# Patient Record
Sex: Female | Born: 1935 | Race: White | Hispanic: No | Smoking: Former smoker
Health system: Southern US, Community
[De-identification: ages and names within clinical notes are randomized; demographics above are authoritative.]

## PROBLEM LIST (undated history)

## (undated) DIAGNOSIS — K219 Gastro-esophageal reflux disease without esophagitis: Secondary | ICD-10-CM

## (undated) DIAGNOSIS — N3281 Overactive bladder: Secondary | ICD-10-CM

## (undated) DIAGNOSIS — H6120 Impacted cerumen, unspecified ear: Secondary | ICD-10-CM

## (undated) DIAGNOSIS — G2581 Restless legs syndrome: Secondary | ICD-10-CM

## (undated) DIAGNOSIS — R059 Cough, unspecified: Secondary | ICD-10-CM

## (undated) DIAGNOSIS — K449 Diaphragmatic hernia without obstruction or gangrene: Secondary | ICD-10-CM

## (undated) DIAGNOSIS — E785 Hyperlipidemia, unspecified: Secondary | ICD-10-CM

## (undated) DIAGNOSIS — B029 Zoster without complications: Secondary | ICD-10-CM

## (undated) DIAGNOSIS — R634 Abnormal weight loss: Secondary | ICD-10-CM

## (undated) DIAGNOSIS — I509 Heart failure, unspecified: Secondary | ICD-10-CM

## (undated) DIAGNOSIS — R05 Cough: Secondary | ICD-10-CM

## (undated) DIAGNOSIS — M1990 Unspecified osteoarthritis, unspecified site: Secondary | ICD-10-CM

## (undated) DIAGNOSIS — C801 Malignant (primary) neoplasm, unspecified: Secondary | ICD-10-CM

## (undated) DIAGNOSIS — R197 Diarrhea, unspecified: Secondary | ICD-10-CM

## (undated) DIAGNOSIS — K8689 Other specified diseases of pancreas: Secondary | ICD-10-CM

## (undated) HISTORY — DX: Overactive bladder: N32.81

## (undated) HISTORY — PX: CATARACT EXTRACTION: SUR2

## (undated) HISTORY — DX: Zoster without complications: B02.9

## (undated) HISTORY — DX: Diaphragmatic hernia without obstruction or gangrene: K44.9

## (undated) HISTORY — DX: Unspecified osteoarthritis, unspecified site: M19.90

## (undated) HISTORY — DX: Cough: R05

## (undated) HISTORY — DX: Malignant (primary) neoplasm, unspecified: C80.1

## (undated) HISTORY — DX: Diarrhea, unspecified: R19.7

## (undated) HISTORY — DX: Heart failure, unspecified: I50.9

## (undated) HISTORY — DX: Cough, unspecified: R05.9

## (undated) HISTORY — DX: Gastro-esophageal reflux disease without esophagitis: K21.9

## (undated) HISTORY — DX: Hyperlipidemia, unspecified: E78.5

## (undated) HISTORY — DX: Restless legs syndrome: G25.81

## (undated) HISTORY — DX: Other specified diseases of pancreas: K86.89

## (undated) HISTORY — DX: Impacted cerumen, unspecified ear: H61.20

## (undated) HISTORY — DX: Abnormal weight loss: R63.4

---

## 1998-08-08 ENCOUNTER — Other Ambulatory Visit: Admission: RE | Admit: 1998-08-08 | Discharge: 1998-08-08 | Payer: Self-pay | Admitting: Internal Medicine

## 1999-08-21 ENCOUNTER — Other Ambulatory Visit: Admission: RE | Admit: 1999-08-21 | Discharge: 1999-08-21 | Payer: Self-pay | Admitting: Internal Medicine

## 2000-08-14 ENCOUNTER — Encounter: Payer: Self-pay | Admitting: Family Medicine

## 2000-08-14 ENCOUNTER — Encounter: Admission: RE | Admit: 2000-08-14 | Discharge: 2000-08-14 | Payer: Self-pay | Admitting: Family Medicine

## 2000-10-06 ENCOUNTER — Encounter: Admission: RE | Admit: 2000-10-06 | Discharge: 2000-10-06 | Payer: Self-pay | Admitting: Family Medicine

## 2000-10-06 ENCOUNTER — Encounter: Payer: Self-pay | Admitting: Family Medicine

## 2001-08-24 ENCOUNTER — Encounter: Payer: Self-pay | Admitting: Family Medicine

## 2001-08-24 ENCOUNTER — Encounter: Admission: RE | Admit: 2001-08-24 | Discharge: 2001-08-24 | Payer: Self-pay | Admitting: Family Medicine

## 2002-09-14 ENCOUNTER — Encounter: Payer: Self-pay | Admitting: Family Medicine

## 2002-09-14 ENCOUNTER — Encounter: Admission: RE | Admit: 2002-09-14 | Discharge: 2002-09-14 | Payer: Self-pay | Admitting: Family Medicine

## 2002-10-11 ENCOUNTER — Other Ambulatory Visit: Admission: RE | Admit: 2002-10-11 | Discharge: 2002-10-11 | Payer: Self-pay | Admitting: Family Medicine

## 2002-11-01 ENCOUNTER — Encounter: Payer: Self-pay | Admitting: Family Medicine

## 2002-11-01 ENCOUNTER — Encounter: Admission: RE | Admit: 2002-11-01 | Discharge: 2002-11-01 | Payer: Self-pay | Admitting: Family Medicine

## 2003-09-16 ENCOUNTER — Encounter: Payer: Self-pay | Admitting: Family Medicine

## 2003-09-16 ENCOUNTER — Encounter: Admission: RE | Admit: 2003-09-16 | Discharge: 2003-09-16 | Payer: Self-pay | Admitting: Family Medicine

## 2004-09-17 ENCOUNTER — Encounter: Admission: RE | Admit: 2004-09-17 | Discharge: 2004-09-17 | Payer: Self-pay | Admitting: Family Medicine

## 2004-10-16 ENCOUNTER — Ambulatory Visit: Payer: Self-pay | Admitting: Family Medicine

## 2004-11-09 ENCOUNTER — Ambulatory Visit: Payer: Self-pay | Admitting: Family Medicine

## 2004-11-09 ENCOUNTER — Encounter: Admission: RE | Admit: 2004-11-09 | Discharge: 2004-11-09 | Payer: Self-pay | Admitting: Family Medicine

## 2005-01-23 ENCOUNTER — Ambulatory Visit: Payer: Self-pay | Admitting: Family Medicine

## 2005-10-04 ENCOUNTER — Ambulatory Visit: Payer: Self-pay | Admitting: Internal Medicine

## 2005-10-09 ENCOUNTER — Encounter: Admission: RE | Admit: 2005-10-09 | Discharge: 2005-10-09 | Payer: Self-pay | Admitting: Family Medicine

## 2005-10-21 ENCOUNTER — Ambulatory Visit: Payer: Self-pay | Admitting: Family Medicine

## 2005-10-21 ENCOUNTER — Other Ambulatory Visit: Admission: RE | Admit: 2005-10-21 | Discharge: 2005-10-21 | Payer: Self-pay | Admitting: Family Medicine

## 2005-10-21 ENCOUNTER — Encounter: Payer: Self-pay | Admitting: Family Medicine

## 2005-10-21 LAB — CONVERTED CEMR LAB: Pap Smear: NORMAL

## 2005-11-19 ENCOUNTER — Ambulatory Visit: Payer: Self-pay | Admitting: Family Medicine

## 2006-09-17 ENCOUNTER — Ambulatory Visit: Payer: Self-pay | Admitting: Family Medicine

## 2006-10-22 ENCOUNTER — Ambulatory Visit: Payer: Self-pay | Admitting: Family Medicine

## 2006-10-27 ENCOUNTER — Encounter: Admission: RE | Admit: 2006-10-27 | Discharge: 2006-10-27 | Payer: Self-pay | Admitting: Family Medicine

## 2006-10-28 LAB — FECAL OCCULT BLOOD, GUAIAC: Fecal Occult Blood: NEGATIVE

## 2006-11-04 ENCOUNTER — Ambulatory Visit: Payer: Self-pay | Admitting: Family Medicine

## 2006-12-08 ENCOUNTER — Ambulatory Visit: Payer: Self-pay | Admitting: Family Medicine

## 2007-06-02 ENCOUNTER — Telehealth (INDEPENDENT_AMBULATORY_CARE_PROVIDER_SITE_OTHER): Payer: Self-pay | Admitting: *Deleted

## 2007-06-08 ENCOUNTER — Telehealth (INDEPENDENT_AMBULATORY_CARE_PROVIDER_SITE_OTHER): Payer: Self-pay | Admitting: *Deleted

## 2007-09-18 ENCOUNTER — Ambulatory Visit: Payer: Self-pay | Admitting: Family Medicine

## 2007-09-23 ENCOUNTER — Encounter: Payer: Self-pay | Admitting: Family Medicine

## 2007-09-23 DIAGNOSIS — M1990 Unspecified osteoarthritis, unspecified site: Secondary | ICD-10-CM | POA: Insufficient documentation

## 2007-09-23 DIAGNOSIS — E7800 Pure hypercholesterolemia, unspecified: Secondary | ICD-10-CM

## 2007-09-23 DIAGNOSIS — M810 Age-related osteoporosis without current pathological fracture: Secondary | ICD-10-CM

## 2007-10-10 HISTORY — PX: CATARACT EXTRACTION: SUR2

## 2007-10-26 ENCOUNTER — Ambulatory Visit: Payer: Self-pay | Admitting: Family Medicine

## 2007-10-26 DIAGNOSIS — G2581 Restless legs syndrome: Secondary | ICD-10-CM | POA: Insufficient documentation

## 2007-10-26 DIAGNOSIS — Z860100 Personal history of colon polyps, unspecified: Secondary | ICD-10-CM | POA: Insufficient documentation

## 2007-10-27 LAB — CONVERTED CEMR LAB
ALT: 17 units/L (ref 0–35)
AST: 25 units/L (ref 0–37)
Albumin: 4.2 g/dL (ref 3.5–5.2)
BUN: 17 mg/dL (ref 6–23)
Basophils Absolute: 0 10*3/uL (ref 0.0–0.1)
Basophils Relative: 0.2 % (ref 0.0–1.0)
CO2: 25 meq/L (ref 19–32)
Calcium: 9.7 mg/dL (ref 8.4–10.5)
Chloride: 108 meq/L (ref 96–112)
Cholesterol: 214 mg/dL (ref 0–200)
Creatinine, Ser: 0.7 mg/dL (ref 0.4–1.2)
Direct LDL: 123.9 mg/dL
Eosinophils Absolute: 0.1 10*3/uL (ref 0.0–0.6)
Eosinophils Relative: 1.3 % (ref 0.0–5.0)
GFR calc Af Amer: 104 mL/min
GFR calc non Af Amer: 86 mL/min
Glucose, Bld: 108 mg/dL — ABNORMAL HIGH (ref 70–99)
HCT: 36.4 % (ref 36.0–46.0)
HDL: 71.5 mg/dL (ref >39.0)
Hemoglobin: 12.6 g/dL (ref 12.0–15.0)
Lymphocytes Relative: 39.9 % (ref 12.0–46.0)
MCHC: 34.8 g/dL (ref 30.0–36.0)
MCV: 89.5 fL (ref 78.0–100.0)
Monocytes Absolute: 0.3 10*3/uL (ref 0.2–0.7)
Monocytes Relative: 7.1 % (ref 3.0–11.0)
Neutro Abs: 2.3 10*3/uL (ref 1.4–7.7)
Neutrophils Relative %: 51.5 % (ref 43.0–77.0)
Phosphorus: 3.4 mg/dL (ref 2.3–4.6)
Platelets: 202 10*3/uL (ref 150–400)
Potassium: 4.3 meq/L (ref 3.5–5.1)
RBC: 4.06 M/uL (ref 3.87–5.11)
RDW: 13.8 % (ref 11.5–14.6)
Sodium: 142 meq/L (ref 135–145)
TSH: 3.35 microintl units/mL (ref 0.35–5.50)
Total CHOL/HDL Ratio: 3
Triglycerides: 76 mg/dL (ref 0–149)
VLDL: 15 mg/dL (ref 0–40)
WBC: 4.5 10*3/uL (ref 4.5–10.5)

## 2007-10-28 ENCOUNTER — Encounter (INDEPENDENT_AMBULATORY_CARE_PROVIDER_SITE_OTHER): Payer: Self-pay | Admitting: *Deleted

## 2007-10-28 LAB — CONVERTED CEMR LAB: Vit D, 1,25-Dihydroxy: 27 — ABNORMAL LOW (ref 30–89)

## 2007-11-19 ENCOUNTER — Encounter: Admission: RE | Admit: 2007-11-19 | Discharge: 2007-11-19 | Payer: Self-pay | Admitting: Family Medicine

## 2007-11-24 ENCOUNTER — Encounter (INDEPENDENT_AMBULATORY_CARE_PROVIDER_SITE_OTHER): Payer: Self-pay | Admitting: *Deleted

## 2007-12-14 ENCOUNTER — Ambulatory Visit: Payer: Self-pay | Admitting: Family Medicine

## 2007-12-15 ENCOUNTER — Encounter: Payer: Self-pay | Admitting: Gastroenterology

## 2007-12-15 ENCOUNTER — Ambulatory Visit: Payer: Self-pay | Admitting: Gastroenterology

## 2007-12-18 LAB — CONVERTED CEMR LAB: Vit D, 1,25-Dihydroxy: 25 — ABNORMAL LOW (ref 30–89)

## 2007-12-25 ENCOUNTER — Encounter: Payer: Self-pay | Admitting: Family Medicine

## 2007-12-25 ENCOUNTER — Ambulatory Visit: Payer: Self-pay | Admitting: Gastroenterology

## 2007-12-25 DIAGNOSIS — K5730 Diverticulosis of large intestine without perforation or abscess without bleeding: Secondary | ICD-10-CM | POA: Insufficient documentation

## 2008-02-01 ENCOUNTER — Ambulatory Visit: Payer: Self-pay | Admitting: Family Medicine

## 2008-02-04 LAB — CONVERTED CEMR LAB: Vit D, 1,25-Dihydroxy: 45 (ref 30–89)

## 2008-04-25 ENCOUNTER — Ambulatory Visit: Payer: Self-pay | Admitting: Family Medicine

## 2008-04-27 LAB — CONVERTED CEMR LAB: Vit D, 1,25-Dihydroxy: 34 (ref 30–89)

## 2008-06-06 ENCOUNTER — Telehealth: Payer: Self-pay | Admitting: Family Medicine

## 2008-07-28 ENCOUNTER — Ambulatory Visit: Payer: Self-pay | Admitting: Family Medicine

## 2008-08-01 LAB — CONVERTED CEMR LAB: Vit D, 1,25-Dihydroxy: 27 — ABNORMAL LOW (ref 30–89)

## 2008-09-06 ENCOUNTER — Ambulatory Visit: Payer: Self-pay | Admitting: Family Medicine

## 2008-10-10 ENCOUNTER — Ambulatory Visit: Payer: Self-pay | Admitting: Family Medicine

## 2008-10-12 LAB — CONVERTED CEMR LAB: Vit D, 1,25-Dihydroxy: 55 (ref 30–89)

## 2008-10-31 ENCOUNTER — Ambulatory Visit: Payer: Self-pay | Admitting: Family Medicine

## 2008-10-31 DIAGNOSIS — E559 Vitamin D deficiency, unspecified: Secondary | ICD-10-CM

## 2008-11-02 LAB — CONVERTED CEMR LAB
ALT: 14 units/L (ref 0–35)
AST: 22 units/L (ref 0–37)
Albumin: 4.1 g/dL (ref 3.5–5.2)
Alkaline Phosphatase: 31 units/L — ABNORMAL LOW (ref 39–117)
BUN: 13 mg/dL (ref 6–23)
Basophils Absolute: 0 10*3/uL (ref 0.0–0.1)
Basophils Relative: 0.3 % (ref 0.0–3.0)
Bilirubin, Direct: 0.1 mg/dL (ref 0.0–0.3)
CO2: 30 meq/L (ref 19–32)
Calcium: 9.6 mg/dL (ref 8.4–10.5)
Chloride: 105 meq/L (ref 96–112)
Cholesterol: 205 mg/dL (ref 0–200)
Creatinine, Ser: 0.8 mg/dL (ref 0.4–1.2)
Direct LDL: 100.2 mg/dL
Eosinophils Absolute: 0.1 10*3/uL (ref 0.0–0.7)
Eosinophils Relative: 1.6 % (ref 0.0–5.0)
GFR calc Af Amer: 89 mL/min
GFR calc non Af Amer: 73 mL/min
Glucose, Bld: 100 mg/dL — ABNORMAL HIGH (ref 70–99)
HCT: 36.6 % (ref 36.0–46.0)
HDL: 83.9 mg/dL (ref >39.0)
Hemoglobin: 12.7 g/dL (ref 12.0–15.0)
Lymphocytes Relative: 44.1 % (ref 12.0–46.0)
MCHC: 34.6 g/dL (ref 30.0–36.0)
MCV: 90.7 fL (ref 78.0–100.0)
Monocytes Absolute: 0.4 10*3/uL (ref 0.1–1.0)
Monocytes Relative: 9 % (ref 3.0–12.0)
Neutro Abs: 1.8 10*3/uL (ref 1.4–7.7)
Neutrophils Relative %: 45 % (ref 43.0–77.0)
Phosphorus: 3.9 mg/dL (ref 2.3–4.6)
Platelets: 187 10*3/uL (ref 150–400)
Potassium: 4 meq/L (ref 3.5–5.1)
RBC: 4.03 M/uL (ref 3.87–5.11)
RDW: 13.1 % (ref 11.5–14.6)
Sodium: 141 meq/L (ref 135–145)
TSH: 4.6 microintl units/mL (ref 0.35–5.50)
Total Bilirubin: 0.4 mg/dL (ref 0.3–1.2)
Total CHOL/HDL Ratio: 2.4
Total Protein: 7.2 g/dL (ref 6.0–8.3)
Triglycerides: 75 mg/dL (ref 0–149)
VLDL: 15 mg/dL (ref 0–40)
WBC: 4 10*3/uL — ABNORMAL LOW (ref 4.5–10.5)

## 2008-11-21 ENCOUNTER — Encounter: Admission: RE | Admit: 2008-11-21 | Discharge: 2008-11-21 | Payer: Self-pay | Admitting: Family Medicine

## 2009-01-23 ENCOUNTER — Ambulatory Visit: Payer: Self-pay | Admitting: Family Medicine

## 2009-01-31 ENCOUNTER — Encounter (INDEPENDENT_AMBULATORY_CARE_PROVIDER_SITE_OTHER): Payer: Self-pay | Admitting: *Deleted

## 2009-01-31 LAB — CONVERTED CEMR LAB: Vit D, 25-Hydroxy: 36 ng/mL (ref 30–89)

## 2009-06-07 ENCOUNTER — Telehealth: Payer: Self-pay | Admitting: Family Medicine

## 2009-07-24 ENCOUNTER — Ambulatory Visit: Payer: Self-pay | Admitting: Family Medicine

## 2009-07-31 LAB — CONVERTED CEMR LAB: Vit D, 25-Hydroxy: 30 ng/mL (ref 30–89)

## 2009-08-30 ENCOUNTER — Ambulatory Visit: Payer: Self-pay | Admitting: Family Medicine

## 2009-09-05 ENCOUNTER — Ambulatory Visit: Payer: Self-pay | Admitting: Family Medicine

## 2009-09-12 ENCOUNTER — Telehealth: Payer: Self-pay | Admitting: Family Medicine

## 2009-10-30 ENCOUNTER — Ambulatory Visit: Payer: Self-pay | Admitting: Family Medicine

## 2009-10-31 ENCOUNTER — Encounter (INDEPENDENT_AMBULATORY_CARE_PROVIDER_SITE_OTHER): Payer: Self-pay | Admitting: *Deleted

## 2009-10-31 LAB — CONVERTED CEMR LAB: Vit D, 25-Hydroxy: 59 ng/mL (ref 30–89)

## 2009-11-06 ENCOUNTER — Ambulatory Visit: Payer: Self-pay | Admitting: Family Medicine

## 2009-11-06 DIAGNOSIS — K219 Gastro-esophageal reflux disease without esophagitis: Secondary | ICD-10-CM

## 2009-11-07 LAB — CONVERTED CEMR LAB
BUN: 14 mg/dL (ref 6–23)
Basophils Absolute: 0 10*3/uL (ref 0.0–0.1)
Basophils Relative: 0.4 % (ref 0.0–3.0)
CO2: 28 meq/L (ref 19–32)
Calcium: 9.3 mg/dL (ref 8.4–10.5)
Chloride: 105 meq/L (ref 96–112)
Creatinine, Ser: 0.7 mg/dL (ref 0.4–1.2)
Eosinophils Absolute: 0 10*3/uL (ref 0.0–0.7)
Eosinophils Relative: 1.1 % (ref 0.0–5.0)
GFR calc non Af Amer: 85.08 mL/min (ref >60)
Glucose, Bld: 104 mg/dL — ABNORMAL HIGH (ref 70–99)
HCT: 36.8 % (ref 36.0–46.0)
Hemoglobin: 12.4 g/dL (ref 12.0–15.0)
Lymphocytes Relative: 42.2 % (ref 12.0–46.0)
Lymphs Abs: 1.8 10*3/uL (ref 0.7–4.0)
MCHC: 33.8 g/dL (ref 30.0–36.0)
MCV: 92.2 fL (ref 78.0–100.0)
Monocytes Absolute: 0.4 10*3/uL (ref 0.1–1.0)
Monocytes Relative: 8.3 % (ref 3.0–12.0)
Neutro Abs: 2.1 10*3/uL (ref 1.4–7.7)
Neutrophils Relative %: 48 % (ref 43.0–77.0)
Platelets: 204 10*3/uL (ref 150.0–400.0)
Potassium: 3.9 meq/L (ref 3.5–5.1)
RBC: 3.99 M/uL (ref 3.87–5.11)
RDW: 13.8 % (ref 11.5–14.6)
Sodium: 141 meq/L (ref 135–145)
TSH: 3.84 microintl units/mL (ref 0.35–5.50)
WBC: 4.3 10*3/uL — ABNORMAL LOW (ref 4.5–10.5)

## 2009-12-06 ENCOUNTER — Encounter: Admission: RE | Admit: 2009-12-06 | Discharge: 2009-12-06 | Payer: Self-pay | Admitting: Family Medicine

## 2009-12-12 ENCOUNTER — Encounter (INDEPENDENT_AMBULATORY_CARE_PROVIDER_SITE_OTHER): Payer: Self-pay | Admitting: *Deleted

## 2010-03-14 ENCOUNTER — Ambulatory Visit: Payer: Self-pay | Admitting: Family Medicine

## 2010-03-14 DIAGNOSIS — N318 Other neuromuscular dysfunction of bladder: Secondary | ICD-10-CM | POA: Insufficient documentation

## 2010-03-14 LAB — CONVERTED CEMR LAB
Bacteria, UA: 0
Bilirubin Urine: NEGATIVE
Blood in Urine, dipstick: NEGATIVE
Casts: 0 /lpf
Glucose, Urine, Semiquant: NEGATIVE
Ketones, urine, test strip: NEGATIVE
Nitrite: NEGATIVE
Protein, U semiquant: NEGATIVE
RBC / HPF: 1
Specific Gravity, Urine: 1.015
Urine crystals, microscopic: 0 /hpf
Urobilinogen, UA: 0.2
WBC Urine, dipstick: NEGATIVE
WBC, UA: 0 cells/hpf
Yeast, UA: 0
pH: 5

## 2010-04-09 ENCOUNTER — Telehealth: Payer: Self-pay | Admitting: Family Medicine

## 2010-04-11 ENCOUNTER — Ambulatory Visit: Payer: Self-pay | Admitting: Family Medicine

## 2010-04-13 ENCOUNTER — Telehealth: Payer: Self-pay | Admitting: Family Medicine

## 2010-07-18 ENCOUNTER — Telehealth: Payer: Self-pay | Admitting: Family Medicine

## 2010-08-09 ENCOUNTER — Telehealth: Payer: Self-pay | Admitting: Family Medicine

## 2010-08-09 DIAGNOSIS — H9319 Tinnitus, unspecified ear: Secondary | ICD-10-CM | POA: Insufficient documentation

## 2010-09-04 ENCOUNTER — Encounter: Payer: Self-pay | Admitting: Family Medicine

## 2010-10-09 ENCOUNTER — Encounter: Payer: Self-pay | Admitting: Family Medicine

## 2010-11-06 ENCOUNTER — Telehealth (INDEPENDENT_AMBULATORY_CARE_PROVIDER_SITE_OTHER): Payer: Self-pay | Admitting: *Deleted

## 2010-11-07 ENCOUNTER — Ambulatory Visit: Payer: Self-pay | Admitting: Family Medicine

## 2010-11-07 LAB — CONVERTED CEMR LAB
ALT: 13 units/L (ref 0–35)
AST: 19 units/L (ref 0–37)
Albumin: 4.2 g/dL (ref 3.5–5.2)
Alkaline Phosphatase: 37 units/L — ABNORMAL LOW (ref 39–117)
BUN: 21 mg/dL (ref 6–23)
Basophils Absolute: 0 10*3/uL (ref 0.0–0.1)
Basophils Relative: 0.2 % (ref 0.0–3.0)
Bilirubin, Direct: 0.1 mg/dL (ref 0.0–0.3)
CO2: 29 meq/L (ref 19–32)
Calcium: 9.6 mg/dL (ref 8.4–10.5)
Chloride: 104 meq/L (ref 96–112)
Cholesterol: 236 mg/dL — ABNORMAL HIGH (ref 0–200)
Creatinine, Ser: 0.9 mg/dL (ref 0.4–1.2)
Direct LDL: 128 mg/dL
Eosinophils Absolute: 0.1 10*3/uL (ref 0.0–0.7)
Eosinophils Relative: 2 % (ref 0.0–5.0)
GFR calc non Af Amer: 66.04 mL/min (ref >60)
Glucose, Bld: 103 mg/dL — ABNORMAL HIGH (ref 70–99)
HCT: 37.1 % (ref 36.0–46.0)
HDL: 82.4 mg/dL (ref >39.00)
Hemoglobin: 12.7 g/dL (ref 12.0–15.0)
Lymphocytes Relative: 43.4 % (ref 12.0–46.0)
Lymphs Abs: 1.6 10*3/uL (ref 0.7–4.0)
MCHC: 34.2 g/dL (ref 30.0–36.0)
MCV: 90.8 fL (ref 78.0–100.0)
Monocytes Absolute: 0.3 10*3/uL (ref 0.1–1.0)
Monocytes Relative: 8.8 % (ref 3.0–12.0)
Neutro Abs: 1.7 10*3/uL (ref 1.4–7.7)
Neutrophils Relative %: 45.6 % (ref 43.0–77.0)
Phosphorus: 3.6 mg/dL (ref 2.3–4.6)
Platelets: 193 10*3/uL (ref 150.0–400.0)
Potassium: 4.3 meq/L (ref 3.5–5.1)
RBC: 4.09 M/uL (ref 3.87–5.11)
RDW: 14.7 % — ABNORMAL HIGH (ref 11.5–14.6)
Sodium: 142 meq/L (ref 135–145)
TSH: 6.7 microintl units/mL — ABNORMAL HIGH (ref 0.35–5.50)
Total Bilirubin: 0.6 mg/dL (ref 0.3–1.2)
Total CHOL/HDL Ratio: 3
Total Protein: 7 g/dL (ref 6.0–8.3)
Triglycerides: 69 mg/dL (ref 0.0–149.0)
VLDL: 13.8 mg/dL (ref 0.0–40.0)
WBC: 3.6 10*3/uL — ABNORMAL LOW (ref 4.5–10.5)

## 2010-11-11 LAB — CONVERTED CEMR LAB: Vit D, 25-Hydroxy: 46 ng/mL (ref 30–89)

## 2010-11-15 ENCOUNTER — Telehealth: Payer: Self-pay | Admitting: Family Medicine

## 2010-11-16 ENCOUNTER — Telehealth (INDEPENDENT_AMBULATORY_CARE_PROVIDER_SITE_OTHER): Payer: Self-pay

## 2010-11-19 ENCOUNTER — Telehealth: Payer: Self-pay | Admitting: Family Medicine

## 2010-11-23 ENCOUNTER — Ambulatory Visit: Payer: Self-pay | Admitting: Family Medicine

## 2010-12-24 ENCOUNTER — Encounter
Admission: RE | Admit: 2010-12-24 | Discharge: 2010-12-24 | Payer: Self-pay | Source: Home / Self Care | Attending: Family Medicine | Admitting: Family Medicine

## 2010-12-24 LAB — HM MAMMOGRAPHY: HM Mammogram: NORMAL

## 2010-12-26 ENCOUNTER — Encounter: Payer: Self-pay | Admitting: Family Medicine

## 2010-12-26 ENCOUNTER — Encounter (INDEPENDENT_AMBULATORY_CARE_PROVIDER_SITE_OTHER): Payer: Self-pay | Admitting: *Deleted

## 2010-12-30 ENCOUNTER — Encounter: Payer: Self-pay | Admitting: Family Medicine

## 2011-01-08 NOTE — Consult Note (Signed)
Summary: [facility]  [facility]   Imported By: Maryln Gottron 09/10/2010 15:07:11  _____________________________________________________________________  External Attachment:    Type:   Image     Comment:   External Document

## 2011-01-08 NOTE — Progress Notes (Signed)
Summary: Escript failed  ---- Converted from flag ---- ---- 11/15/2010 3:28 PM, Lewanda Rife LPN wrote: Failed eScript: Occupational hygienist;  Internal processing error has occurred. Message id - [id number]. Dr Name - Milinda Antis, Colon Flattery. Pharmacy Name - [facility]*;  Pharmacy: [facility]*;  Medication: MOBIC 7.5 MG  TABS;  Instructions: 1 by mouth once daily with food as needed;  Quantity: 30 Tablet;  Refills: 5;  Entry Date: 2010-11-15;  Transaction ID: [id number];   ------------------------------

## 2011-01-08 NOTE — Letter (Signed)
Summary: Personal Health History Form  Personal Health History Form   Imported By: Maryln Gottron 10/15/2010 10:49:30  _____________________________________________________________________  External Attachment:    Type:   Image     Comment:   External Document

## 2011-01-08 NOTE — Assessment & Plan Note (Signed)
Summary: RINGING IN EARS,BLADDER/CLE   Vital Signs:  Patient profile:   77 year old female Height:      60.5 inches Weight:      148.25 pounds BMI:     28.58 Temp:     98.3 degrees F oral Pulse rate:   72 / minute Pulse rhythm:   regular BP sitting:   136 / 78  (left arm) Cuff size:   regular  Vitals Entered By: Lewanda Rife LPN (March 14, 1911 2:12 PM) CC: ringing in ears and frequency of urine at night up 2 and 3 times during night to urinate.   History of Present Illness: her ear is ringing - and is using debrox at home for wax  none has come out -- at least not much L ear - not hurting - no pain at all  just high pitched constant sound -- occ it comes and goes   is urinating a lot at night lately 2-3 times per night  is a good with drinking water  no burning or pain or discomfort   no leaking at all - is greatful for this   otherwise feeling good has been out working in the yard   ua today is clear   Allergies: 1)  Penicillin 2)  Hydrocodone 3)  Nsaids  Past History:  Past Surgical History: Last updated: 12/26/2007 Dexa- osteoporosis (1997) Dexa- OP, some improvement (09/2000) Cataract extraction Colonoscopy- diverticulosis, polyps (12/2002) Dexa (10/2002) Dexa- OP, slight improvement (11/2004) 2nd cataract 11/08 1/09 colonoscopy - diverticulosis (re check 10 y)  Family History: Last updated: 10/31/2008 Siblings: 2 brothers with heart problems, one had CABG, one with pacemaker, one with DM.  sister with DM, HTN brother DM neice with breast ca daughter fibromyalgia  son ETOH   Social History: Last updated: 10/31/2008 Marital Status:widowed Children: 3 Occupation: retired exercises at [employer] Patient has never smoked.  Alcohol Use - no  Risk Factors: Smoking Status: never (12/15/2007)  Past Medical History: Osteoarthritis Osteoporosis hyperlipidemia RLS vitamin D deficiency GERD overactive bladder cerumen impaction recurrent   Review  of Systems General:  Denies chills, fatigue, fever, loss of appetite, and malaise. Eyes:  Denies blurring, discharge, and eye irritation. ENT:  Complains of decreased hearing, ear discharge, postnasal drainage, and ringing in ears; denies earache. CV:  Denies chest pain or discomfort and palpitations. Resp:  Denies cough and wheezing. GI:  Denies nausea and vomiting. GU:  Complains of nocturia and urinary frequency; denies dysuria, hematuria, and incontinence. Derm:  Denies itching, lesion(s), poor wound healing, and rash. Neuro:  Denies numbness, sensation of room spinning, and tingling. Psych:  Denies anxiety and depression. Endo:  Denies cold intolerance, excessive thirst, and heat intolerance. Heme:  Denies abnormal bruising and bleeding. Allergy:  Complains of seasonal allergies.  Physical Exam  General:  Well-developed,well-nourished,in no acute distress; alert,appropriate and cooperative throughout examination Head:  normocephalic, atraumatic, and no abnormalities observed.  no sinus or temporal tenderness  Eyes:  vision grossly intact, pupils equal, pupils round, and pupils reactive to light.  no conjunctival pallor, injection or icterus  no nystagmus  Ears:  cerumen imp L ear- totally cleared with irrigation canals and TMs clear bilat with imp hearing grossly Nose:  no nasal discharge.   Mouth:  pharynx pink and moist.   Neck:  supple with full rom and no masses or thyromegally, no JVD or carotid bruit  Chest Wall:  No deformities, masses, or tenderness noted. Lungs:  Normal respiratory effort, chest  expands symmetrically. Lungs are clear to auscultation, no crackles or wheezes. Heart:  Normal rate and regular rhythm. S1 and S2 normal without gallop, murmur, click, rub or other extra sounds. Abdomen:  no suprapubic tenderness or fullness felt  Msk:  no CVA tenderness  Extremities:  No clubbing, cyanosis, edema, or deformity noted with normal full range of motion of all joints.    Neurologic:  sensation intact to light touch, gait normal, and DTRs symmetrical and normal.   Skin:  Intact without suspicious lesions or rashes Cervical Nodes:  No lymphadenopathy noted Inguinal Nodes:  No significant adenopathy Psych:  normal affect, talkative and pleasant    Impression & Recommendations:  Problem # 1:  CERUMEN IMPACTION, LEFT (ICD-380.4) Assessment Unchanged resolved after use of debrox and then simple ear irrigation per pt ringing stopped and hearing imp immediately will update if no further imp disc tinnitus and may need ENT eval if symptoms return  Problem # 2:  OVERACTIVE BLADDER (ICD-596.51)  with pm symptoms mainly clear ua disc limiting fluids at night trial of vesicare 5 mg -- samples and px disc poss side eff indetail will update   Orders: UA Dipstick W/ Micro (manual) (19147)  Complete Medication List: 1)  Nexium 40 Mg Cpdr (Esomeprazole magnesium) .Marland Kitchen.. 1 by mouth once daily 2)  Mobic 7.5 Mg Tabs (Meloxicam) .... 2 by mouth once daily with food for 1 week then decrease to 1 daily 3)  Evista 60 Mg Tabs (Raloxifene hcl) .... One by mouth once daily 4)  Vitamin D 4000 Units  .... Take by mouth daily 5)  Calcium  6)  Multivitamins Tabs (Multiple vitamin) .... One daily 7)  Vesicare 5 Mg Tabs (Solifenacin succinate) .Marland Kitchen.. 1 by mouth once daily  Patient Instructions: 1)  take vesicare once daily for overactive bladder (whatever time is fine) 2)  side effects include dry mouth/ constipation  3)  see if it helps bladder  4)  here if px to fill if it works  5)  if ear symptoms do not improve in 1-2 weeks - call so I can refer you to ENT doctor for further evaluation  Prescriptions: VESICARE 5 MG TABS (SOLIFENACIN SUCCINATE) 1 by mouth once daily  #30 x 11    Entered and Authorized by:   Judith Part MD   Signed by:   Judith Part MD on 03/14/2010   Method used:   Print then Give to Patient   RxID:   [id number]   Current Allergies  (reviewed today): PENICILLIN HYDROCODONE NSAIDS  Laboratory Results   Urine Tests  Date/Time Received: March 14, 2010 2:13 PM,  Date/Time Reported: March 14, 2010 2:13 PM   Routine Urinalysis   Color: yellow Appearance: Clear Glucose: negative   (Normal Range: Negative) Bilirubin: negative   (Normal Range: Negative) Ketone: negative   (Normal Range: Negative) Spec. Gravity: 1.015   (Normal Range: 1.003-1.035) Blood: negative   (Normal Range: Negative) pH: 5.0   (Normal Range: 5.0-8.0) Protein: negative   (Normal Range: Negative) Urobilinogen: 0.2   (Normal Range: 0-1) Nitrite: negative   (Normal Range: Negative) Leukocyte Esterace: negative   (Normal Range: Negative)  Urine Microscopic WBC/HPF: 0 RBC/HPF: 1 Bacteria/HPF: 0 Mucous/HPF: few Epithelial/HPF: 1-3 Crystals/HPF: 0 Casts/LPF: 0 Yeast/HPF: 0 Other: 0

## 2011-01-08 NOTE — Progress Notes (Signed)
Summary: allergy sxs  Phone Note Call from Patient Call back at Home Phone [phone]   Caller: Patient Call For: Judith Part MD Summary of Call: Pt has clear nasal drainage.  She has tried claritin but that doesnt help.  She asks what allergy meds she can take.  I advised zyrtec or allegra.  She wanted me to  check with you. Initial call taken by: Lowella Petties CMA,  Apr 09, 2010 5:02 PM  Follow-up for Phone Call        either of these are ok to try  Follow-up by: Judith Part MD,  Apr 09, 2010 5:05 PM

## 2011-01-08 NOTE — Progress Notes (Signed)
Summary: mobic  Phone Note Refill Request Message from:  Fax from Pharmacy on November 15, 2010 10:33 AM  Refills Requested: Medication #1:  MOBIC 7.5 MG  TABS 1 by mouth once daily with food [phone].   Initial call taken by: Melody Comas,  November 15, 2010 11:02 AM  Follow-up for Phone Call        px written on EMR for call in  Follow-up by: Judith Part MD,  November 15, 2010 1:06 PM  Additional Follow-up for Phone Call Additional follow up Details #1::        [facility] instructed.Lewanda Rife LPN  November 15, 2010 3:27 PM     New/Updated Medications: MOBIC 7.5 MG  TABS (MELOXICAM) 1 by mouth once daily with food as needed Prescriptions: MOBIC 7.5 MG  TABS (MELOXICAM) 1 by mouth once daily with food as needed  #30 x 5   Entered by:   Lewanda Rife LPN   Authorized by:   Judith Part MD   Signed by:   Lewanda Rife LPN on 86/57/8469   Method used:   Electronically to        [facility]* (mail-order)       [address]       [place], [place]  [address]       Ph: [phone]       Fax: [phone]   RxID:   [id number]

## 2011-01-08 NOTE — Letter (Signed)
Summary: Results Follow up Letter  [place] [facility]  [address] [place], [place] [address]   Phone: [phone]  Fax: [phone]    12/12/2009 MRN: [id number]    Bridget Walker [address] [place], [place]  [address]    Dear [name],  The following are the results of your recent test(s):  Test         Result    Pap Smear:        Normal _____  Not Normal _____ Comments: ______________________________________________________ Cholesterol: LDL(Bad cholesterol):         Your goal is less than:         HDL (Good cholesterol):       Your goal is more than: Comments:  ______________________________________________________ Mammogram:        Normal __X___  Not Normal _____ Comments:  Yearly follow up is recommended.   ___________________________________________________________________ Hemoccult:        Normal _____  Not normal _______ Comments:    _____________________________________________________________________ Other Tests:    We routinely do not discuss normal results over the telephone.  If you desire a copy of the results, or you have any questions about this information we can discuss them at your next office visit.   Sincerely,    Marne A. Milinda Antis, M.D.  MAT:lsf

## 2011-01-08 NOTE — Progress Notes (Signed)
----   Converted from flag ---- ---- 11/06/2010 7:46 AM, Colon Flattery Tower MD wrote: please check vit D and lipid/ hepatic/ renal / cbc with diff / tsh for gerd, 733.0, vit d def and 272 thanks  ---- 10/30/2010 1:32 PM, Liane Comber CMA (AAMA) wrote: Lab orders please! Good Morning! This pt is scheduled for cpx labs Wed, which labs to draw and dx codes to use? Thanks Tasha ------------------------------

## 2011-01-08 NOTE — Progress Notes (Signed)
Summary: cough has worsened   Phone Note Call from Patient Call back at Home Phone [phone]   Caller: Patient Call For: Judith Part MD Summary of Call: Patient says that her cough has gotten worse and is especially bad while lying down. She is asking if she can have something called in to [facility].  Initial call taken by: Melody Comas,  Apr 13, 2010 4:29 PM  Follow-up for Phone Call        she is allergic to hydrocodone so hesitant to use narcotics will try tessalon- swallow whole and do not chew the pills px written on EMR for call in  Follow-up by: Judith Part MD,  Apr 13, 2010 5:17 PM  Additional Follow-up for Phone Call Additional follow up Details #1::        Patient notified as instructed by telephone. Pt said has had slight wheezing but she feels better. Pt to call next week to update how she is doing. Pt to go to [place] [facility] on Sat aftercalling for appt if needed. [facility] as instructed. Lewanda Rife LPN  Apr 14, 1307 5:26 PM     New/Updated Medications: TESSALON 200 MG CAPS (BENZONATATE) 1 by mouth up to three times a day as needed cough Prescriptions: TESSALON 200 MG CAPS (BENZONATATE) 1 by mouth up to three times a day as needed cough  #30 x 0   Entered and Authorized by:   Judith Part MD   Signed by:   Lewanda Rife LPN on 65/78/4696   Method used:   [facility]phoned to ...       [facility] [facility]retail)       [address]       [place], [place]  [address]       Ph: [phone] or [phone]       Fax: [phone]   RxID:   [id number]

## 2011-01-08 NOTE — Progress Notes (Signed)
Summary: needs written scripts for evista, nexium  Phone Note Refill Request Call back at Home Phone [phone] Message from:  Patient  Refills Requested: Medication #1:  EVISTA 60 MG  TABS one by mouth once daily  Medication #2:  NEXIUM 40 MG  CPDR 1 by mouth once daily Pt needs 90 day written scripts for mail order.  Please call when ready, she wants to pick these up tomorrow morning.  Initial call taken by: Lowella Petties CMA,  July 18, 2010 2:48 PM  Follow-up for Phone Call        printed in put in nurse in box for pickup  Follow-up by: Judith Part MD,  July 19, 2010 12:42 PM  Additional Follow-up for Phone Call Additional follow up Details #1::        Left message for patient to call back. Prescription left at front desk. Lewanda Rife LPN  July 19, 2010 12:52 PM   Patient notified.  Additional Follow-up by: Melody Comas,  July 19, 2010 1:39 PM    New/Updated Medications: NEXIUM 40 MG  CPDR (ESOMEPRAZOLE MAGNESIUM) 1 by mouth once daily Prescriptions: NEXIUM 40 MG  CPDR (ESOMEPRAZOLE MAGNESIUM) 1 by mouth once daily  #90 x 3   Entered and Authorized by:   Judith Part MD   Signed by:   Judith Part MD on 07/19/2010   Method used:   Print then Give to Patient   RxID:   [id number] EVISTA 60 MG  TABS (RALOXIFENE HCL) one by mouth once daily  #90 x 3   Entered and Authorized by:   Judith Part MD   Signed by:   Judith Part MD on 07/19/2010   Method used:   Print then Give to Patient   RxID:   [id number] EVISTA 60 MG  TABS (RALOXIFENE HCL) one by mouth once daily  #90 x 3   Entered and Authorized by:   Judith Part MD   Signed by:   Judith Part MD on 07/19/2010   Method used:   Print then Give to Patient   RxID:   [id number]

## 2011-01-08 NOTE — Assessment & Plan Note (Signed)
Summary: SINUS SXS, FEVER   Vital Signs:  Patient profile:   77 year old female Height:      60.5 inches Weight:      146 pounds BMI:     28.15 Temp:     98.1 degrees F oral Pulse rate:   80 / minute Pulse rhythm:   regular BP sitting:   116 / 70  (left arm) Cuff size:   regular  Vitals Entered By: Lewanda Rife LPN (Apr 11, 1609 12:36 PM) CC: sinus dripping, fever , dry cough, head congested earlier, not today.   History of Present Illness: thinks she has a bad cold  runny / stuffy nose and sinuses dripping  L nostril started last thurs followed by other one  today in shower nose opened up some  now is left with a cough- very little productio- ? color  no fever  took some claritin and that did not help  took tylenol no cough med   Allergies: 1)  Penicillin 2)  Hydrocodone 3)  Nsaids  Past History:  Past Medical History: Last updated: 03/14/2010 Osteoarthritis Osteoporosis hyperlipidemia RLS vitamin D deficiency GERD overactive bladder cerumen impaction recurrent   Past Surgical History: Last updated: 12/26/2007 Dexa- osteoporosis (1997) Dexa- OP, some improvement (09/2000) Cataract extraction Colonoscopy- diverticulosis, polyps (12/2002) Dexa (10/2002) Dexa- OP, slight improvement (11/2004) 2nd cataract 11/08 1/09 colonoscopy - diverticulosis (re check 10 y)  Family History: Last updated: 10/31/2008 Siblings: 2 brothers with heart problems, one had CABG, one with pacemaker, one with DM.  sister with DM, HTN brother DM neice with breast ca daughter fibromyalgia  son ETOH   Social History: Last updated: 10/31/2008 Marital Status:widowed Children: 3 Occupation: retired exercises at [employer] Patient has never smoked.  Alcohol Use - no  Risk Factors: Smoking Status: never (12/15/2007)  Review of Systems General:  Complains of fatigue; denies chills, fever, and malaise. Eyes:  Denies blurring, discharge, and eye irritation. ENT:  Complains of  hoarseness, nasal congestion, postnasal drainage, and sore throat; denies earache and sinus pressure. CV:  Denies chest pain or discomfort and palpitations. Resp:  Complains of cough and sputum productive; denies shortness of breath and wheezing. GI:  Denies diarrhea, nausea, and vomiting. Derm:  Denies lesion(s), poor wound healing, and rash.  Physical Exam  General:  Well-developed,well-nourished,in no acute distress; alert,appropriate and cooperative throughout examination Head:  normocephalic, atraumatic, and no abnormalities observed.  no sinus tenderness  Eyes:  vision grossly intact, pupils equal, pupils round, pupils reactive to light, and no injection.   Ears:  R ear normal and L ear normal.   Nose:  nares are injected and congested bilaterally  Mouth:  pharynx pink and moist.   some clear post nasal drip  Neck:  supple with full rom and no masses or thyromegally, no JVD or carotid bruit  Lungs:  Normal respiratory effort, chest expands symmetrically. Lungs are clear to auscultation, no crackles or wheezes. Heart:  Normal rate and regular rhythm. S1 and S2 normal without gallop, murmur, click, rub or other extra sounds. Skin:  Intact without suspicious lesions or rashes Cervical Nodes:  No lymphadenopathy noted Psych:  normal affect, talkative and pleasant    Impression & Recommendations:  Problem # 1:  URI (ICD-465.9) Assessment New with mild cough and b9 exam  recommend sympt care- see pt instructions   pt advised to update me if symptoms worsen or do not improve - esp cough or sinus pain  will try some mucinex  Her updated medication list for this problem includes:    Mobic 7.5 Mg Tabs (Meloxicam) .Marland Kitchen... 1 by mouth once daily with food    Tessalon 200 Mg Caps (Benzonatate) .Marland Kitchen... 1 by mouth up to three times a day as needed cough  Complete Medication List: 1)  Nexium 40 Mg Cpdr (Esomeprazole magnesium) .Marland Kitchen.. 1 by mouth once daily 2)  Mobic 7.5 Mg Tabs (Meloxicam) .Marland Kitchen..  1 by mouth once daily with food 3)  Evista 60 Mg Tabs (Raloxifene hcl) .... One by mouth once daily 4)  Vitamin D 4000 Units  .... Take by mouth daily 5)  Calcium  6)  Multivitamins Tabs (Multiple vitamin) .... One daily 7)  Vesicare 5 Mg Tabs (Solifenacin succinate) .Marland Kitchen.. 1 by mouth once daily 8)  Tessalon 200 Mg Caps (Benzonatate) .Marland Kitchen.. 1 by mouth up to three times a day as needed cough  Patient Instructions: 1)  drink lots of fluids 2)  get some mucinex DM over the counter and take as directed as needed for cough  3)  use nasal saline for nasal congestion as needed 4)  if you get worse cough or sinus pain worsens- call and let me know  5)  if you are not starting to improve in 1 week- let me know   Current Allergies (reviewed today): PENICILLIN HYDROCODONE NSAIDS

## 2011-01-08 NOTE — Progress Notes (Signed)
Summary: Ringing in ears  Phone Note Call from Patient Call back at Home Phone [phone]   Caller: Patient Call For: Judith Part MD Summary of Call: Patient has tried everything that you recommended for the ringing in her ears. Patient is still having the ringing in the ears even after using the Debrox kit, no pain.  Patient wants to know what else she should do?  You can leave a message on her answering machine if she is not there. [facility] Initial call taken by: Sydell Axon LPN,  August 09, 2010 10:23 AM  Follow-up for Phone Call        I want to go ahead and ref her to ENT will do ref for [facility] Follow-up by: Judith Part MD,  August 09, 2010 12:33 PM  Additional Follow-up for Phone Call Additional follow up Details #1::        Patient notified as instructed by telephone. Pt will wait to hear from [name] about referral appt.Lewanda Rife LPN  August 09, 2010 2:38 PM   New Problems: TINNITUS (ICD-388.30)   New Problems: TINNITUS (ICD-388.30)

## 2011-01-10 NOTE — Miscellaneous (Signed)
Summary: Mammogram entry   Clinical Lists Changes  Observations: Added new observation of MAMMO DUE: 01/2012 (12/26/2010 8:16) Added new observation of MAMMOGRAM: normal (12/24/2010 8:16)      Preventive Care Screening  Mammogram:    Date:  12/24/2010    Next Due:  01/2012    Results:  normal

## 2011-01-10 NOTE — Progress Notes (Signed)
Summary: Rx Meloxicam  Phone Note From Pharmacy Call back at [phone]   Caller: [facility] Call For: Dr. Milinda Antis  Summary of Call: Calling to get a new script for Meloxicam 7.5 mg. They need a new script for this along with the directions. Patient has never gotten this from them before.  Fax # [phone] Initial call taken by: Sydell Axon LPN,  November 19, 2010 3:25 PM  Follow-up for Phone Call        printed in put in nurse in box for pickup  Follow-up by: Judith Part MD,  November 19, 2010 3:53 PM  Additional Follow-up for Phone Call Additional follow up Details #1::        [facility] [phone] as instructed.Lewanda Rife LPN  November 19, 2010 4:29 PM     Prescriptions: MOBIC 7.5 MG  TABS (MELOXICAM) 1 by mouth once daily with food as needed  #90 x 3   Entered and Authorized by:   Judith Part MD   Signed by:   Judith Part MD on 11/19/2010   Method used:   Print then Give to Patient   RxID:   [id number]

## 2011-01-10 NOTE — Assessment & Plan Note (Signed)
Summary: CPX AND FLU SHOT/DLO  R/S FROM 12/5   Vital Signs:  Patient profile:   77 year old female Height:      60.5 inches Weight:      142.75 pounds BMI:     27.52 Temp:     98.3 degrees F oral Pulse rate:   76 / minute Pulse rhythm:   regular BP sitting:   122 / 68  (left arm) Cuff size:   regular  Vitals Entered By: Lewanda Rife LPN (November 23, 2010 11:34 AM) CC: CPX LMP [id number]   History of Present Illness: here for check up of chronic medical problems   she is doing well overall  will be moving to [facility] this spring - into independent living  is very active and wants to be proactive about it   wt is down 4 lb with bmi of 27  bp well controllled at 122/60  OP-- dexa was 12/10 on evista-- has been on it over 5 years -- is time to stop it  vit D level is 6 on 4000 international units daily is still doing water exercise at least 2 times per week   colonocs utd untl 2019  (per pt GI said she did not need any more)    lipids  Last Lipid ProfileCholesterol: 236 (11/07/2010 8:57:17 AM)HDL:  82.40 (11/07/2010 8:57:17 AM)LDL:  DEL (10/31/2008 11:08:00 AM)Triglycerides:  Last Liver profileSGOT:  19 (11/07/2010 8:57:17 AM)SPGT:  13 (11/07/2010 8:57:17 AM)T. Bili:  0.6 (11/07/2010 8:57:17 AM)Alk Phos:  37 (11/07/2010 8:57:17 AM)  LDL 128- up a bit    tsh is high at 6.7  poss hypothyroid pt feels fine so may be subclinical - may watch  pap 06 no symptoms or problems  vesicare is working well for nocturia   mam 12/10-- may have already scheduled  self exam -no lumps or problems - not good at checking   flu shot today ptx up to date  /td06 shingles shot 07     Allergies: 1)  Penicillin 2)  Hydrocodone 3)  Nsaids  Past History:  Past Medical History: Last updated: 03/14/2010 Osteoarthritis Osteoporosis hyperlipidemia RLS vitamin D deficiency GERD overactive bladder cerumen impaction recurrent   Past Surgical History: Last updated:  12/26/2007 Dexa- osteoporosis (1997) Dexa- OP, some improvement (09/2000) Cataract extraction Colonoscopy- diverticulosis, polyps (12/2002) Dexa (10/2002) Dexa- OP, slight improvement (11/2004) 2nd cataract 11/08 1/09 colonoscopy - diverticulosis (re check 10 y)  Family History: Last updated: 10/31/2008 Siblings: 2 brothers with heart problems, one had CABG, one with pacemaker, one with DM.  sister with DM, HTN brother DM neice with breast ca daughter fibromyalgia  son ETOH   Social History: Last updated: 10/31/2008 Marital Status:widowed Children: 3 Occupation: retired exercises at [employer] Patient has never smoked.  Alcohol Use - no  Risk Factors: Smoking Status: never (12/15/2007)  Review of Systems General:  Denies fatigue, loss of appetite, and malaise. Eyes:  Denies blurring and eye irritation. CV:  Denies chest pain or discomfort, palpitations, and shortness of breath with exertion. Resp:  Denies cough, shortness of breath, and wheezing. GI:  Denies abdominal pain, change in bowel habits, and indigestion. GU:  Denies abnormal vaginal bleeding, dysuria, and urinary frequency. MS:  Denies joint pain, joint redness, joint swelling, and muscle aches. Derm:  Denies dryness, itching, lesion(s), poor wound healing, and rash. Neuro:  Denies headaches, numbness, and tingling. Psych:  mood is good . Endo:  Denies cold intolerance, excessive thirst, excessive urination, heat intolerance, and  weight change.  Physical Exam  General:  Well-developed,well-nourished,in no acute distress; alert,appropriate and cooperative throughout examination Head:  normocephalic, atraumatic, and no abnormalities observed.   Eyes:  vision grossly intact, pupils equal, pupils round, and pupils reactive to light.  no conjunctival pallor, injection or icterus  Ears:  R ear normal and no external deformities.   Nose:  no nasal discharge.   Mouth:  pharynx pink and moist.   Neck:  supple with full  rom and no masses or thyromegally, no JVD or carotid bruit  Chest Wall:  No deformities, masses, or tenderness noted. Breasts:  No mass, nodules, thickening, tenderness, bulging, retraction, inflamation, nipple discharge or skin changes noted.   Lungs:  Normal respiratory effort, chest expands symmetrically. Lungs are clear to auscultation, no crackles or wheezes. Heart:  Normal rate and regular rhythm. S1 and S2 normal without gallop, murmur, click, rub or other extra sounds. Abdomen:  Bowel sounds positive,abdomen soft and non-tender without masses, organomegaly or hernias noted.  no renal bruits  Msk:  mild kyphosis  no acute joint changes  Pulses:  R and L carotid,radial,femoral,dorsalis pedis and posterior tibial pulses are full and equal bilaterally Extremities:  No clubbing, cyanosis, edema, or deformity noted with normal full range of motion of all joints.   Neurologic:  sensation intact to light touch, gait normal, and DTRs symmetrical and normal.   Skin:  Intact without suspicious lesions or rashes Cervical Nodes:  No lymphadenopathy noted Axillary Nodes:  No palpable lymphadenopathy Inguinal Nodes:  No significant adenopathy Psych:  mentally sharp   Impression & Recommendations:  Problem # 1:  TINNITUS (ICD-388.30) Assessment Unchanged this is stable - pt not in need of hearing aids yet   Problem # 2:  OVERACTIVE BLADDER (ICD-596.51) Assessment: Improved doing very well on vesicare-- px was written for mail order when needed   Problem # 3:  UNSPECIFIED VITAMIN D DEFICIENCY (ICD-268.9) Assessment: Improved this is in good control with 4000 international units daily- pt is compliant with it  no problems disc imp to bone and overall health  Problem # 4:  HYPERCHOLESTEROLEMIA (ICD-272.0) Assessment: Deteriorated  this is up just a bit -- LDL still under 130 did review low sat fat diet and what to avoid  rev lab with pt  Labs Reviewed: SGOT: 19 (11/07/2010)   SGPT: 13  (11/07/2010)   HDL:82.40 (11/07/2010), 83.9 (10/31/2008)  LDL:DEL (10/31/2008), DEL (10/26/2007)  Chol:236 (11/07/2010), 205 (10/31/2008)  Trig:69.0 (11/07/2010), 75 (10/31/2008)  Problem # 5:  OSTEOPOROSIS (ICD-733.00) Assessment: Unchanged utd dexa off evista-- has been on for 5 years- disc data enc exercise/ ca and vit d plan dexa in 1year disc safety The following medications were removed from the medication list:    Evista 60 Mg Tabs (Raloxifene hcl) ..... One by mouth once daily  Complete Medication List: 1)  Nexium 40 Mg Cpdr (Esomeprazole magnesium) .Marland Kitchen.. 1 by mouth once daily 2)  Mobic 7.5 Mg Tabs (Meloxicam) .Marland Kitchen.. 1 by mouth once daily with food as needed 3)  Vitamin D 4000 Units  .... Take by mouth daily 4)  Calcium  5)  Multivitamins Tabs (Multiple vitamin) .... One daily 6)  Vesicare 5 Mg Tabs (Solifenacin succinate) .Marland Kitchen.. 1 by mouth once daily  Other Orders: Flu Vaccine 82yrs + MEDICARE PATIENTS (Z6109) Administration Flu vaccine - MCR (U0454)  Patient Instructions: 1)  stop your evista after you finish this px  2)  I will take it off your medicine list  3)  don't  forget to make sure your yearly mammogram is scheduled 4)  keep up the good exercise and try to stick to a healthy diet  5)  had  Prescriptions: VESICARE 5 MG TABS (SOLIFENACIN SUCCINATE) 1 by mouth once daily  #90 x 3   Entered and Authorized by:   Judith Part MD   Signed by:   Judith Part MD on 11/23/2010   Method used:   Print then Give to Patient   RxID:   [id number] NEXIUM 40 MG  CPDR (ESOMEPRAZOLE MAGNESIUM) 1 by mouth once daily  #90 x 3   Entered and Authorized by:   Judith Part MD   Signed by:   Judith Part MD on 11/23/2010   Method used:   Print then Give to Patient   RxID:   [id number]    Orders Added: 1)  Flu Vaccine 87yrs + MEDICARE PATIENTS [Q2039] 2)  Administration Flu vaccine - MCR [G0008] 3)  Est. Patient Level IV [23557]    Current Allergies  (reviewed today): PENICILLIN HYDROCODONE NSAIDS     Flu Vaccine Consent Questions     Do you have a history of severe allergic reactions to this vaccine? no    Any prior history of allergic reactions to egg and/or gelatin? no    Do you have a sensitivity to the preservative Thimersol? no    Do you have a past history of Guillan-Barre Syndrome? no    Do you currently have an acute febrile illness? no    Have you ever had a severe reaction to latex? no    Vaccine information given and explained to patient? yes    Are you currently pregnant? no    Lot Number:[id number]   Exp Date:06/08/2011   Site Given  Left Deltoid [id number] Lewanda Rife LPN  November 23, 2010 2:46 PM

## 2011-01-10 NOTE — Letter (Signed)
Summary: Results Follow up Letter  [place] [facility]  [address] [place], [place] [address]   Phone: [phone]  Fax: [phone]    12/26/2010 MRN: [id number]  TUANA HOHEISEL [address] [place], [place]  [address]  Dear Ms. Stgermain,  The following are the results of your recent test(s):  Test         Result    Pap Smear:        Normal _____  Not Normal _____ Comments: ______________________________________________________ Cholesterol: LDL(Bad cholesterol):         Your goal is less than:         HDL (Good cholesterol):       Your goal is more than: Comments:  ______________________________________________________ Mammogram:        Normal __X___  Not Normal _____ Comments: Repeat in 1 year  ___________________________________________________________________ Hemoccult:        Normal _____  Not normal _______ Comments:    _____________________________________________________________________ Other Tests:    We routinely do not discuss normal results over the telephone.  If you desire a copy of the results, or you have any questions about this information we can discuss them at your next office visit.   Sincerely,       Dr.Marne [facility]

## 2011-04-08 ENCOUNTER — Other Ambulatory Visit: Payer: Self-pay | Admitting: *Deleted

## 2011-04-08 MED ORDER — MELOXICAM 7.5 MG PO TABS: ORAL_TABLET | ORAL | Status: DC

## 2011-04-08 NOTE — Telephone Encounter (Signed)
Px written for call in   

## 2011-04-09 NOTE — Telephone Encounter (Signed)
Pt needs written rx please.

## 2011-04-10 MED ORDER — MELOXICAM 7.5 MG PO TABS: ORAL_TABLET | ORAL | Status: DC

## 2011-04-10 NOTE — Telephone Encounter (Signed)
Px printed for pick up in IN box  

## 2011-04-10 NOTE — Telephone Encounter (Signed)
Patient notified as instructed by telephone. Prescription left at front desk.  

## 2011-07-01 ENCOUNTER — Telehealth: Payer: Self-pay | Admitting: *Deleted

## 2011-07-01 NOTE — Telephone Encounter (Signed)
Done - in IN box in envelope thanks

## 2011-07-01 NOTE — Telephone Encounter (Signed)
Patient dropped off DNR to be filled out. DNR is on your shelf.

## 2011-07-02 NOTE — Telephone Encounter (Signed)
Left vm for pt to callback 

## 2011-07-03 NOTE — Telephone Encounter (Signed)
Patient notified as instructed by telephone. formsleft at front desk.

## 2011-09-11 ENCOUNTER — Ambulatory Visit (INDEPENDENT_AMBULATORY_CARE_PROVIDER_SITE_OTHER): Payer: Medicare Other

## 2011-09-11 DIAGNOSIS — Z23 Encounter for immunization: Secondary | ICD-10-CM

## 2011-11-19 ENCOUNTER — Telehealth: Payer: Self-pay | Admitting: Family Medicine

## 2011-11-19 DIAGNOSIS — M810 Age-related osteoporosis without current pathological fracture: Secondary | ICD-10-CM

## 2011-11-19 DIAGNOSIS — K219 Gastro-esophageal reflux disease without esophagitis: Secondary | ICD-10-CM

## 2011-11-19 DIAGNOSIS — E7800 Pure hypercholesterolemia, unspecified: Secondary | ICD-10-CM

## 2011-11-19 DIAGNOSIS — E559 Vitamin D deficiency, unspecified: Secondary | ICD-10-CM

## 2011-11-19 NOTE — Telephone Encounter (Signed)
Message copied by Judy Pimple on Tue Nov 19, 2011  2:04 PM ------      Message from: Alvina Chou      Created: Tue Nov 12, 2011  4:07 PM      Regarding: orders for 11-20-11       Patient is scheduled for CPX labs, please order future labs, Thanks , Camelia Eng

## 2011-11-20 ENCOUNTER — Other Ambulatory Visit (INDEPENDENT_AMBULATORY_CARE_PROVIDER_SITE_OTHER): Payer: Medicare Other

## 2011-11-20 DIAGNOSIS — K219 Gastro-esophageal reflux disease without esophagitis: Secondary | ICD-10-CM

## 2011-11-20 DIAGNOSIS — E559 Vitamin D deficiency, unspecified: Secondary | ICD-10-CM

## 2011-11-20 DIAGNOSIS — E7800 Pure hypercholesterolemia, unspecified: Secondary | ICD-10-CM

## 2011-11-20 DIAGNOSIS — M810 Age-related osteoporosis without current pathological fracture: Secondary | ICD-10-CM

## 2011-11-20 LAB — CBC WITH DIFFERENTIAL/PLATELET
Basophils Absolute: 0 10*3/uL (ref 0.0–0.1)
Basophils Relative: 0.2 % (ref 0.0–3.0)
Eosinophils Absolute: 0.1 10*3/uL (ref 0.0–0.7)
Eosinophils Relative: 2.3 % (ref 0.0–5.0)
HCT: 35.7 % — ABNORMAL LOW (ref 36.0–46.0)
Hemoglobin: 11.6 g/dL — ABNORMAL LOW (ref 12.0–15.0)
Lymphs Abs: 1.6 10*3/uL (ref 0.7–4.0)
MCHC: 32.6 g/dL (ref 30.0–36.0)
Monocytes Absolute: 0.5 10*3/uL (ref 0.1–1.0)
Monocytes Relative: 7.8 % (ref 3.0–12.0)
Neutro Abs: 3.9 10*3/uL (ref 1.4–7.7)
Neutrophils Relative %: 63.3 % (ref 43.0–77.0)
RBC: 4.1 Mil/uL (ref 3.87–5.11)
RDW: 15.1 % — ABNORMAL HIGH (ref 11.5–14.6)
WBC: 6.2 10*3/uL (ref 4.5–10.5)

## 2011-11-20 LAB — LIPID PANEL
Cholesterol: 231 mg/dL — ABNORMAL HIGH (ref 0–200)
HDL: 77.1 mg/dL (ref >39.00)
Total CHOL/HDL Ratio: 3
Triglycerides: 78 mg/dL (ref 0.0–149.0)
VLDL: 15.6 mg/dL (ref 0.0–40.0)

## 2011-11-20 LAB — COMPREHENSIVE METABOLIC PANEL
Alkaline Phosphatase: 47 U/L (ref 39–117)
BUN: 20 mg/dL (ref 6–23)
CO2: 28 mEq/L (ref 19–32)
Creatinine, Ser: 0.8 mg/dL (ref 0.4–1.2)
GFR: 74.72 mL/min (ref >60.00)
Glucose, Bld: 106 mg/dL — ABNORMAL HIGH (ref 70–99)
Total Bilirubin: 0.4 mg/dL (ref 0.3–1.2)
Total Protein: 7.2 g/dL (ref 6.0–8.3)

## 2011-11-27 ENCOUNTER — Other Ambulatory Visit: Payer: Self-pay | Admitting: Family Medicine

## 2011-11-27 DIAGNOSIS — Z1231 Encounter for screening mammogram for malignant neoplasm of breast: Secondary | ICD-10-CM

## 2011-11-29 ENCOUNTER — Ambulatory Visit (INDEPENDENT_AMBULATORY_CARE_PROVIDER_SITE_OTHER): Payer: Medicare Other | Admitting: Family Medicine

## 2011-11-29 ENCOUNTER — Encounter: Payer: Self-pay | Admitting: Family Medicine

## 2011-11-29 VITALS — BP 124/72 | HR 80 | Temp 98.1°F | Ht 60.25 in | Wt 132.5 lb

## 2011-11-29 DIAGNOSIS — E559 Vitamin D deficiency, unspecified: Secondary | ICD-10-CM

## 2011-11-29 DIAGNOSIS — K219 Gastro-esophageal reflux disease without esophagitis: Secondary | ICD-10-CM

## 2011-11-29 DIAGNOSIS — D649 Anemia, unspecified: Secondary | ICD-10-CM

## 2011-11-29 DIAGNOSIS — E7800 Pure hypercholesterolemia, unspecified: Secondary | ICD-10-CM

## 2011-11-29 DIAGNOSIS — M810 Age-related osteoporosis without current pathological fracture: Secondary | ICD-10-CM

## 2011-11-29 NOTE — Patient Instructions (Signed)
We will schedule dexa at check out (bone density)- continue your calcium and vitamin d Avoid red meat/ fried foods/ egg yolks/ fatty breakfast meats/ butter, cheese and high fat dairy/ and shellfish   You are slightly anemic -so please eat some more iron rich foods like deep leafy greens Schedule non fasting lab for 3 months for anemia -to check iron level  Keep up the exercise

## 2011-11-29 NOTE — Assessment & Plan Note (Signed)
Due for dexa Would consider medication if bmd goes down Thankfully no fractures Is good with ca and D and safety

## 2011-11-29 NOTE — Assessment & Plan Note (Signed)
New and very mild No bowel or GI symptoms Pt confesses not eating as much / less iron Will re check this in 3 mo Pt will inc iron in diet  If worse or wt loss we will eval further

## 2011-11-29 NOTE — Assessment & Plan Note (Signed)
Doing fine with nexium- will continue this and good diet

## 2011-11-29 NOTE — Progress Notes (Signed)
Subjective:    Patient ID: Bridget Walker, female    DOB: 1925/04/03, 77 y.o.   MRN: [id number]  HPI Here for check up of chronic medical conditions and to review health mt list   Is now living at [facility] at an apt  She really likes it  Can use the pool and exercise rooms without going outside in the bad weather  Making lots of friends and a lot of social activities  Likes to get out and still drives  It was a very dramatic change  No new medical problems and not on a lot of medicine    bp is 124/72- very good   Wt is down 10 lb with bmi of 25- she is sure that was from the stress of moving Also does lots and lots of exercise and walks on the trails  Was intentionally loosing weight Still does her own cooking - eating the same  Also mildly anemic with hb of 11.6-- she thinks it is from giving up red meat and does not eat high iron foods at all  Lots of fruits and cheerios in am  Does eat leaf lettuce  Last colonoscopy 1/09 showed diverticulosis   Has not had any gyn symptoms  No symptoms or hx of abn paps   Has had cataract surgery   Mam 1/12- already made appt in jan  Self exam -no lumps or changes (not good about checking that)  Is up to date on imms  GERd is well controlled with nexium No stool changes   Lipids are ok  Lab Results  Component Value Date   CHOL 231* 11/20/2011   CHOL 236* 11/07/2010   CHOL 205* 10/31/2008   Lab Results  Component Value Date   HDL 77.10 11/20/2011   HDL 82.40 11/07/2010   HDL 83.9 10/31/2008   No results found for this basename: [facility]   Lab Results  Component Value Date   TRIG 78.0 11/20/2011   TRIG 69.0 11/07/2010   TRIG 75 10/31/2008   Lab Results  Component Value Date   CHOLHDL 3 11/20/2011   CHOLHDL 3 11/07/2010   CHOLHDL 2.4 CALC 10/31/2008   Lab Results  Component Value Date   LDLDIRECT 137.0 11/20/2011   LDLDIRECT 128.0 11/07/2010   LDLDIRECT 100.2 10/31/2008   fair - diet is fair - she loves ice cream  unfortunately  HDL -- is very high   OP Takes 2000 iu vit D twice daily  Also ca and mvi  Last dexa 12/10 Is interested in bone density   Patient Active Problem List  Diagnoses  . UNSPECIFIED VITAMIN D DEFICIENCY  . HYPERCHOLESTEROLEMIA  . RESTLESS LEG SYNDROME  . TINNITUS  . GERD  . DIVERTICULOSIS OF COLON  . OVERACTIVE BLADDER  . OSTEOARTHRITIS  . OSTEOPOROSIS  . COLONIC POLYPS, HX OF  . Anemia   Past Medical History  Diagnosis Date  . Arthritis     OA  . Osteoporosis   . Hyperlipidemia   . RLS (restless legs syndrome)   . Vitamin D deficiency   . GERD (gastroesophageal reflux disease)   . Overactive bladder   . Cerumen impaction     recurrent   Past Surgical History  Procedure Date  . Cataract extraction   . Cataract extraction 11/08    2nd cataract   History  Substance Use Topics  . Smoking status: Former Games developer  . Smokeless tobacco: Not on file  . Alcohol Use: No   Family  History  Problem Relation Age of Onset  . Diabetes Sister   . Hypertension Sister   . Heart disease Brother   . Fibromyalgia Daughter   . Alcohol abuse Son   . Heart disease Brother   . Diabetes Brother    Allergies  Allergen Reactions  . Hydrocodone     REACTION: diarrhea  . Nsaids     REACTION: GI upset  . Penicillins     REACTION: rash   Current Outpatient Prescriptions on File Prior to Visit  Medication Sig Dispense Refill  . meloxicam (MOBIC) 7.5 MG tablet Take one by mouth once daily with food if needed.  90 tablet  3           Review of Systems Review of Systems  Constitutional: Negative for fever, appetite change, fatigue and unexpected weight change.  Eyes: Negative for pain and visual disturbance.  Respiratory: Negative for cough and shortness of breath.   Cardiovascular: Negative for cp or palpitations    Gastrointestinal: Negative for nausea, diarrhea and constipation.  Genitourinary: Negative for urgency and frequency.  Skin: Negative for pallor  or rash  pos for dry skin in the winter  Neurological: Negative for weakness, light-headedness, numbness and headaches.  Hematological: Negative for adenopathy. Does not bruise/bleed easily.  Psychiatric/Behavioral: Negative for dysphoric mood. The patient is not nervous/anxious.          Objective:   Physical Exam  Constitutional: She appears well-developed and well-nourished. No distress.  HENT:  Head: Normocephalic and atraumatic.  Right Ear: External ear normal.  Left Ear: External ear normal.  Nose: Nose normal.  Mouth/Throat: Oropharynx is clear and moist.  Eyes: Conjunctivae and EOM are normal. Pupils are equal, round, and reactive to light. No scleral icterus.  Neck: Normal range of motion. Neck supple. No JVD present. Carotid bruit is not present. No thyromegaly present.  Cardiovascular: Normal rate, regular rhythm, normal heart sounds and intact distal pulses.  Exam reveals no gallop.   Pulmonary/Chest: Effort normal and breath sounds normal. No respiratory distress. She has no wheezes.  Abdominal: Soft. Bowel sounds are normal. She exhibits no distension, no abdominal bruit and no mass. There is no tenderness.  Genitourinary: No breast swelling, tenderness, discharge or bleeding.       Breast exam: No mass, nodules, thickening, tenderness, bulging, retraction, inflamation, nipple discharge or skin changes noted.  No axillary or clavicular LA.  Chaperoned exam.    Musculoskeletal: Normal range of motion. She exhibits no edema and no tenderness.  Lymphadenopathy:    She has no cervical adenopathy.  Neurological: She is alert. She has normal reflexes. She displays no atrophy and no tremor. No cranial nerve deficit. She exhibits normal muscle tone. Coordination normal.  Skin: Skin is warm and dry. No rash noted. No erythema. No pallor.  Psychiatric: She has a normal mood and affect.       Very mentally sharp and talkative           Assessment & Plan:

## 2011-11-29 NOTE — Assessment & Plan Note (Signed)
Better after inc otc intake -- takes 4000-6000 iu daily

## 2011-11-29 NOTE — Assessment & Plan Note (Signed)
Cholesterol is fair - up a bit Great HDL due to the exercise Disc goals for lipids and reasons to control them Rev labs with pt Rev low sat fat diet in detail

## 2011-12-12 DIAGNOSIS — H2640 Unspecified secondary cataract: Secondary | ICD-10-CM | POA: Diagnosis not present

## 2011-12-12 DIAGNOSIS — H26499 Other secondary cataract, unspecified eye: Secondary | ICD-10-CM | POA: Diagnosis not present

## 2011-12-18 ENCOUNTER — Other Ambulatory Visit: Payer: Self-pay | Admitting: Internal Medicine

## 2011-12-18 MED ORDER — MELOXICAM 7.5 MG PO TABS: ORAL_TABLET | ORAL | Status: DC

## 2011-12-18 MED ORDER — ESOMEPRAZOLE MAGNESIUM 40 MG PO CPDR: 40.0000 mg | DELAYED_RELEASE_CAPSULE | Freq: Every day | ORAL | Status: DC

## 2011-12-18 NOTE — Telephone Encounter (Signed)
Patient is coming by tomorrow for her Rx's to be faxed to her pharmacy wasn't sure which mail order pharmacy it was.  She needs a refill on Nexium and her Meloxicam is this Ok to refill.  I informed her I would help her out so she could get her medication faster I would fax them in for her and put her pharmacy in her chart.  Please advise.  She did say she forgot to mention it to you when she was here for her appointment in December.

## 2011-12-18 NOTE — Telephone Encounter (Signed)
Px printed for pick up in IN box  

## 2011-12-26 ENCOUNTER — Ambulatory Visit (INDEPENDENT_AMBULATORY_CARE_PROVIDER_SITE_OTHER): Payer: Medicare Other | Admitting: Family Medicine

## 2011-12-26 ENCOUNTER — Encounter: Payer: Self-pay | Admitting: Family Medicine

## 2011-12-26 DIAGNOSIS — B029 Zoster without complications: Secondary | ICD-10-CM

## 2011-12-26 DIAGNOSIS — B0239 Other herpes zoster eye disease: Secondary | ICD-10-CM

## 2011-12-26 DIAGNOSIS — B0230 Zoster ocular disease, unspecified: Secondary | ICD-10-CM

## 2011-12-26 MED ORDER — VALACYCLOVIR HCL 1 G PO TABS: 1000.0000 mg | ORAL_TABLET | Freq: Three times a day (TID) | ORAL | Status: DC

## 2011-12-26 NOTE — Progress Notes (Signed)
  Patient Name: Bridget Walker Date of Birth: July 20, 1925 Age: 78 y.o. Medical Record Number: [id number] Gender: female Date of Encounter: 12/26/2011  History of Present Illness:  Bridget Walker is a 78 y.o. very pleasant female patient who presents with the following:  Pleasant lady who 1-2 days ago, had outbreak on L upper face, around eye and in lower L scalp. Some mild burning and pain with few vesicles. Now eye is burning and bothering her.   No other new derm exposures. Not itchy. Felt a little achy diffusely starting last fri.  Past Medical History, Surgical History, Social History, Family History, Problem List, Medications, and Allergies have been reviewed and updated if relevant.  Review of Systems: ROS: GEN: Acute illness details above GI: Tolerating PO intake GU: maintaining adequate hydration and urination Pulm: No SOB Interactive and getting along well at home.  Otherwise, ROS is as per the HPI.   Physical Examination: Filed Vitals:   12/26/11 1126  BP: 130/72  Pulse: 88  Temp: 98 F (36.7 C)  TempSrc: Oral  Height: 5' 0.25" (1.53 m)  Weight: 129 lb (58.514 kg)  SpO2: 98%    Body mass index is 24.98 kg/(m^2).   GEN: WDWN, NAD, Non-toxic, Alert & Oriented x 3 HEENT: Atraumatic, Normocephalic.  Ears and Nose: No external deformity. L eye, conj more injected. PERRLA. EOMI EXTR: No clubbing/cyanosis/edema NEURO: Normal gait.  PSYCH: Normally interactive. Conversant. Not depressed or anxious appearing.  Calm demeanor.  Skin, reddish appearance to L upper face, approaching eye with few vesicles   Assessment and Plan: 1. Shingles    2. Zoster ophthalmicus  valACYclovir (VALTREX) 1000 MG tablet    Concern for early shingles - cannot exclude eye involvement. Called for opth eval today - appreciate assistance.

## 2011-12-26 NOTE — Patient Instructions (Signed)
REFERRAL: GO THE THE FRONT ROOM AT THE ENTRANCE OF OUR CLINIC, NEAR CHECK IN. ASK FOR Bridget Walker. SHE WILL HELP YOU SET UP YOUR REFERRAL. DATE: TIME:  

## 2011-12-30 DIAGNOSIS — B029 Zoster without complications: Secondary | ICD-10-CM

## 2012-01-01 ENCOUNTER — Ambulatory Visit: Payer: Medicare Other

## 2012-01-01 ENCOUNTER — Other Ambulatory Visit: Payer: Medicare Other

## 2012-01-01 DIAGNOSIS — B0233 Zoster keratitis: Secondary | ICD-10-CM | POA: Diagnosis not present

## 2012-01-01 DIAGNOSIS — B0232 Zoster iridocyclitis: Secondary | ICD-10-CM | POA: Diagnosis not present

## 2012-01-15 DIAGNOSIS — B0232 Zoster iridocyclitis: Secondary | ICD-10-CM | POA: Diagnosis not present

## 2012-02-05 DIAGNOSIS — H409 Unspecified glaucoma: Secondary | ICD-10-CM | POA: Diagnosis not present

## 2012-02-05 DIAGNOSIS — B0232 Zoster iridocyclitis: Secondary | ICD-10-CM | POA: Diagnosis not present

## 2012-02-05 DIAGNOSIS — B0239 Other herpes zoster eye disease: Secondary | ICD-10-CM | POA: Diagnosis not present

## 2012-02-05 DIAGNOSIS — H4040X0 Glaucoma secondary to eye inflammation, unspecified eye, stage unspecified: Secondary | ICD-10-CM | POA: Diagnosis not present

## 2012-02-10 ENCOUNTER — Telehealth: Payer: Self-pay | Admitting: Family Medicine

## 2012-02-10 NOTE — Telephone Encounter (Signed)
[name],nurse @ [name],saw patient today.  Patient has been having off and on cough sometimes productive w/ dark yellow sputum.  Patient has drainage and mild nasal congestion.  Patient isn't running a fever.  Patient's lungs are clear.  Patient would like to take Claritin D over the counter or if you would recommend any other treatment at this point.

## 2012-02-10 NOTE — Telephone Encounter (Signed)
[name] notified as instructed by telephone.

## 2012-02-10 NOTE — Telephone Encounter (Signed)
claritin ok - not D (this can cause nervousness/ trouble sleeping/ inc bp)  Would f/u this week for eval if not improving or if fever or worse Plain mucinex would be ok too to loosen phlegm

## 2012-02-12 ENCOUNTER — Other Ambulatory Visit: Payer: Medicare Other

## 2012-02-12 ENCOUNTER — Ambulatory Visit: Payer: Medicare Other

## 2012-02-26 ENCOUNTER — Other Ambulatory Visit: Payer: Medicare Other

## 2012-02-26 ENCOUNTER — Ambulatory Visit
Admission: RE | Admit: 2012-02-26 | Discharge: 2012-02-26 | Disposition: A | Discharge status: Home / Self Care | Payer: Medicare Other | Source: Ambulatory Visit | Attending: Family Medicine | Admitting: Family Medicine

## 2012-02-26 ENCOUNTER — Other Ambulatory Visit: Payer: Medicare Other | Admitting: Family Medicine

## 2012-02-26 DIAGNOSIS — M810 Age-related osteoporosis without current pathological fracture: Secondary | ICD-10-CM | POA: Diagnosis not present

## 2012-02-26 DIAGNOSIS — Z1231 Encounter for screening mammogram for malignant neoplasm of breast: Secondary | ICD-10-CM

## 2012-03-02 ENCOUNTER — Encounter: Payer: Self-pay | Admitting: *Deleted

## 2012-03-02 ENCOUNTER — Encounter: Payer: Self-pay | Admitting: Family Medicine

## 2012-03-04 DIAGNOSIS — H409 Unspecified glaucoma: Secondary | ICD-10-CM | POA: Diagnosis not present

## 2012-03-04 DIAGNOSIS — H4040X0 Glaucoma secondary to eye inflammation, unspecified eye, stage unspecified: Secondary | ICD-10-CM | POA: Diagnosis not present

## 2012-03-04 DIAGNOSIS — B0239 Other herpes zoster eye disease: Secondary | ICD-10-CM | POA: Diagnosis not present

## 2012-03-04 DIAGNOSIS — B0232 Zoster iridocyclitis: Secondary | ICD-10-CM | POA: Diagnosis not present

## 2012-03-16 ENCOUNTER — Telehealth: Payer: Self-pay | Admitting: Family Medicine

## 2012-03-16 NOTE — Telephone Encounter (Signed)
Caller: Carlin/Patient; PCP: [name] A.; CB#: [phone]. Caller reports she had outbreak of Shingles in Feb and March. Cold symptoms that are now resolving. No appetite, still losing weight, current weight fully dressed/with shoes is 120.2. Caller reports she drinks a lot of juice. Caller given info re juices suppressing appetite. Urine is bright yellow and dark looking. Caller has no sxs of illness and is doing well otherwise. Caller given info re increasing fluids/water. She is agreeable. Caller asking if she needs to be seen in the office. Caller given info and advised it does not sound like she needs to be seen. Advised a note will be sent for PCP and IF she has further advice, she will get a callback. Please leave message if she does not answer phone.   PLS CALL MRS Liskey if MD HAS ADDITIONAL INFO/WOULD LIKE TO SEE HER.

## 2012-03-17 ENCOUNTER — Telehealth: Payer: Self-pay | Admitting: Family Medicine

## 2012-03-17 NOTE — Telephone Encounter (Signed)
Since she is not getting appetite back and loosing weight - I would like her to follow up some time this week just to check her out  Thanks

## 2012-03-17 NOTE — Telephone Encounter (Signed)
Patient advised as instructed via telephone, scheduled f/u appt for tomorrow.

## 2012-03-18 ENCOUNTER — Ambulatory Visit (INDEPENDENT_AMBULATORY_CARE_PROVIDER_SITE_OTHER): Payer: Medicare Other | Admitting: Family Medicine

## 2012-03-18 ENCOUNTER — Encounter: Payer: Self-pay | Admitting: Family Medicine

## 2012-03-18 VITALS — BP 130/60 | HR 66 | Temp 98.0°F | Ht 60.25 in | Wt 117.8 lb

## 2012-03-18 DIAGNOSIS — R634 Abnormal weight loss: Secondary | ICD-10-CM | POA: Diagnosis not present

## 2012-03-18 DIAGNOSIS — R5381 Other malaise: Secondary | ICD-10-CM | POA: Diagnosis not present

## 2012-03-18 DIAGNOSIS — R5383 Other fatigue: Secondary | ICD-10-CM

## 2012-03-18 DIAGNOSIS — R630 Anorexia: Secondary | ICD-10-CM | POA: Diagnosis not present

## 2012-03-18 DIAGNOSIS — R17 Unspecified jaundice: Secondary | ICD-10-CM | POA: Diagnosis not present

## 2012-03-18 LAB — HEPATIC FUNCTION PANEL
ALT: 242 U/L — ABNORMAL HIGH (ref 0–35)
AST: 271 U/L — ABNORMAL HIGH (ref 0–37)
Alkaline Phosphatase: 458 U/L — ABNORMAL HIGH (ref 39–117)
Bilirubin, Direct: 4.8 mg/dL — ABNORMAL HIGH (ref 0.0–0.3)
Total Bilirubin: 7.2 mg/dL — ABNORMAL HIGH (ref 0.3–1.2)
Total Protein: 7.2 g/dL (ref 6.0–8.3)

## 2012-03-18 LAB — LIPASE: Lipase: 12 U/L (ref 11.0–59.0)

## 2012-03-18 LAB — BASIC METABOLIC PANEL
BUN: 12 mg/dL (ref 6–23)
Creatinine, Ser: 0.5 mg/dL (ref 0.4–1.2)
GFR: 130.75 mL/min (ref >60.00)
Glucose, Bld: 102 mg/dL — ABNORMAL HIGH (ref 70–99)
Potassium: 3.5 mEq/L (ref 3.5–5.1)

## 2012-03-18 LAB — CBC WITH DIFFERENTIAL/PLATELET
Eosinophils Relative: 4 % (ref 0.0–5.0)
HCT: 33.9 % — ABNORMAL LOW (ref 36.0–46.0)
Hemoglobin: 11.1 g/dL — ABNORMAL LOW (ref 12.0–15.0)
MCV: 89.5 fl (ref 78.0–100.0)
Monocytes Relative: 5 % (ref 3.0–12.0)
RDW: 18.2 % — ABNORMAL HIGH (ref 11.5–14.6)
WBC: 4.5 10*3/uL (ref 4.5–10.5)

## 2012-03-18 LAB — AMYLASE: Amylase: 26 U/L — ABNORMAL LOW (ref 27–131)

## 2012-03-18 NOTE — Progress Notes (Signed)
Subjective:    Patient ID: Bridget Walker, female    DOB: 05/16/1925, 78 y.o.   MRN: [id number]  HPI  Here for wt loss  In past she blamed it on not eating as much  Now is getting concerned   Is never never hungry - does not know why  Is eating less and less  Does not care whether she eats or not  When she does eat -makes healthy choices  Is at [name] now - was very stressful to move and give up her home   She thinks the main thing reducing her appetite - is stress/ preoccupation / and lack of prepping food for someone else    Is not unhappy- rather neutral for the most part   Did not like the people in the beginning - but now making some really good friends  Was doing water exercise until shingles - now is finally getting her strength back    Had shingles and then a bad cold That really drained her  Now is getting outdoors - sits in sun and also goes for a walk  Tries to get a bit of vitamin D  Takes her supplements Overall feels ok - but had a very stressful winter     Pt has slowly lost 38 lb since 209 wsa 142 in 12/11 , now 117  ( more of the loss has been since the move)  colonosc was 09 - normal   Has mld anemia- she did not want to work that up in the past due to age Lab Results  Component Value Date   WBC 4.5 03/18/2012   HGB 11.1* 03/18/2012   HCT 33.9* 03/18/2012   MCV 89.5 03/18/2012   PLT 298.0 03/18/2012     Physical symptoms -- tired/ weak (getting better), brief diarrhea - better now  No abdominal pain Shingles rash and pain are better Cold symptoms are better No cough/ fever or night sweats No n/v - just lack of appetite  Patient Active Problem List  Diagnoses  . UNSPECIFIED VITAMIN D DEFICIENCY  . HYPERCHOLESTEROLEMIA  . RESTLESS LEG SYNDROME  . TINNITUS  . GERD  . DIVERTICULOSIS OF COLON  . OVERACTIVE BLADDER  . OSTEOARTHRITIS  . OSTEOPOROSIS  . COLONIC POLYPS, HX OF  . Anemia  . Weight loss  . Fatigue  . Jaundice  . Loss of  appetite   Past Medical History  Diagnosis Date  . Arthritis     OA  . Osteoporosis   . Hyperlipidemia   . RLS (restless legs syndrome)   . Vitamin d deficiency   . GERD (gastroesophageal reflux disease)   . Overactive bladder   . Cerumen impaction     recurrent   Past Surgical History  Procedure Date  . Cataract extraction   . Cataract extraction 11/08    2nd cataract   History  Substance Use Topics  . Smoking status: Former Smoker  . Smokeless tobacco: Not on file  . Alcohol Use: No   Family History  Problem Relation Age of Onset  . Diabetes Sister   . Hypertension Sister   . Heart disease Brother   . Fibromyalgia Daughter   . Alcohol abuse Son   . Heart disease Brother   . Diabetes Brother    Allergies  Allergen Reactions  . Hydrocodone     REACTION: diarrhea  . Nsaids     REACTION: GI upset  . Penicillins     REACTION: rash     Current Outpatient Prescriptions on File Prior to Visit  Medication Sig Dispense Refill  . Calcium Carbonate-Vit D-Min 600-400 MG-UNIT TABS Take 1 tablet by mouth daily.        . Cholecalciferol 4000 UNITS CAPS Take 1 capsule by mouth daily.        . esomeprazole (NEXIUM) 40 MG capsule Take 1 capsule (40 mg total) by mouth daily before breakfast.  90 capsule  3  . meloxicam (MOBIC) 7.5 MG tablet Take one by mouth once daily with food if needed.  90 tablet  3  . Multiple Vitamin (MULTIVITAMIN) tablet Take 1 tablet by mouth daily.           Review of Systems Review of Systems  Constitutional: Negative for fever and malaise   Eyes: Negative for pain and visual disturbance.  ENT neg for st or congestion  Respiratory: Negative for cough and shortness of breath.   Cardiovascular: Negative for cp or palpitations    Gastrointestinal: Negative for nausea, diarrhea and constipation.neg for blood in stool , abd pain   Genitourinary: Negative for urgency and frequency. pos for very yellow urine (no odor)  Skin: Negative for pallor or rash   skin does appear yellow to her today Neurological: Negative for weakness, light-headedness, numbness and headaches.  Hematological: Negative for adenopathy. Does not bruise/bleed easily.  Psychiatric/Behavioral: Negative for dysphoric mood. The patient is not nervous/anxious.          Objective:   Physical Exam  Constitutional: She appears well-developed and well-nourished. No distress.  HENT:  Head: Normocephalic and atraumatic.  Mouth/Throat: Oropharynx is clear and moist.  Eyes: Conjunctivae and EOM are normal. Pupils are equal, round, and reactive to light. Right eye exhibits no discharge. Left eye exhibits no discharge. Scleral icterus is present.       Very mild icterus  Neck: Normal range of motion. Neck supple. No JVD present. Carotid bruit is not present. No thyromegaly present.  Cardiovascular: Normal rate, regular rhythm and normal heart sounds.   Pulmonary/Chest: Effort normal and breath sounds normal. No respiratory distress. She has no wheezes. She exhibits no tenderness.       No crackles or rales  Abdominal: Soft. Normal appearance, normal aorta and bowel sounds are normal. She exhibits mass. She exhibits no shifting dullness, no distension, no pulsatile liver, no fluid wave, no abdominal bruit and no ascites. There is no splenomegaly. There is tenderness in the right upper quadrant. There is no rigidity, no rebound, no guarding, no CVA tenderness, no tenderness at McBurney's point and negative Murphy's sign.       Tender in RUQ very mild  Mass vs enl liver felt in R abdomen   Musculoskeletal: Normal range of motion. She exhibits no edema.  Lymphadenopathy:    She has no cervical adenopathy.  Neurological: She is alert. She has normal reflexes. No cranial nerve deficit. She exhibits normal muscle tone. Coordination normal.  Skin: Skin is warm and dry. No rash noted.       Mild jaundice noted   Psychiatric: She has a normal mood and affect.       Pleasant and talkative             Assessment & Plan:   

## 2012-03-18 NOTE — Patient Instructions (Addendum)
We will schedule an abdominal ultrasound at check out  Also labs  Your skin looks a little yellow today - and I want to do some liver tests  Try to eat regular meals- even when you are not hungry  Follow up with me about 1 week after your ultrasound  Let me know if you get any new symptoms

## 2012-03-19 LAB — HEPATITIS PANEL, ACUTE
HCV Ab: NEGATIVE
Hepatitis B Surface Ag: NEGATIVE

## 2012-03-19 NOTE — Assessment & Plan Note (Signed)
With much decreased po intake due to dec appetite / recent illness and now abd mass/ jaundice - susp for malignancy Labs and abd [place] planned

## 2012-03-19 NOTE — Assessment & Plan Note (Signed)
New today- also pt had not noticed until today  Also abd mass and wt loss worriesome for malignancy abd [place] ordered - also labs and update

## 2012-03-19 NOTE — Assessment & Plan Note (Signed)
With poor appetite and recent stressors, having lost wt and new finding of jaundice and abd mass today Working this up - labs and abd [place] Worrisome for possible malignancy

## 2012-03-19 NOTE — Assessment & Plan Note (Signed)
Suspect multifactorial with recent illness and change in living situation/ now jaundice and abd mass and wt loss Work up - lab and [place] to begin

## 2012-03-20 ENCOUNTER — Ambulatory Visit
Admission: RE | Admit: 2012-03-20 | Discharge: 2012-03-20 | Disposition: A | Discharge status: Home / Self Care | Payer: Medicare Other | Source: Ambulatory Visit | Attending: Family Medicine | Admitting: Family Medicine

## 2012-03-20 ENCOUNTER — Other Ambulatory Visit: Payer: Self-pay | Admitting: Family Medicine

## 2012-03-20 ENCOUNTER — Telehealth: Payer: Self-pay | Admitting: Family Medicine

## 2012-03-20 DIAGNOSIS — R1900 Intra-abdominal and pelvic swelling, mass and lump, unspecified site: Secondary | ICD-10-CM

## 2012-03-20 DIAGNOSIS — R630 Anorexia: Secondary | ICD-10-CM

## 2012-03-20 DIAGNOSIS — R109 Unspecified abdominal pain: Secondary | ICD-10-CM | POA: Diagnosis not present

## 2012-03-20 DIAGNOSIS — R634 Abnormal weight loss: Secondary | ICD-10-CM | POA: Diagnosis not present

## 2012-03-20 DIAGNOSIS — R17 Unspecified jaundice: Secondary | ICD-10-CM

## 2012-03-20 DIAGNOSIS — C259 Malignant neoplasm of pancreas, unspecified: Secondary | ICD-10-CM

## 2012-03-20 DIAGNOSIS — K831 Obstruction of bile duct: Secondary | ICD-10-CM | POA: Diagnosis not present

## 2012-03-20 DIAGNOSIS — K828 Other specified diseases of gallbladder: Secondary | ICD-10-CM | POA: Diagnosis not present

## 2012-03-20 MED ORDER — IOHEXOL 300 MG/ML  SOLN
100.0000 mL | Freq: Once | INTRAMUSCULAR | Status: AC | PRN
  Administered 2012-03-20: 100 mL via INTRAVENOUS

## 2012-03-20 NOTE — Telephone Encounter (Signed)
Ref to GI for eval/ ERCP

## 2012-03-20 NOTE — Telephone Encounter (Signed)
Here for new dx  Will ref to onc and surg for panc cancer with jaundice

## 2012-03-23 ENCOUNTER — Other Ambulatory Visit: Payer: Medicare Other

## 2012-03-23 ENCOUNTER — Ambulatory Visit (INDEPENDENT_AMBULATORY_CARE_PROVIDER_SITE_OTHER): Payer: Medicare Other | Admitting: General Surgery

## 2012-03-23 ENCOUNTER — Other Ambulatory Visit: Payer: Self-pay

## 2012-03-23 ENCOUNTER — Telehealth: Payer: Self-pay | Admitting: Gastroenterology

## 2012-03-23 ENCOUNTER — Encounter (INDEPENDENT_AMBULATORY_CARE_PROVIDER_SITE_OTHER): Payer: Self-pay | Admitting: General Surgery

## 2012-03-23 VITALS — BP 122/70 | HR 78 | Temp 97.1°F | Resp 18 | Ht 61.0 in | Wt 118.0 lb

## 2012-03-23 DIAGNOSIS — C259 Malignant neoplasm of pancreas, unspecified: Secondary | ICD-10-CM | POA: Diagnosis not present

## 2012-03-23 DIAGNOSIS — R634 Abnormal weight loss: Secondary | ICD-10-CM

## 2012-03-23 DIAGNOSIS — K8689 Other specified diseases of pancreas: Secondary | ICD-10-CM

## 2012-03-23 DIAGNOSIS — R17 Unspecified jaundice: Secondary | ICD-10-CM

## 2012-03-23 NOTE — Telephone Encounter (Signed)
Pt daughter was given the instructions for the pt's procedure.  She verbalized understanding and will call with any further problems

## 2012-03-23 NOTE — Telephone Encounter (Signed)
Pt was instructed again and all questions answered

## 2012-03-23 NOTE — Telephone Encounter (Addendum)
Ercp ONLY WITHOUT MAC scheduled for 03/24/12 3 pm arrive at 2 pm [facility] outpatient registration.  Nothing to eat or drink after 11 am clear liquids after midnight until 11 am

## 2012-03-23 NOTE — Telephone Encounter (Signed)
Left message on machine to call back  

## 2012-03-23 NOTE — Telephone Encounter (Signed)
Records on Dr Christella Hartigan desk for review

## 2012-03-23 NOTE — Telephone Encounter (Signed)
Patty, Can we schedule her for EUS+ and ERCP tomorrow with propofol at [facility].  Noon a bit later, can move my 2pm endo/colon at [facility] ahead or back if needed.  ddx jaundice, panc mass.

## 2012-03-23 NOTE — Patient Instructions (Signed)
Get CA 19-9 and EUS  Think about surgery and risks.    Will see you back to discuss if you want to undergo surgery.

## 2012-03-24 ENCOUNTER — Telehealth: Payer: Self-pay | Admitting: Oncology

## 2012-03-24 ENCOUNTER — Encounter (HOSPITAL_COMMUNITY): Payer: Self-pay | Admitting: Gastroenterology

## 2012-03-24 ENCOUNTER — Encounter (HOSPITAL_COMMUNITY)
Admission: RE | Disposition: A | Discharge status: Home / Self Care | Payer: Self-pay | Source: Ambulatory Visit | Attending: Gastroenterology

## 2012-03-24 ENCOUNTER — Ambulatory Visit (HOSPITAL_COMMUNITY): Payer: Medicare Other

## 2012-03-24 ENCOUNTER — Observation Stay (HOSPITAL_COMMUNITY)
Admission: RE | Admit: 2012-03-24 | Discharge: 2012-03-25 | Disposition: A | Discharge status: Home / Self Care | Payer: Medicare Other | Source: Ambulatory Visit | Attending: Gastroenterology | Admitting: Gastroenterology

## 2012-03-24 DIAGNOSIS — K869 Disease of pancreas, unspecified: Secondary | ICD-10-CM | POA: Diagnosis not present

## 2012-03-24 DIAGNOSIS — K831 Obstruction of bile duct: Principal | ICD-10-CM | POA: Insufficient documentation

## 2012-03-24 DIAGNOSIS — R933 Abnormal findings on diagnostic imaging of other parts of digestive tract: Secondary | ICD-10-CM

## 2012-03-24 DIAGNOSIS — R17 Unspecified jaundice: Secondary | ICD-10-CM

## 2012-03-24 DIAGNOSIS — K838 Other specified diseases of biliary tract: Secondary | ICD-10-CM | POA: Diagnosis not present

## 2012-03-24 DIAGNOSIS — E785 Hyperlipidemia, unspecified: Secondary | ICD-10-CM | POA: Diagnosis not present

## 2012-03-24 DIAGNOSIS — R634 Abnormal weight loss: Secondary | ICD-10-CM | POA: Diagnosis not present

## 2012-03-24 DIAGNOSIS — Z79899 Other long term (current) drug therapy: Secondary | ICD-10-CM | POA: Insufficient documentation

## 2012-03-24 DIAGNOSIS — M810 Age-related osteoporosis without current pathological fracture: Secondary | ICD-10-CM | POA: Diagnosis not present

## 2012-03-24 DIAGNOSIS — R932 Abnormal findings on diagnostic imaging of liver and biliary tract: Secondary | ICD-10-CM | POA: Diagnosis not present

## 2012-03-24 DIAGNOSIS — K219 Gastro-esophageal reflux disease without esophagitis: Secondary | ICD-10-CM | POA: Insufficient documentation

## 2012-03-24 HISTORY — PX: ERCP: SHX5425

## 2012-03-24 LAB — COMPREHENSIVE METABOLIC PANEL
ALT: 194 U/L — ABNORMAL HIGH (ref 0–35)
AST: 254 U/L — ABNORMAL HIGH (ref 0–37)
Albumin: 3.2 g/dL — ABNORMAL LOW (ref 3.5–5.2)
Calcium: 9.2 mg/dL (ref 8.4–10.5)
Creatinine, Ser: 0.47 mg/dL — ABNORMAL LOW (ref 0.50–1.10)
Sodium: 139 mEq/L (ref 135–145)

## 2012-03-24 SURGERY — ERCP, WITH INTERVENTION IF INDICATED
Anesthesia: Moderate Sedation

## 2012-03-24 MED ORDER — FENTANYL CITRATE 0.05 MG/ML IJ SOLN
INTRAMUSCULAR | Status: DC | PRN
  Administered 2012-03-24 (×4): 25 ug via INTRAVENOUS

## 2012-03-24 MED ORDER — KCL IN DEXTROSE-NACL 20-5-0.45 MEQ/L-%-% IV SOLN
INTRAVENOUS | Status: DC
  Administered 2012-03-24: 19:00:00 via INTRAVENOUS
  Filled 2012-03-24 (×3): qty 1000

## 2012-03-24 MED ORDER — CIPROFLOXACIN IN D5W 400 MG/200ML IV SOLN
400.0000 mg | Freq: Once | INTRAVENOUS | Status: AC
  Administered 2012-03-24: 400 mg via INTRAVENOUS

## 2012-03-24 MED ORDER — FENTANYL CITRATE 0.05 MG/ML IJ SOLN
INTRAMUSCULAR | Status: AC
  Filled 2012-03-24: qty 4

## 2012-03-24 MED ORDER — MIDAZOLAM HCL 10 MG/2ML IJ SOLN
INTRAMUSCULAR | Status: AC
  Filled 2012-03-24: qty 4

## 2012-03-24 MED ORDER — ACETAMINOPHEN 650 MG RE SUPP: 650.0000 mg | Freq: Four times a day (QID) | RECTAL | Status: DC | PRN

## 2012-03-24 MED ORDER — CIPROFLOXACIN IN D5W 400 MG/200ML IV SOLN
INTRAVENOUS | Status: AC
  Filled 2012-03-24: qty 200

## 2012-03-24 MED ORDER — ONDANSETRON HCL 4 MG/2ML IJ SOLN: 4.0000 mg | Freq: Four times a day (QID) | INTRAMUSCULAR | Status: DC | PRN

## 2012-03-24 MED ORDER — ACETAMINOPHEN 325 MG PO TABS
650.0000 mg | ORAL_TABLET | Freq: Four times a day (QID) | ORAL | Status: DC | PRN
  Administered 2012-03-24 – 2012-03-25 (×2): 650 mg via ORAL
  Filled 2012-03-24 (×2): qty 2

## 2012-03-24 MED ORDER — ONDANSETRON HCL 4 MG PO TABS: 4.0000 mg | ORAL_TABLET | Freq: Four times a day (QID) | ORAL | Status: DC | PRN

## 2012-03-24 MED ORDER — SODIUM CHLORIDE 0.9 % IV SOLN
INTRAVENOUS | Status: DC
  Administered 2012-03-24: 15:00:00 via INTRAVENOUS

## 2012-03-24 MED ORDER — MIDAZOLAM HCL 10 MG/2ML IJ SOLN
INTRAMUSCULAR | Status: DC | PRN
  Administered 2012-03-24 (×4): 2.5 mg via INTRAVENOUS

## 2012-03-24 MED ORDER — DIPHENHYDRAMINE HCL 50 MG/ML IJ SOLN
INTRAMUSCULAR | Status: AC
  Filled 2012-03-24: qty 1

## 2012-03-24 MED ORDER — GLUCAGON HCL (RDNA) 1 MG IJ SOLR
INTRAMUSCULAR | Status: DC | PRN
  Administered 2012-03-24: .5 mg via INTRAVENOUS

## 2012-03-24 MED ORDER — BUTAMBEN-TETRACAINE-BENZOCAINE 2-2-14 % EX AERO
INHALATION_SPRAY | CUTANEOUS | Status: DC | PRN
  Administered 2012-03-24: 2 via TOPICAL

## 2012-03-24 MED ORDER — GLUCAGON HCL (RDNA) 1 MG IJ SOLR
INTRAMUSCULAR | Status: AC
  Filled 2012-03-24: qty 2

## 2012-03-24 NOTE — Telephone Encounter (Signed)
called pts home and daughter joyce lmovm for appt on 04/25 and asked that she rtn call to confirm

## 2012-03-24 NOTE — H&P (View-Only) (Signed)
Subjective:    Patient ID: Bridget Walker, female    DOB: [birth year], 78 y.o.   MRN: [id number]  HPI  Here for wt loss  In past she blamed it on not eating as much  Now is getting concerned   Is never never hungry - does not know why  Is eating less and less  Does not care whether she eats or not  When she does eat -makes healthy choices  Is at [facility] now - was very stressful to move and give up her home   She thinks the main thing reducing her appetite - is stress/ preoccupation / and lack of prepping food for someone else    Is not unhappy- rather neutral for the most part   Did not like the people in the beginning - but now making some really good friends  Was doing water exercise until shingles - now is finally getting her strength back    Had shingles and then a bad cold That really drained her  Now is getting outdoors - sits in sun and also goes for a walk  Tries to get a bit of vitamin D  Takes her supplements Overall feels ok - but had a very stressful winter     Pt has slowly lost 38 lb since 209 wsa 142 in 12/11 , now 117  ( more of the loss has been since the move)  colonosc was 09 - normal   Has mld anemia- she did not want to work that up in the past due to age Lab Results  Component Value Date   WBC 4.5 03/18/2012   HGB 11.1* 03/18/2012   HCT 33.9* 03/18/2012   MCV 89.5 03/18/2012   PLT 298.0 03/18/2012     Physical symptoms -- tired/ weak (getting better), brief diarrhea - better now  No abdominal pain Shingles rash and pain are better Cold symptoms are better No cough/ fever or night sweats No n/v - just lack of appetite  Patient Active Problem List  Diagnoses  . UNSPECIFIED VITAMIN D DEFICIENCY  . HYPERCHOLESTEROLEMIA  . RESTLESS LEG SYNDROME  . TINNITUS  . GERD  . DIVERTICULOSIS OF COLON  . OVERACTIVE BLADDER  . OSTEOARTHRITIS  . OSTEOPOROSIS  . COLONIC POLYPS, HX OF  . Anemia  . Weight loss  . Fatigue  . Jaundice  . Loss of  appetite   Past Medical History  Diagnosis Date  . Arthritis     OA  . Osteoporosis   . Hyperlipidemia   . RLS (restless legs syndrome)   . Vitamin d deficiency   . GERD (gastroesophageal reflux disease)   . Overactive bladder   . Cerumen impaction     recurrent   Past Surgical History  Procedure Date  . Cataract extraction   . Cataract extraction 11/08    2nd cataract   History  Substance Use Topics  . Smoking status: Former Games developer  . Smokeless tobacco: Not on file  . Alcohol Use: No   Family History  Problem Relation Age of Onset  . Diabetes Sister   . Hypertension Sister   . Heart disease Brother   . Fibromyalgia Daughter   . Alcohol abuse Son   . Heart disease Brother   . Diabetes Brother    Allergies  Allergen Reactions  . Hydrocodone     REACTION: diarrhea  . Nsaids     REACTION: GI upset  . Penicillins     REACTION: rash  Current Outpatient Prescriptions on File Prior to Visit  Medication Sig Dispense Refill  . Calcium Carbonate-Vit D-Min 600-400 MG-UNIT TABS Take 1 tablet by mouth daily.        . Cholecalciferol 4000 UNITS CAPS Take 1 capsule by mouth daily.        Marland Kitchen esomeprazole (NEXIUM) 40 MG capsule Take 1 capsule (40 mg total) by mouth daily before breakfast.  90 capsule  3  . meloxicam (MOBIC) 7.5 MG tablet Take one by mouth once daily with food if needed.  90 tablet  3  . Multiple Vitamin (MULTIVITAMIN) tablet Take 1 tablet by mouth daily.           Review of Systems Review of Systems  Constitutional: Negative for fever and malaise   Eyes: Negative for pain and visual disturbance.  ENT neg for st or congestion  Respiratory: Negative for cough and shortness of breath.   Cardiovascular: Negative for cp or palpitations    Gastrointestinal: Negative for nausea, diarrhea and constipation.neg for blood in stool , abd pain   Genitourinary: Negative for urgency and frequency. pos for very yellow urine (no odor)  Skin: Negative for pallor or rash   skin does appear yellow to her today Neurological: Negative for weakness, light-headedness, numbness and headaches.  Hematological: Negative for adenopathy. Does not bruise/bleed easily.  Psychiatric/Behavioral: Negative for dysphoric mood. The patient is not nervous/anxious.          Objective:   Physical Exam  Constitutional: She appears well-developed and well-nourished. No distress.  HENT:  Head: Normocephalic and atraumatic.  Mouth/Throat: Oropharynx is clear and moist.  Eyes: Conjunctivae and EOM are normal. Pupils are equal, round, and reactive to light. Right eye exhibits no discharge. Left eye exhibits no discharge. Scleral icterus is present.       Very mild icterus  Neck: Normal range of motion. Neck supple. No JVD present. Carotid bruit is not present. No thyromegaly present.  Cardiovascular: Normal rate, regular rhythm and normal heart sounds.   Pulmonary/Chest: Effort normal and breath sounds normal. No respiratory distress. She has no wheezes. She exhibits no tenderness.       No crackles or rales  Abdominal: Soft. Normal appearance, normal aorta and bowel sounds are normal. She exhibits mass. She exhibits no shifting dullness, no distension, no pulsatile liver, no fluid wave, no abdominal bruit and no ascites. There is no splenomegaly. There is tenderness in the right upper quadrant. There is no rigidity, no rebound, no guarding, no CVA tenderness, no tenderness at McBurney's point and negative Murphy's sign.       Tender in RUQ very mild  Mass vs enl liver felt in R abdomen   Musculoskeletal: Normal range of motion. She exhibits no edema.  Lymphadenopathy:    She has no cervical adenopathy.  Neurological: She is alert. She has normal reflexes. No cranial nerve deficit. She exhibits normal muscle tone. Coordination normal.  Skin: Skin is warm and dry. No rash noted.       Mild jaundice noted   Psychiatric: She has a normal mood and affect.       Pleasant and talkative             Assessment & Plan:

## 2012-03-24 NOTE — Progress Notes (Signed)
Chief Complaint  Patient presents with  . Pancreatic Cancer    HISTORY: Pt is 78 year old female who presents with jaundice, weight loss, and abdominal pain.  She had shingles and a cold in dec/january, and she never quite felt like she recovered.  She used to be able to walk a mile or two without stopping, but lately, has been weak and out of breath. She developed jaundice a few weeks ago, and underwent ultrasound followed by CT.  She was found to have an obstructing mass in the pancreas.  She has decreased appetite.  She has had a few episodes of nausea, but feels that most of her weight loss is just because of the appetite.  Her shingles has resolved.  She just sold her house last fall and moved into a retirement community.  She is living independently there.  She has some lower abdominal pain and a "knot" in her groin.  This gets smaller when she lies down.  She denies significant back pain.    Past Medical History  Diagnosis Date  . Osteoporosis   . Hyperlipidemia   . RLS (restless legs syndrome)   . Vitamin d deficiency   . GERD (gastroesophageal reflux disease)   . Overactive bladder   . Cerumen impaction     recurrent  . Cancer     pancreatic  . Unexplained weight loss   . Cough   . Constipation   . Diarrhea   . Nausea   . Weakness   . Shingles   . Arthritis     OA, in hands    Past Surgical History  Procedure Date  . Cataract extraction     patient unsure of date procedure was done.  . Cataract extraction 11/08    2nd cataract    No current facility-administered medications for this visit.   No current outpatient prescriptions on file.   Facility-Administered Medications Ordered in Other Visits  Medication Dose Route Frequency Provider Last Rate Last Dose  . acetaminophen (TYLENOL) tablet 650 mg  650 mg Oral Q6H PRN Daniel P Jacobs, MD   650 mg at 03/24/12 2051   Or  . acetaminophen (TYLENOL) suppository 650 mg  650 mg Rectal Q6H PRN Daniel P Jacobs, MD        . ciprofloxacin (CIPRO) IVPB 400 mg  400 mg Intravenous Once Daniel P Jacobs, MD   400 mg at 03/24/12 1508  . dextrose 5 % and 0.45 % NaCl with KCl 20 mEq/L infusion   Intravenous Continuous Daniel P Jacobs, MD 50 mL/hr at 03/24/12 1902    . ondansetron (ZOFRAN) tablet 4 mg  4 mg Oral Q6H PRN Daniel P Jacobs, MD       Or  . ondansetron (ZOFRAN) injection 4 mg  4 mg Intravenous Q6H PRN Daniel P Jacobs, MD      . DISCONTD: 0.9 %  sodium chloride infusion   Intravenous Continuous Daniel P Jacobs, MD 20 mL/hr at 03/24/12 1508    . DISCONTD: butamben-tetracaine-benzocaine (CETACAINE) spray    PRN Daniel P Jacobs, MD   2 spray at 03/24/12 1546  . DISCONTD: fentaNYL (SUBLIMAZE) injection    PRN Daniel P Jacobs, MD   25 mcg at 03/24/12 1618  . DISCONTD: glucagon (GLUCAGEN) injection    PRN Daniel P Jacobs, MD   0.5 mg at 03/24/12 1618  . DISCONTD: midazolam (VERSED) injection    PRN Daniel P Jacobs, MD   2.5 mg at 03/24/12 1618       Allergies  Allergen Reactions  . Hydrocodone Other (See Comments)    Patient unsure of reaction, but it may have been diarrhea.  . Nsaids Other (See Comments)    GI upset  . Penicillins Rash    On scalp     Family History  Problem Relation Age of Onset  . Diabetes Sister   . Hypertension Sister   . Heart disease Brother   . Fibromyalgia Daughter   . Alcohol abuse Son   . Heart disease Brother   . Diabetes Brother   . Heart disease Mother   . Pneumonia Father   . Cancer Other     niece had breast and bone cancer     History   Social History  . Marital Status: Widowed    Spouse Name: N/A    Number of Children: N/A  . Years of Education: N/A   Social History Main Topics  . Smoking status: Former Smoker    Quit date: 03/23/1992  . Smokeless tobacco: Never Used  . Alcohol Use: No  . Drug Use: No  . Sexually Active: No     not applicable   Other Topics Concern  . None   Social History Narrative  . None     REVIEW OF SYSTEMS - PERTINENT  POSITIVES ONLY: 12 point review of systems negative other than HPI and PMH except for cough, constipation, joint pain, weakness, shingles.    EXAM: Filed Vitals:   03/23/12 1336  BP: 122/70  Pulse: 78  Temp: 97.1 F (36.2 C)  Resp: 18    Gen:  No acute distress.   Well groomed. Cachectic Neurological: Alert and oriented to person, place, and time. Coordination normal.  Head: Normocephalic and atraumatic.  Eyes: Conjunctivae are normal. Pupils are equal, round, and reactive to light. No scleral icterus.  Neck: Normal range of motion. Neck supple. No tracheal deviation or thyromegaly present.  Cardiovascular: Normal rate, regular rhythm, normal heart sounds and intact distal pulses.  Exam reveals no gallop and no friction rub.  No murmur heard. Respiratory: Effort normal.  No respiratory distress. No chest wall tenderness. Breath sounds normal.  No wheezes, rales or rhonchi.  GI: Soft. Bowel sounds are normal. The abdomen is soft and nontender.  There is no rebound and no guarding. Right inguinal hernia, reducible.   Musculoskeletal: Normal range of motion. Extremities are nontender.  Lymphadenopathy: No cervical, preauricular, postauricular or axillary adenopathy is present Skin: Skin is jaundiced.  Skin is warm and dry. No rash noted. No diaphoresis. No erythema. No pallor. No clubbing, cyanosis, or edema.   Psychiatric: Normal mood and affect. Behavior is normal. Judgment and thought content normal.    LABORATORY RESULTS: Available labs are reviewed  Tbili 6.1.    RADIOLOGY RESULTS: CT abd/pelvis IMPRESSION:  1. Indistinct somewhat isodense prominence of the uncinate process  of the pancreas and to a lesser degree the head of the pancreas  worrisome for pancreatic carcinoma.  2. Significant dilatation of the common bile duct and intrahepatic  ducts indicating obstruction distally.  3. Recommend ERCP and/or endoscopic ultrasound to assess further  and to exclude a possible  ampullary lesion.  4. Moderately large hiatal hernia.  5. Slight soft tissue prominence within the peritoneal cavity.  Cannot exclude early peritoneal spread of carcinoma.    ASSESSMENT AND PLAN: Weight loss Will need to increase protein intake and will likely need nutrition consult.  Pancreatic cancer Pt with presumed pancreatic cancer based on presentation and age.    Getting ERCP for relief of jaundice and brushings.  If negative, will get EUS for biopsy.  She also will need a CA 19-9  I had a long discussion (30min) with her and her children regarding surgery.  I think she is overall too frail to undergo a whipple at this time.  She became seriously debilitated from getting Shingles and a upper respiratory infection to the point that she was unable to resume her prior activities.  She is currently living independently in a retirement community, and would likely need rehab or skilled nursing facility post op.  She and her family are going to think on it.    She will need referral to oncology and radiation.  If she would like to consider whipple in the future, she could undergo a "neoadjuvant" approach with the understanding that if her health does not improve with stenting and relief of jaundice, then she would transition to palliative chemoradiation.   We discussed the magnitude of the operation and the recovery.  We also discussed possible complications.  She does need a tissue diagnosis in order to proceed with therapy.      She is also at high risk to have carcinomatosis based on CT scan.    Jamyrah Saur L Breya Cass MD Surgical Oncology, General and Endocrine Surgery [facility]      Visit Diagnoses: 1. Weight loss   2. Pancreatic cancer     Primary Care Physician: Marne Tower, MD, MD  Referring: Daniel Jacobs, MD  

## 2012-03-24 NOTE — Assessment & Plan Note (Signed)
Will need to increase protein intake and will likely need nutrition consult.

## 2012-03-24 NOTE — H&P (Signed)
Gastroenterology Primary Care Physician:  [name], MD, MD Primary Gastroenterologist:  Dr. Christella Hartigan  CC: Jaundice   HPI:  Bridget Walker is a 77 y.o. female s/p ERCP for painless jaundice, weight loss, mass in pancreas. The procedure went smoothly (strictrue brushed and stented).  She is being admitted for overnight observation   Past Medical History  Diagnosis Date  . Osteoporosis   . Hyperlipidemia   . RLS (restless legs syndrome)   . Vitamin d deficiency   . GERD (gastroesophageal reflux disease)   . Overactive bladder   . Cerumen impaction     recurrent  . Cancer     pancreatic  . Unexplained weight loss   . Cough   . Constipation   . Diarrhea   . Nausea   . Weakness   . Shingles   . Arthritis     OA, in hands    Past Surgical History  Procedure Date  . Cataract extraction     patient unsure of date procedure was done.  . Cataract extraction 11/08    2nd cataract    Prior to Admission medications   Medication Sig Start Date End Date Taking? Authorizing Provider  Calcium Carbonate-Vit D-Min 600-400 MG-UNIT TABS Take 1 tablet by mouth daily.     Yes Historical Provider, MD  Cholecalciferol 4000 UNITS CAPS Take 1 capsule by mouth daily.     Yes Historical Provider, MD  esomeprazole (NEXIUM) 40 MG capsule Take 1 capsule (40 mg total) by mouth daily before breakfast. 12/18/11  Yes Judy Pimple, MD  meloxicam (MOBIC) 7.5 MG tablet Take one by mouth once daily with food if needed. 12/18/11  Yes Judy Pimple, MD  Multiple Vitamin (MULTIVITAMIN) tablet Take 1 tablet by mouth daily.     Yes Historical Provider, MD    Current Facility-Administered Medications  Medication Dose Route Frequency Provider Last Rate Last Dose  . 0.9 %  sodium chloride infusion   Intravenous Continuous Rachael Fee, MD 20 mL/hr at 03/24/12 1508    . ciprofloxacin (CIPRO) IVPB 400 mg  400 mg Intravenous Once Rachael Fee, MD   400 mg at 03/24/12 1508  . DISCONTD:  butamben-tetracaine-benzocaine (CETACAINE) spray    PRN Rachael Fee, MD   2 spray at 03/24/12 1546  . DISCONTD: fentaNYL (SUBLIMAZE) injection    PRN Rachael Fee, MD   25 mcg at 03/24/12 1618  . DISCONTD: glucagon (GLUCAGEN) injection    PRN Rachael Fee, MD   0.5 mg at 03/24/12 1618  . DISCONTD: midazolam (VERSED) injection    PRN Rachael Fee, MD   2.5 mg at 03/24/12 1618    Allergies as of 03/23/2012 - Review Complete 03/23/2012  Allergen Reaction Noted  . Hydrocodone Other (See Comments) 09/23/2007  . Nsaids Other (See Comments) 09/23/2007  . Penicillins Rash 09/23/2007    Family History  Problem Relation Age of Onset  . Diabetes Sister   . Hypertension Sister   . Heart disease Brother   . Fibromyalgia Daughter   . Alcohol abuse Son   . Heart disease Brother   . Diabetes Brother   . Heart disease Mother   . Pneumonia Father   . Cancer Other     niece had breast and bone cancer    History   Social History  . Marital Status: Widowed    Spouse Name: N/A    Number of Children: N/A  . Years of Education: N/A  Occupational History  . Not on file.   Social History Main Topics  . Smoking status: Former Smoker    Quit date: 03/23/1992  . Smokeless tobacco: Never Used  . Alcohol Use: No  . Drug Use: No  . Sexually Active: Not on file   Other Topics Concern  . Not on file   Social History Narrative  . No narrative on file     Review of Systems: Pertinent positive and negative review of systems were noted in the above HPI section. Complete review of systems was performed and was otherwise normal.   Physical Exam: Vital signs in last 24 hours: Temp:  [98.1 F (36.7 C)] 98.1 F (36.7 C) (04/16 1451) Pulse Rate:  [67] 67  (04/16 1451) Resp:  [19-27] 20  (04/16 1605) BP: (127-151)/(68-89) 148/78 mmHg (04/16 1605) SpO2:  [96 %-99 %] 98 % (04/16 1605)   Constitutional: generally well-appearing except she is pretty frail, elderly Psychiatric:  alert and oriented x3 Eyes: extraocular movements intact, icterus+ Mouth: oral pharynx moist, no lesions Neck: supple no lymphadenopathy Cardiovascular: heart regular rate and rhythm Lungs: clear to auscultation bilaterally Abdomen: soft, nontender, nondistended, no obvious ascites, no peritoneal signs, normal bowel sounds Extremities: no lower extremity edema bilaterally Skin: no lesions on visible extremities  BMET  Basename 03/24/12 1435  NA 139  K 2.9*  CL 101  CO2 25  GLUCOSE 101*  BUN 11  CREATININE 0.47*  CALCIUM 9.2   LFT  Basename 03/24/12 1435  PROT 6.6  ALBUMIN 3.2*  AST 254*  ALT 194*  ALKPHOS 542*  BILITOT 6.1*  BILIDIR --  IBILI --      Impression/Plan: 78 y.o. female  S/p ERCP for likely pancreatic cancer  Admit for overnight observation. LFTs in AM.  Hopefully home tomorrow.    Rob Bunting  03/24/2012, 4:44 PM [facility] Pager [phone]

## 2012-03-24 NOTE — Op Note (Signed)
[facility] [address] [place], [place]  [address]  ERCP PROCEDURE REPORT  PATIENT:  Bridget, Walker  MR#:  [id number] BIRTHDATE:  Aug 24, 1925  GENDER:  female ENDOSCOPIST:  Rachael Fee, MD ASSISTANT:  Beryle Beams, Technician, Dwain Sarna, RN CGRN, Debi Claudine Mouton, RN, CGRN, Angelique Blonder PROCEDURE DATE:  03/24/2012 PROCEDURE:  ERCP with stent placement ASA CLASS:  Class II INDICATIONS:  painless jaundice, dilated biliary tree on CT with suggestion of pancreatic mass, weight loss MEDICATIONS:  Fentanyl 100 mcg IV, Versed 10 mg IV, cipro 400 mg IV TOPICAL ANESTHETIC:  Cetacaine Spray  DESCRIPTION OF PROCEDURE:   After the risks benefits and alternatives of the procedure were thoroughly explained, informed consent was obtained.  The Pentax ERCP AO-[id number] endoscope was introduced through the mouth and advanced to the second portion of the duodenum without detailed examination of the UGI tract. The major papilla was normal.  A .035 hydrawire in a 44 Autotome was used to cannulate the biliary tree. An identical wire was temporarily placed in the main pancreatic duct to facilitate biliary cannulation.  Dye was injected, cholangiogram revealed a tight distal CBD stricture (2cm long), proximal to this the CBD was dilaed to 1.2cm.  Intrahepatic ducts were partially filled and were also dilated.  There were no filling defects. The distal CBD stricture was brushed for cytology and then a 10mm diameter 6cm long, uncovered SEMS was placed in good position bridging the stricture. The distal 1cm of the stent extended into the duodenum across the major papilla. There was immediate flow of old, dark bile through the stent after its placement. The main pancreatic duct was never injected with dye.  <<PROCEDUREIMAGES>>  Impression: Distal CBD stricture, 2cm long. This was brushed for cytology and then and 10mm diameter, 6cm long, uncovered metal biliary stent was placed  across the stricture.  She will be admitted for overnight observation and then hopefully home tomorrow.  If brushings are NON diagnostic, then will likely proceed with EUS, FNA next week.  ______________________________ Rachael Fee, MD  cc: [name], MD; [name], MD; [name], MD  n. eSIGNED:   Rachael Fee at 03/24/2012 04:37 PM  [name], [phone]

## 2012-03-24 NOTE — Assessment & Plan Note (Signed)
Pt with presumed pancreatic cancer based on presentation and age.  Getting ERCP for relief of jaundice and brushings.  If negative, will get EUS for biopsy.  She also will need a CA 19-9  I had a long discussion ( ) with her and her children regarding surgery.  I think she is overall too frail to undergo a whipple at this time.  She became seriously debilitated from getting Shingles and a upper respiratory infection to the point that she was unable to resume her prior activities.  She is currently living independently in a retirement community, and would likely need rehab or skilled nursing facility post op.  She and her family are going to think on it.    She will need referral to oncology and radiation.  If she would like to consider whipple in the future, she could undergo a "neoadjuvant" approach with the understanding that if her health does not improve with stenting and relief of jaundice, then she would transition to palliative chemoradiation.   We discussed the magnitude of the operation and the recovery.  We also discussed possible complications.  She does need a tissue diagnosis in order to proceed with therapy.

## 2012-03-24 NOTE — Interval H&P Note (Signed)
History and Physical Interval Note:  03/24/2012 2:28 PM  Bridget Walker  has presented today for surgery, with the diagnosis of Jaundice [782.4] Pancreatic mass [577.9]  The various methods of treatment have been discussed with the patient and family. After consideration of risks, benefits and other options for treatment, the patient has consented to  Procedure(s) (LRB): ENDOSCOPIC RETROGRADE CHOLANGIOPANCREATOGRAPHY (ERCP) (N/A) as a surgical intervention .  The patients' history has been reviewed, patient examined, no change in status, stable for surgery.  I have reviewed the patients' chart and labs.  Questions were answered to the patient's satisfaction.     Rob Bunting

## 2012-03-25 ENCOUNTER — Encounter (HOSPITAL_COMMUNITY): Payer: Self-pay | Admitting: Gastroenterology

## 2012-03-25 ENCOUNTER — Encounter (HOSPITAL_COMMUNITY): Payer: Self-pay

## 2012-03-25 ENCOUNTER — Ambulatory Visit: Payer: Medicare Other | Admitting: Family Medicine

## 2012-03-25 LAB — HEPATIC FUNCTION PANEL
Albumin: 2.6 g/dL — ABNORMAL LOW (ref 3.5–5.2)
Indirect Bilirubin: 1.6 mg/dL — ABNORMAL HIGH (ref 0.3–0.9)
Total Bilirubin: 4 mg/dL — ABNORMAL HIGH (ref 0.3–1.2)
Total Protein: 5.6 g/dL — ABNORMAL LOW (ref 6.0–8.3)

## 2012-03-25 NOTE — Plan of Care (Signed)
Problem: Discharge Progression Outcomes Goal: Discharge plan in place and appropriate Outcome: Completed/Met Date Met:  03/25/12 Pt dc'd to daughter.

## 2012-03-25 NOTE — Progress Notes (Signed)
   CARE MANAGEMENT NOTE 03/25/2012  Patient:  Bridget Walker, Bridget Walker   Account Number:  [id number]  Date Initiated:  03/25/2012  Documentation initiated by:  PEARSON,COOKIE  Subjective/Objective Assessment:   pt admitted s/p  ERCP for painless jaundice, weight loss, mass in pancreas     Action/Plan:   plan to dc home   Anticipated DC Date:  03/25/2012   Anticipated DC Plan:  HOME/SELF CARE         Choice offered to / List presented to:             Status of service:  In process, will continue to follow Medicare Important Message given?  NA - LOS <3 / Initial given by admissions (If response is "NO", the following Medicare IM given date fields will be blank) Date Medicare IM given:   Date Additional Medicare IM given:    Discharge Disposition:  HOME/SELF CARE  Per UR Regulation:  Reviewed for med. necessity/level of care/duration of stay  If discussed at Long Length of Stay Meetings, dates discussed:    Comments:  03/25/12 MPearson, RN, BSN Chart reviewed.

## 2012-03-25 NOTE — Discharge Summary (Signed)
[facility] Discharge Summary  Name: Bridget Walker MRN: [id number] DOB: 26-Dec-1924 78 y.o. PCP:  [name], MD, MD  Date of Admission: 03/24/2012  1:55 PM Date of Discharge: 03/25/2012 Attending Physician: Rob Bunting, MD  Discharge Diagnosis:  CBD stricture / pancreatic mass  Consultations:  Surgery- Dr. Donell Beers  GI Procedures:  Dg Ercp  03/24/2012  *RADIOLOGY REPORT*  Clinical Data: Jaundice, dilated common duct  ERCP  Comparison:  03/20/2012 CT  Technique:  Multiple spot images obtained with the fluoroscopic device and submitted for interpretation post-procedure.  ERCP was performed by Dr. Christella Hartigan.  Findings: Eight intraprocedural fluoroscopic images are provided. These demonstrate apparent cannulation of the pancreatic duct, not opacified, and dilated common duct.  Subsequent placement of a metallic wall stent was performed with narrowing at its distal/intrapancreatic portion and incomplete intrahepatic ductal decompression.  IMPRESSION: Intraprocedural imaging with placement of metallic common duct wall stent and incomplete intrahepatic ductal decompression.  These images were submitted for radiologic interpretation only. Please see the procedural report for the amount of contrast and the fluoroscopy time utilized.  Original Report Authenticated By: Harrel Lemon, M.D.   History/Physical Exam:  See Admission H&P  Admission HPI: Per Wendall Papa, MD Bridget Walker is a 78 y.o. female s/p ERCP for painless jaundice, weight loss, mass in pancreas. The procedure went smoothly (strictrue brushed and stented). She is being admitted for overnight observation   Hospital Course by problem list:  1. CBD stricture / pancreatic mass. Patient admitted for observation post-ERCP. She did fine overnight without significant pain. LFTs trending down by time of discharge. Patient seen by surgery to discuss possible Whipple though it doesn't seem she is great surgical candidate.   Arrangements  are being made for patient  to see Oncology as outpatient. In the meantime, Dr. Christella Hartigan will call patient with path results. If path negative, she may need EUS with FNA.   Discharge Vitals:  BP 121/67  Pulse 67  Temp(Src) 97.1 F (36.2 C) (Oral)  Resp 18  Ht 5\' 1"  (1.549 m)  Wt 119 lb 0.8 oz (54 kg)  BMI 22.49 kg/m2  SpO2 95%  Discharge Labs:  Results for orders placed during the hospital encounter of 03/24/12 (from the past 24 hour(s))  HEPATIC FUNCTION PANEL     Status: Abnormal   Collection Time   03/25/12  4:00 AM      Component Value Range   Total Protein 5.6 (*) 6.0 - 8.3 (g/dL)   Albumin 2.6 (*) 3.5 - 5.2 (g/dL)   AST 409 (*) 0 - 37 (U/L)   ALT 149 (*) 0 - 35 (U/L)   Alkaline Phosphatase 451 (*) 39 - 117 (U/L)   Total Bilirubin 4.0 (*) 0.3 - 1.2 (mg/dL)   Bilirubin, Direct 2.4 (*) 0.0 - 0.3 (mg/dL)   Indirect Bilirubin 1.6 (*) 0.3 - 0.9 (mg/dL)    Disposition and follow-up:   Ms.Bridget Walker was discharged from [facility] in stable condition.    Follow-up Appointments: Discharge Orders    Future Appointments: Provider: Department: Dept Phone: Center:   04/02/2012 1:30 PM [facility] [phone] None   04/02/2012 2:00 PM Thornton Papas, MD [facility] [phone] None      Discharge Medications: Medication List  As of 03/25/2012  3:50 PM   TAKE these medications         Calcium Carbonate-Vit D-Min 600-400 MG-UNIT Tabs   Take 1 tablet by mouth daily.  Cholecalciferol 4000 UNITS Caps   Take 1 capsule by mouth daily.      esomeprazole 40 MG capsule   Commonly known as: NEXIUM   Take 1 capsule (40 mg total) by mouth daily before breakfast.      meloxicam 7.5 MG tablet   Commonly known as: MOBIC   Take 7.5 mg by mouth daily as needed. For arthritic pain      multivitamin tablet   Take 1 tablet by mouth daily.      TYLENOL COLD MULTI-SYMPTOM DAY 10-5-325 MG Tabs   Generic drug: DM-Phenylephrine-Acetaminophen     Take 1 tablet by mouth daily.            Signed: Willette Cluster 03/25/2012, 3:50 PM

## 2012-03-25 NOTE — Progress Notes (Signed)
[facility] Progress Note  SUBJECTIVE: feel okay, had some abdominal discomfort last night, took Tylenol  OBJECTIVE:  Vital signs in last 24 hours: Temp:  [97.1 F (36.2 C)-98.1 F (36.7 C)] 97.1 F (36.2 C) (04/17 1011) Pulse Rate:  [57-71] 67  (04/17 1011) Resp:  [18-27] 18  (04/17 1011) BP: (99-151)/(55-89) 121/67 mmHg (04/17 1011) SpO2:  [92 %-99 %] 95 % (04/17 1011) Weight:  [119 lb 0.8 oz (54 kg)] 119 lb 0.8 oz (54 kg) (04/16 1802) Last BM Date: 03/24/12 General:    white female in NAD Heart:  Regular rate and rhythm Abdomen:  Soft, nontender and nondistended. Normal bowel sounds. Extremities:  Without edema. Neurologic:  Alert and oriented,  grossly normal neurologically. Psych:  Cooperative. Normal mood and affect.  BMET  Basename 03/24/12 1435  NA 139  K 2.9*  CL 101  CO2 25  GLUCOSE 101*  BUN 11  CREATININE 0.47*  CALCIUM 9.2   LFT  Basename 03/25/12 0400  PROT 5.6*  ALBUMIN 2.6*  AST 172*  ALT 149*  ALKPHOS 451*  BILITOT 4.0*  BILIDIR 2.4*  IBILI 1.6*   Studies/Results: Dg Ercp  03/24/2012  *RADIOLOGY REPORT*  Clinical Data: Jaundice, dilated common duct  ERCP  Comparison:  03/20/2012 CT  Technique:  Multiple spot images obtained with the fluoroscopic device and submitted for interpretation post-procedure.  ERCP was performed by Dr. Christella Hartigan.  Findings: Eight intraprocedural fluoroscopic images are provided. These demonstrate apparent cannulation of the pancreatic duct, not opacified, and dilated common duct.  Subsequent placement of a metallic wall stent was performed with narrowing at its distal/intrapancreatic portion and incomplete intrahepatic ductal decompression.  IMPRESSION: Intraprocedural imaging with placement of metallic common duct wall stent and incomplete intrahepatic ductal decompression.  These images were submitted for radiologic interpretation only. Please see the procedural report for the amount of contrast and the  fluoroscopy time utilized.  Original Report Authenticated By: Harrel Lemon, M.D.     ASSESSMENT / PLAN:  Obstructive jaundice, pancreatic mass (likely cancer). Patient had ERCP with brushings and stent placement yesterday by Dr. Christella Hartigan. LFTs better today. No significant abdominal pain. Dr. Donell Beers following. Patient will be seeing Dr. Truett Perna, his office will arrange for an appointment. I spoke with Dr. Christella Hartigan, patient may go home today, Dr. Christella Hartigan will call her with path results.    LOS: 1 day   Willette Cluster  03/25/2012, 10:42 AM

## 2012-03-27 ENCOUNTER — Other Ambulatory Visit: Payer: Self-pay | Admitting: *Deleted

## 2012-03-27 ENCOUNTER — Telehealth: Payer: Self-pay | Admitting: *Deleted

## 2012-03-27 ENCOUNTER — Ambulatory Visit: Payer: Medicare Other | Admitting: Family Medicine

## 2012-03-27 DIAGNOSIS — K8689 Other specified diseases of pancreas: Secondary | ICD-10-CM

## 2012-03-27 NOTE — Telephone Encounter (Signed)
Message copied by Daphine Deutscher on Fri Mar 27, 2012  2:14 PM ------      Message from: Broughton, Melton Alar      Created: Fri Mar 27, 2012  1:50 PM       Patty,      I spoke with her and her daughter Alona Bene summers) about biopsy.  She needs EUS with FNA (next Thursday, does not need propofol) for pancreatic mass.  Please schedule through her daughter (home [phone] or cell [phone])            Brad,      Can you postpone her new appt with you currently scheduled for next Thursday but I'd like to do EUS/FNA that day.  No tissue diagnosis yet anyway.  I told the family it would be ok.            Thanks

## 2012-03-27 NOTE — Telephone Encounter (Signed)
Spoke with Tobi Bastos at [facility] end[facility] and scheduled patient on 04/02/12 at 10:45 AM. Arrive at 9:45 AM. NPO after midnight. Booking number [phone]. Spoke with patient's daughter Cherie Ouch and gave her this information.

## 2012-03-30 ENCOUNTER — Other Ambulatory Visit: Payer: Self-pay | Admitting: *Deleted

## 2012-03-30 ENCOUNTER — Telehealth: Payer: Self-pay | Admitting: Oncology

## 2012-03-30 NOTE — Telephone Encounter (Signed)
per MD request r/s pts appt on 04/25 to 04/30.  called pts daughter joyce lmovm wt appt d/t and to rtn call to confirm appt

## 2012-03-30 NOTE — Telephone Encounter (Signed)
received a call back from pts daughter Alona Bene lmovm that they will come for appt on 04/25.  rtn call and provided address and to rtn call if they have any questions.  Will fax a letter to Dr. Milinda Antis with appt d/t

## 2012-04-02 ENCOUNTER — Ambulatory Visit: Payer: Medicare Other

## 2012-04-02 ENCOUNTER — Encounter (HOSPITAL_COMMUNITY)
Admission: RE | Disposition: A | Discharge status: Home / Self Care | Payer: Self-pay | Source: Ambulatory Visit | Attending: Gastroenterology

## 2012-04-02 ENCOUNTER — Ambulatory Visit (HOSPITAL_COMMUNITY)
Admission: RE | Admit: 2012-04-02 | Discharge: 2012-04-02 | Disposition: A | Discharge status: Home / Self Care | Payer: Medicare Other | Source: Ambulatory Visit | Attending: Gastroenterology | Admitting: Gastroenterology

## 2012-04-02 ENCOUNTER — Encounter (HOSPITAL_COMMUNITY): Payer: Self-pay | Admitting: *Deleted

## 2012-04-02 ENCOUNTER — Ambulatory Visit: Payer: Medicare Other | Admitting: Oncology

## 2012-04-02 DIAGNOSIS — R933 Abnormal findings on diagnostic imaging of other parts of digestive tract: Secondary | ICD-10-CM

## 2012-04-02 DIAGNOSIS — K831 Obstruction of bile duct: Secondary | ICD-10-CM | POA: Insufficient documentation

## 2012-04-02 DIAGNOSIS — K219 Gastro-esophageal reflux disease without esophagitis: Secondary | ICD-10-CM | POA: Insufficient documentation

## 2012-04-02 DIAGNOSIS — K449 Diaphragmatic hernia without obstruction or gangrene: Secondary | ICD-10-CM | POA: Insufficient documentation

## 2012-04-02 DIAGNOSIS — E785 Hyperlipidemia, unspecified: Secondary | ICD-10-CM | POA: Diagnosis not present

## 2012-04-02 DIAGNOSIS — R17 Unspecified jaundice: Secondary | ICD-10-CM | POA: Insufficient documentation

## 2012-04-02 DIAGNOSIS — K869 Disease of pancreas, unspecified: Secondary | ICD-10-CM | POA: Insufficient documentation

## 2012-04-02 DIAGNOSIS — D136 Benign neoplasm of pancreas: Secondary | ICD-10-CM | POA: Diagnosis not present

## 2012-04-02 DIAGNOSIS — M810 Age-related osteoporosis without current pathological fracture: Secondary | ICD-10-CM | POA: Diagnosis not present

## 2012-04-02 HISTORY — PX: FINE NEEDLE ASPIRATION: SHX5430

## 2012-04-02 HISTORY — PX: EUS: SHX5427

## 2012-04-02 SURGERY — ESOPHAGEAL ENDOSCOPIC ULTRASOUND (EUS) RADIAL
Anesthesia: Moderate Sedation

## 2012-04-02 MED ORDER — FENTANYL CITRATE 0.05 MG/ML IJ SOLN
INTRAMUSCULAR | Status: DC | PRN
  Administered 2012-04-02 (×4): 25 ug via INTRAVENOUS

## 2012-04-02 MED ORDER — MIDAZOLAM HCL 10 MG/2ML IJ SOLN
INTRAMUSCULAR | Status: AC
Start: 2012-04-02 — End: 2012-04-02
  Filled 2012-04-02: qty 4

## 2012-04-02 MED ORDER — SODIUM CHLORIDE 0.9 % IV SOLN: 10.0000 mL | INTRAVENOUS | Status: DC

## 2012-04-02 MED ORDER — FENTANYL CITRATE 0.05 MG/ML IJ SOLN
INTRAMUSCULAR | Status: AC
  Filled 2012-04-02: qty 4

## 2012-04-02 MED ORDER — DIPHENHYDRAMINE HCL 50 MG/ML IJ SOLN
INTRAMUSCULAR | Status: AC
  Filled 2012-04-02: qty 1

## 2012-04-02 MED ORDER — MIDAZOLAM HCL 10 MG/2ML IJ SOLN
INTRAMUSCULAR | Status: DC | PRN
  Administered 2012-04-02 (×2): 1 mg via INTRAVENOUS
  Administered 2012-04-02 (×4): 2 mg via INTRAVENOUS

## 2012-04-02 MED ORDER — SODIUM CHLORIDE 0.9 % IV SOLN
INTRAVENOUS | Status: DC
  Administered 2012-04-02: 10:00:00 via INTRAVENOUS

## 2012-04-02 MED ORDER — BUTAMBEN-TETRACAINE-BENZOCAINE 2-2-14 % EX AERO
INHALATION_SPRAY | CUTANEOUS | Status: DC | PRN
  Administered 2012-04-02: 2 via TOPICAL

## 2012-04-02 NOTE — Interval H&P Note (Signed)
History and Physical Interval Note:  04/02/2012 10:25 AM  Bridget Walker  has presented today for surgery, with the diagnosis of Pancreatic mass  The various methods of treatment have been discussed with the patient and family. After consideration of risks, benefits and other options for treatment, the patient has consented to  Procedure(s) (LRB): ESOPHAGEAL ENDOSCOPIC ULTRASOUND (EUS) RADIAL (N/A) FINE NEEDLE ASPIRATION (FNA) LINEAR (N/A) as a surgical intervention .  The patients' history has been reviewed, patient examined, no change in status, stable for surgery.  I have reviewed the patients' chart and labs.  Questions were answered to the patient's satisfaction.     Rob Bunting

## 2012-04-02 NOTE — Op Note (Signed)
[facility] [address] [place], [place]  [address]  ENDOSCOPIC ULTRASOUND PROCEDURE REPORT  PATIENT:  Bridget Walker, Bridget Walker  MR#:  [id number] BIRTHDATE:  05/19/1925  GENDER:  female ENDOSCOPIST:  Rachael Fee, MD PROCEDURE DATE:  04/02/2012 PROCEDURE:  Upper EUS w/FNA ASA CLASS:  Class III INDICATIONS:  mass in pancreas, recent painless jaundice s/p ERCP with metal stenting of distal CBD stricture, brushings were non-diagnostic MEDICATIONS:   Fentanyl 100 mcg IV, Versed 10 mg IV  DESCRIPTION OF PROCEDURE:   After the risks benefits and alternatives of the procedure were  explained, informed consent was obtained. The patient was then placed in the left, lateral, decubitus postion and IV sedation was administered. Throughout the procedure, the patient's blood pressure, pulse and oxygen saturations were monitored continuously.  Under direct visualization, the [id number] endoscope was introduced through the mouth and advanced to the bulb of duodenum.  Water was used as necessary to provide an acoustic interface.  Upon completion of the imaging, water was removed and the patient was sent to the recovery room in satisfactory condition. <<PROCEDUREIMAGES>>  Endoscopic findings: 1. Normal esophagus, normal stomach, normal duodenal bulb.  I could not advance the scope past the duodenal bulb due to tortuous anatomy.  EUS findings: 1. The previously placed metal biliary stent was in position in bile duct. 2. There was a vague,  approximately 2cm diameter, hypoechoic lesion with very indistinct bordered in uncinate/head of pancreas. This abuts the metal biliary stent.  The lesion was quite deep to the lumen of the duodenal bulb and FNA was therefore technically difficult.  Three passes with 25 guage FNA needles were performed (2 with procore needle, 1 with BS needle). Given the vagueness ofthe lesion, I could not determine if it is invading any nearby signficant blood vessels. 3.  No peripanreatic adenopathy.  Impression: Vague, approximately 2cm diameter hypoechoic lesion in uncinate/head of pancreas. I could not advance the scope past the duodenal bulb due to tortuous anatomy and FNA of the lesion from the bulb was technically difficult, await final cytology results. ______________________________ Rachael Fee, MD  cc: Theodoro Kos, MD; [name], MD  n. eSIGNED:   Rachael Fee at 04/02/2012 12:23 PM  [name], [phone]

## 2012-04-02 NOTE — H&P (View-Only) (Signed)
Chief Complaint  Patient presents with  . Pancreatic Cancer    HISTORY: Pt is 78 year old female who presents with jaundice, weight loss, and abdominal pain.  She had shingles and a cold in dec/january, and she never quite felt like she recovered.  She used to be able to walk a mile or two without stopping, but lately, has been weak and out of breath. She developed jaundice a few weeks ago, and underwent ultrasound followed by CT.  She was found to have an obstructing mass in the pancreas.  She has decreased appetite.  She has had a few episodes of nausea, but feels that most of her weight loss is just because of the appetite.  Her shingles has resolved.  She just sold her house last fall and moved into a retirement community.  She is living independently there.  She has some lower abdominal pain and a "knot" in her groin.  This gets smaller when she lies down.  She denies significant back pain.    Past Medical History  Diagnosis Date  . Osteoporosis   . Hyperlipidemia   . RLS (restless legs syndrome)   . Vitamin d deficiency   . GERD (gastroesophageal reflux disease)   . Overactive bladder   . Cerumen impaction     recurrent  . Cancer     pancreatic  . Unexplained weight loss   . Cough   . Constipation   . Diarrhea   . Nausea   . Weakness   . Shingles   . Arthritis     OA, in hands    Past Surgical History  Procedure Date  . Cataract extraction     patient unsure of date procedure was done.  . Cataract extraction 11/08    2nd cataract    No current facility-administered medications for this visit.   No current outpatient prescriptions on file.   Facility-Administered Medications Ordered in Other Visits  Medication Dose Route Frequency Provider Last Rate Last Dose  . acetaminophen (TYLENOL) tablet 650 mg  650 mg Oral Q6H PRN Rachael Fee, MD   650 mg at 03/24/12 2051   Or  . acetaminophen (TYLENOL) suppository 650 mg  650 mg Rectal Q6H PRN Rachael Fee, MD        . ciprofloxacin (CIPRO) IVPB 400 mg  400 mg Intravenous Once Rachael Fee, MD   400 mg at 03/24/12 1508  . dextrose 5 % and 0.45 % NaCl with KCl 20 mEq/L infusion   Intravenous Continuous Rachael Fee, MD 50 mL/hr at 03/24/12 1902    . ondansetron (ZOFRAN) tablet 4 mg  4 mg Oral Q6H PRN Rachael Fee, MD       Or  . ondansetron [facility]) injection 4 mg  4 mg Intravenous Q6H PRN Rachael Fee, MD      . DISCONTD: 0.9 %  sodium chloride infusion   Intravenous Continuous Rachael Fee, MD 20 mL/hr at 03/24/12 1508    . DISCONTD: butamben-tetracaine-benzocaine (CETACAINE) spray    PRN Rachael Fee, MD   2 spray at 03/24/12 1546  . DISCONTD: fentaNYL (SUBLIMAZE) injection    PRN Rachael Fee, MD   25 mcg at 03/24/12 1618  . DISCONTD: glucagon (GLUCAGEN) injection    PRN Rachael Fee, MD   0.5 mg at 03/24/12 1618  . DISCONTD: midazolam (VERSED) injection    PRN Rachael Fee, MD   2.5 mg at 03/24/12 [phone]  Allergies  Allergen Reactions  . Hydrocodone Other (See Comments)    Patient unsure of reaction, but it may have been diarrhea.  . Nsaids Other (See Comments)    GI upset  . Penicillins Rash    On scalp     Family History  Problem Relation Age of Onset  . Diabetes Sister   . Hypertension Sister   . Heart disease Brother   . Fibromyalgia Daughter   . Alcohol abuse Son   . Heart disease Brother   . Diabetes Brother   . Heart disease Mother   . Pneumonia Father   . Cancer Other     niece had breast and bone cancer     History   Social History  . Marital Status: Widowed    Spouse Name: N/A    Number of Children: N/A  . Years of Education: N/A   Social History Main Topics  . Smoking status: Former Smoker    Quit date: 03/23/1992  . Smokeless tobacco: Never Used  . Alcohol Use: No  . Drug Use: No  . Sexually Active: No     not applicable   Other Topics Concern  . None   Social History Narrative  . None     REVIEW OF SYSTEMS - PERTINENT  POSITIVES ONLY: 12 point review of systems negative other than HPI and PMH except for cough, constipation, joint pain, weakness, shingles.    EXAM: Filed Vitals:   03/23/12 1336  BP: 122/70  Pulse: 78  Temp: 97.1 F (36.2 C)  Resp: 18    Gen:  No acute distress.   Well groomed. Cachectic Neurological: Alert and oriented to person, place, and time. Coordination normal.  Head: Normocephalic and atraumatic.  Eyes: Conjunctivae are normal. Pupils are equal, round, and reactive to light. No scleral icterus.  Neck: Normal range of motion. Neck supple. No tracheal deviation or thyromegaly present.  Cardiovascular: Normal rate, regular rhythm, normal heart sounds and intact distal pulses.  Exam reveals no gallop and no friction rub.  No murmur heard. Respiratory: Effort normal.  No respiratory distress. No chest wall tenderness. Breath sounds normal.  No wheezes, rales or rhonchi.  GI: Soft. Bowel sounds are normal. The abdomen is soft and nontender.  There is no rebound and no guarding. Right inguinal hernia, reducible.   Musculoskeletal: Normal range of motion. Extremities are nontender.  Lymphadenopathy: No cervical, preauricular, postauricular or axillary adenopathy is present Skin: Skin is jaundiced.  Skin is warm and dry. No rash noted. No diaphoresis. No erythema. No pallor. No clubbing, cyanosis, or edema.   Psychiatric: Normal mood and affect. Behavior is normal. Judgment and thought content normal.    LABORATORY RESULTS: Available labs are reviewed  Tbili 6.1.    RADIOLOGY RESULTS: CT abd/pelvis IMPRESSION:  1. Indistinct somewhat isodense prominence of the uncinate process  of the pancreas and to a lesser degree the head of the pancreas  worrisome for pancreatic carcinoma.  2. Significant dilatation of the common bile duct and intrahepatic  ducts indicating obstruction distally.  3. Recommend ERCP and/or endoscopic ultrasound to assess further  and to exclude a possible  ampullary lesion.  4. Moderately large hiatal hernia.  5. Slight soft tissue prominence within the peritoneal cavity.  Cannot exclude early peritoneal spread of carcinoma.    ASSESSMENT AND PLAN: Weight loss Will need to increase protein intake and will likely need nutrition consult.  Pancreatic cancer Pt with presumed pancreatic cancer based on presentation and age.  Getting ERCP for relief of jaundice and brushings.  If negative, will get EUS for biopsy.  She also will need a CA 19-9  I had a long discussion ( ) with her and her children regarding surgery.  I think she is overall too frail to undergo a whipple at this time.  She became seriously debilitated from getting Shingles and a upper respiratory infection to the point that she was unable to resume her prior activities.  She is currently living independently in a retirement community, and would likely need rehab or skilled nursing facility post op.  She and her family are going to think on it.    She will need referral to oncology and radiation.  If she would like to consider whipple in the future, she could undergo a "neoadjuvant" approach with the understanding that if her health does not improve with stenting and relief of jaundice, then she would transition to palliative chemoradiation.   We discussed the magnitude of the operation and the recovery.  We also discussed possible complications.  She does need a tissue diagnosis in order to proceed with therapy.      She is also at high risk to have carcinomatosis based on CT scan.    Maudry Diego MD Surgical Oncology, General and Endocrine Surgery [facility]      Visit Diagnoses: 1. Weight loss   2. Pancreatic cancer     Primary Care Physician: [name], MD, MD  Referring: Rob Bunting, MD

## 2012-04-02 NOTE — Discharge Instructions (Signed)

## 2012-04-03 ENCOUNTER — Encounter (HOSPITAL_COMMUNITY): Payer: Self-pay | Admitting: Gastroenterology

## 2012-04-06 ENCOUNTER — Other Ambulatory Visit: Payer: Self-pay

## 2012-04-06 ENCOUNTER — Telehealth: Payer: Self-pay | Admitting: Gastroenterology

## 2012-04-06 DIAGNOSIS — B0230 Zoster ocular disease, unspecified: Secondary | ICD-10-CM | POA: Diagnosis not present

## 2012-04-06 DIAGNOSIS — H2000 Unspecified acute and subacute iridocyclitis: Secondary | ICD-10-CM | POA: Diagnosis not present

## 2012-04-06 DIAGNOSIS — C259 Malignant neoplasm of pancreas, unspecified: Secondary | ICD-10-CM

## 2012-04-06 DIAGNOSIS — Z961 Presence of intraocular lens: Secondary | ICD-10-CM | POA: Diagnosis not present

## 2012-04-06 DIAGNOSIS — H4040X0 Glaucoma secondary to eye inflammation, unspecified eye, stage unspecified: Secondary | ICD-10-CM | POA: Diagnosis not present

## 2012-04-06 MED ORDER — AMYLASE-LIPASE-PROTEASE 66.4-20-75 MU PO CPEP: ORAL_CAPSULE | ORAL | Status: DC

## 2012-04-06 NOTE — Telephone Encounter (Signed)
The pharmacy says they did not receive her creon rx.  I resent and advised the pt's daughter to tell them it was electronically sent.  Pt's daughter agreed

## 2012-04-07 ENCOUNTER — Ambulatory Visit: Payer: Medicare Other

## 2012-04-07 ENCOUNTER — Encounter: Payer: Self-pay | Admitting: Oncology

## 2012-04-07 ENCOUNTER — Telehealth: Payer: Self-pay | Admitting: *Deleted

## 2012-04-07 ENCOUNTER — Ambulatory Visit (HOSPITAL_BASED_OUTPATIENT_CLINIC_OR_DEPARTMENT_OTHER): Payer: Medicare Other

## 2012-04-07 ENCOUNTER — Ambulatory Visit (HOSPITAL_BASED_OUTPATIENT_CLINIC_OR_DEPARTMENT_OTHER): Payer: Medicare Other | Admitting: Oncology

## 2012-04-07 ENCOUNTER — Telehealth: Payer: Self-pay | Admitting: Oncology

## 2012-04-07 VITALS — BP 154/75 | HR 76 | Temp 97.2°F | Ht 61.0 in | Wt 115.9 lb

## 2012-04-07 DIAGNOSIS — C250 Malignant neoplasm of head of pancreas: Secondary | ICD-10-CM

## 2012-04-07 DIAGNOSIS — R634 Abnormal weight loss: Secondary | ICD-10-CM | POA: Diagnosis not present

## 2012-04-07 DIAGNOSIS — E876 Hypokalemia: Secondary | ICD-10-CM

## 2012-04-07 DIAGNOSIS — C259 Malignant neoplasm of pancreas, unspecified: Secondary | ICD-10-CM

## 2012-04-07 DIAGNOSIS — K838 Other specified diseases of biliary tract: Secondary | ICD-10-CM | POA: Diagnosis not present

## 2012-04-07 LAB — CANCER ANTIGEN 19-9: CA 19-9: 18.1 U/mL (ref <35.0)

## 2012-04-07 LAB — COMPREHENSIVE METABOLIC PANEL
ALT: 24 U/L (ref 0–35)
AST: 25 U/L (ref 0–37)
CO2: 28 mEq/L (ref 19–32)
Chloride: 102 mEq/L (ref 96–112)
Sodium: 142 mEq/L (ref 135–145)
Total Bilirubin: 2 mg/dL — ABNORMAL HIGH (ref 0.3–1.2)
Total Protein: 7.2 g/dL (ref 6.0–8.3)

## 2012-04-07 MED ORDER — POTASSIUM CHLORIDE CRYS ER 20 MEQ PO TBCR: 20.0000 meq | EXTENDED_RELEASE_TABLET | Freq: Every day | ORAL | Status: DC

## 2012-04-07 NOTE — Progress Notes (Signed)
Referring MD: Aron Baba 78 y.o.  April 08, 1925    Reason for Referral: Pancreas cancer     HPI: She reports being diagnosed with "shingles "over the left head and face in January of this year. This was followed by an upper respiratory infection. She then developed anorexia and salt Dr. Milinda Antis. She was noted to have jaundice. She was referred for an abdominal ultrasound on 03/20/2012. A rounded mass was noted at the head of the pancreas. The more distal pancreatic duct was slightly dilated the common bile duct and intrahepatic ducts were dilated.  A CT of the abdomen on 03/20/2012 revealed a low attenuation lesion within the uncinate process of the pancreas with dilatation of the intrahepatic ducts. No invasion of adjacent vasculature was noted. No focal hepatic lesion. No adenopathy was seen. Slight "stranding is "of the soft tissues of anterior peritoneal cavity was noted. She was referred to Dr. Brooke Pace underwent an ERCP on 03/24/2012. A distal common bile duct stricture was noted. This lesion was brushed for cytology and a biliary stent was placed. The cytology revealed reactive changes with no malignant cells identified. She was taken to an endoscopic ultrasound procedure on 04/02/2012. The esophagus, stomach, and duodenal bulb appeared normal. A vague 2 cm hypoechoic lesion was noted in the uncinate 8/head of the pancreas. The lesion was deep to the lumen of the duodenum making it difficult to biopsy. The lesion was biopsied with FNA needles and the cytology did not reveal malignant cells.  The jaundice has resolved following placement of the biliary stent. She saw Dr. Donell Beers and surgery was not recommended.  Past Medical History  Diagnosis Date  . Osteoporosis   . Hyperlipidemia   . RLS (restless legs syndrome)   . Vitamin d deficiency   . GERD (gastroesophageal reflux disease)   . Overactive bladder   . Cerumen impaction     recurrent  . Cancer  April 2013    pancreatic    G3 P3                        . Shingles   . Arthritis     OA, in hands    Past Surgical History  Procedure Date  . Cataract extraction     patient unsure of date procedure was done.  . Cataract extraction 11/08    2nd cataract  . Ercp 03/24/2012    Procedure: ENDOSCOPIC RETROGRADE CHOLANGIOPANCREATOGRAPHY (ERCP);  Surgeon: Rachael Fee, MD;  Location: [facility];  Service: Endoscopy;  Laterality: N/A;  . Eus 04/02/2012    Procedure: ESOPHAGEAL ENDOSCOPIC ULTRASOUND (EUS) RADIAL;  Surgeon: Rachael Fee, MD;  Location: [facility];  Service: Endoscopy;  Laterality: N/A;  EUS with FNA  . Fine needle aspiration 04/02/2012    Procedure: FINE NEEDLE ASPIRATION (FNA) LINEAR;  Surgeon: Rachael Fee, MD;  Location: [facility];  Service: Endoscopy;  Laterality: N/A;    Family History  Problem Relation Age of Onset  . Diabetes Sister   . Hypertension Sister   . Heart disease Brother   . Fibromyalgia Daughter   . Alcohol abuse Son   . Heart disease Brother   . Diabetes Brother   . Heart disease Mother   . Pneumonia Father   . Cancer Other  24     niece had breast and bone cancer   . Niece with "cancer "at age 75  Current outpatient prescriptions:Calcium Carbonate-Vit D-Min 600-400 MG-UNIT TABS,  Take 1 tablet by mouth daily.  , Disp: , Rfl: ;  Cholecalciferol 4000 UNITS CAPS, Take 1 capsule by mouth daily.  , Disp: , Rfl: ;  esomeprazole (NEXIUM) 40 MG capsule, Take 1 capsule (40 mg total) by mouth daily before breakfast., Disp: 90 capsule, Rfl: 3;  meloxicam (MOBIC) 7.5 MG tablet, Take 7.5 mg by mouth daily as needed. For arthritic pain, Disp: , Rfl:  Multiple Vitamin (MULTIVITAMIN) tablet, Take 1 tablet by mouth daily.  , Disp: , Rfl: ;  amylase-lipase-protease (PANGESTYME CN-20) 66.03-28-74 MU per capsule, 3 PILLS WITH EVERY MEAL AND 2 WITH EVERY SNACK, Disp: 250 capsule, Rfl: 4;  potassium chloride SA (K-DUR,KLOR-CON) 20 MEQ tablet, Take 1 tablet (20 mEq  total) by mouth daily. Take one tablet twice on 04/07/12, then one by mouth daily, Disp: 32 tablet, Rfl: 1  Allergies:  Allergies  Allergen Reactions  . Hydrocodone Other (See Comments)    Patient unsure of reaction, but it may have been diarrhea.  . Nsaids Other (See Comments)    GI upset  . Penicillins Rash    On scalp    Social History: She lives in a retirement community in [place]. She quit smoking cigarettes 20 years ago. She does not use alcohol. No transfusion history. She denies risk factors for hepatitis.    History  Alcohol Use No    History  Smoking status  . Former Smoker  . Quit date: 03/23/1992  Smokeless tobacco  . Never Used     ROS:   Positives include: 15 pound weight loss-improved appetite following the standard placement, intermittent nausea and vomiting 2 months ago, dark urine prior to the bile duct stent placement, she continues to have light-colored and Oil type stool, a few episodes of upper abdominal pain  A complete ROS was otherwise negative.  Physical Exam:  Blood pressure 154/75, pulse 76, temperature 97.2 F (36.2 C), temperature source Oral, height 5\' 1"  (1.549 m), weight 115 lb 14.4 oz (52.572 kg).  HEENT: Oropharynx without visible mass, neck without mass Lungs: Clear bilaterally Cardiac: Regular rate and rhythm Abdomen: No hepatosplenomegaly, nontender, no mass  Vascular: No leg edema Lymph nodes: No cervical, supraclavicular, axillary, or inguinal nodes Neurologic: Alert and oriented, the motor exam appears grossly intact Skin: Faint jaundice   LAB:  CBC  Lab Results  Component Value Date   WBC 4.5 03/18/2012   HGB 11.1* 03/18/2012   HCT 33.9* 03/18/2012   MCV 89.5 03/18/2012   PLT 298.0 03/18/2012     CMP      Component Value Date/Time   NA 142 04/07/2012 1540   K 2.5* 04/07/2012 1540   CL 102 04/07/2012 1540   CO2 28 04/07/2012 1540   GLUCOSE 101* 04/07/2012 1540   BUN 9 04/07/2012 1540   CREATININE 0.48*  04/07/2012 1540   CALCIUM 8.8 04/07/2012 1540   PROT 7.2 04/07/2012 1540   ALBUMIN 3.5 04/07/2012 1540   AST 25 04/07/2012 1540   ALT 24 04/07/2012 1540   ALKPHOS 192* 04/07/2012 1540   BILITOT 2.0* 04/07/2012 1540   GFRNONAA 88* 03/24/2012 1435   GFRAA >90 03/24/2012 1435     Radiology: As per history of present illness   Assessment/Plan:   1. Pancreas head mass-status post an ERCP brushing and EUS biopsy that were nondiagnostic  2. Obstructive jaundice secondary to pancreas head mass-status post placement of a bile duct stent 03/24/2012  3. Weight loss   Disposition:    She presented with obstructive  jaundice secondary to a pancreas head mass. I explained the high likelihood of pancreas cancer despite the negative ERCP and EUS cytologies. We obtained a CA 19-9-9 today.  She does not appear to be a candidate for surgical resection based on her age and current performance status. No other therapy will be curative. Her symptoms have been palliated with placement of the bile duct stent. I do not recommend chemotherapy or radiation at present.  The patient and her family are comfortable with an observation approach for now. She will return for an office visit in one month. We will consider continued observation versus a restaging x-ray evaluation based on her performance status/symptoms when she returns next month. We could consider a transabdominal pancreas biopsy, but this would not change treatment recommendations.  Severino Paolo 04/07/2012, 6:18 PM

## 2012-04-07 NOTE — Telephone Encounter (Signed)
K+ level 2.5. Instructed patient to begin Ktab--take twice today, then one daily. Will recheck next month. She will have her son pick up script now.

## 2012-04-07 NOTE — Telephone Encounter (Signed)
appts made and printed for pt aom °

## 2012-04-07 NOTE — Progress Notes (Signed)
Patient came in today as new patient, with daughter and son, patient has two insurances, patient was not in need of financial assistance at this time.

## 2012-04-07 NOTE — Progress Notes (Signed)
Met with patient and her 2 children, introduced self and navigator role.  Provided education to patient on pancreatic cancer and resource information with phone numbers.  Referral made to dietician for weight and decrease appetite. Patient and family were very appreciative and without questions.  Encouraged patient to call me for any assistance.  Will continue to follow.

## 2012-04-08 ENCOUNTER — Telehealth: Payer: Self-pay | Admitting: *Deleted

## 2012-04-08 ENCOUNTER — Telehealth: Payer: Self-pay | Admitting: Oncology

## 2012-04-08 NOTE — Telephone Encounter (Signed)
called pt and informed her that we added a lab for 05/30.

## 2012-04-08 NOTE — Telephone Encounter (Signed)
Call from pt asking to clarify medications. Was unsure what Creon was, and having difficulty swallowing potassium pills. Instructed her to dissolve in a few drops of water. She verbalized understanding.

## 2012-04-09 ENCOUNTER — Telehealth: Payer: Self-pay | Admitting: Gastroenterology

## 2012-04-09 ENCOUNTER — Other Ambulatory Visit: Payer: Self-pay

## 2012-04-09 ENCOUNTER — Telehealth: Payer: Self-pay | Admitting: *Deleted

## 2012-04-09 MED ORDER — AMYLASE-LIPASE-PROTEASE 66.4-20-75 MU PO CPEP: ORAL_CAPSULE | ORAL | Status: DC

## 2012-04-09 NOTE — Telephone Encounter (Signed)
Daughter and P.O.A. Cherie Ouch called reporting [facility] called her stating the digestive medicine Dr. Truett Perna called in they are not able to get.  Also asked for lab results from earlier this week.  [facility] in [place] at [address]  The Creon was ordered by Dr. Christella Hartigan and they've faxed a request for him that creon is available in 3, 6, 12 or 24 not 20 mg which he ordered.  Pharmacy has not heard back about this medicine.    Alona Bene would like a call with labs results after MD has reviewed them.

## 2012-04-09 NOTE — Telephone Encounter (Signed)
Dr Christella Hartigan the pharmacy called and advised that the Creon 20 is not available they gave her Creon 12.  The other option is Creon 24 which would you like her to use?

## 2012-04-10 ENCOUNTER — Telehealth: Payer: Self-pay | Admitting: *Deleted

## 2012-04-10 MED ORDER — PANCRELIPASE (LIP-PROT-AMYL) 24000-76000 UNITS PO CPEP: ORAL_CAPSULE | ORAL | Status: DC

## 2012-04-10 NOTE — Telephone Encounter (Signed)
Message copied by Wandalee Ferdinand on Fri Apr 10, 2012  7:38 PM ------      Message from: Thornton Papas B      Created: Thu Apr 09, 2012  9:52 AM       Please call patient, call daughter, tumor marker is normal, it is still very likely that she has pancreas cancer, f/u as scheduled

## 2012-04-10 NOTE — Telephone Encounter (Signed)
Correct dosage was sent to the pharmacy, the pt was instructed to finish the creon 12 at double the dose until finished.

## 2012-04-10 NOTE — Telephone Encounter (Signed)
Creon 24, otherwise same dosing instructions.  thanks

## 2012-04-13 ENCOUNTER — Telehealth: Payer: Self-pay | Admitting: *Deleted

## 2012-04-13 ENCOUNTER — Telehealth: Payer: Self-pay | Admitting: Gastroenterology

## 2012-04-13 NOTE — Telephone Encounter (Signed)
Message copied by Wandalee Ferdinand on Mon Apr 13, 2012  6:16 PM ------      Message from: Thornton Papas B      Created: Thu Apr 09, 2012  9:52 AM       Please call patient, call daughter, tumor marker is normal, it is still very likely that she has pancreas cancer, f/u as scheduled

## 2012-04-13 NOTE — Telephone Encounter (Signed)
Pt has been having nausea with creon her daughter was calling to let us know she was better and would call if any changes in symptoms

## 2012-04-13 NOTE — Telephone Encounter (Signed)
Reviewed lab results with daughter and need to follow up in 1 month. Continue K+ supplementation.

## 2012-04-13 NOTE — Telephone Encounter (Signed)
Pt has been notified that the correct dosage of her medication has been sent to the pharmacy

## 2012-04-15 ENCOUNTER — Ambulatory Visit: Payer: Medicare Other | Admitting: Nutrition

## 2012-04-15 NOTE — Assessment & Plan Note (Signed)
MEDICAL DIAGNOSIS:  Bridget Walker is an 78 year old female patient of Dr. Truett Perna, diagnosed with pancreatic cancer.  MEDICAL HISTORY INCLUDES:  Vitamin D deficiency, hypercholesterolemia, GERD, diverticulosis, osteoporosis and shingles.  MEDICATIONS INCLUDE:  Calcium, vitamin D, Nexium, multivitamin, and Pangestyme.  LABS:  Include potassium of 2.5 and glucose of 101 on April 30th.  HEIGHT:  61 inches. WEIGHT:  112.6 pounds. USUAL BODY WEIGHT:  133 pounds in December. BMI:  21.29.  PATIENT STATES:  She was having poor appetite and weight loss associated with excessive diarrhea.  She no longer has diarrhea after taking the pancreatic enzymes, but she is unable to gain any weight.  She does not really have food intolerances  at this time but does complain of abdominal pain when she lays flat at nighttime.  She is looking for strategies to help her with weight her nutritional status.  NUTRITION DIAGNOSIS:  Unintended weight loss related to altered GI function as evidenced by a 15% weight loss in the last 5 months.   Patient meets criteria for severe malnutrition in the context of chronic illness secondary to a 15% weight loss in the last 4-1/2 months and severe depletion of both body fat and muscle mass.  INTERVENTION:  I educated the patient and daughter on strategies for increasing protein calorie intake in small frequent meals throughout the day.  I have encouraged her to modify her fat intake and try to choose lower fat items to see if it makes a difference in terms of her abdominal pain.  I have offered suggestions for increasing protein sources and provided her with some juice-based nutrition supplements and Ensure shakes that she could consume between meals for additional calories and protein.  I provided her with fact sheets to take with her today on strategies for dealing with diarrhea and increasing calories, and I have provided her with my contact information. I have answered her  questions.  MONITORING/EVALUATION (GOALS):  That patient will tolerate increased calories and protein to replete lean body mass.  NEXT VISIT:  Thursday, May 30th, after her doctor's visit.    ______________________________ Bridget Walker, RD, LDN Clinical Nutrition Specialist BN/MEDQ  D:  04/15/2012  T:  04/15/2012  Job:  [id number]

## 2012-04-16 ENCOUNTER — Telehealth: Payer: Self-pay | Admitting: Gastroenterology

## 2012-04-16 NOTE — Telephone Encounter (Signed)
Has pain in mid abd when she lays down and then has to vomit.  It has happened several times after eating a high fat meal.  She has a full feeling.  She is on Nexium only 1 time daily.  I advised the pt's daughter to have her watch her mothers meals and avoid high fat meals, avoid laying down for several hours after eating and elevate the head of her bed.  She is also going to try Nexium twice daily.  She was also drinking a lot of Orange juice through out the day.  I advised that the acid in the juice will also contribute to her symptoms.  I will forward to Dr Christella Hartigan for review and further recommendations.

## 2012-04-17 NOTE — Telephone Encounter (Signed)
i agree with those recommendations.  Thanks

## 2012-04-23 ENCOUNTER — Telehealth: Payer: Self-pay

## 2012-04-23 NOTE — Telephone Encounter (Signed)
Pt had a cold after having shingles. Pt has productive cough with clear phlegm; nurse at [facility] told pt she could hear little noise on lt side of chest.No fever, no SOB and no wheezing. Pt wants to know if can take Claritin and or Bendadryl with all the other medicine she is taking.Pt last seen 03/18/12.[facility] needed and pt can be reached at [phone]. Pt said would appreciate call Friday before 3 pm.

## 2012-04-24 ENCOUNTER — Telehealth: Payer: Self-pay | Admitting: Nutrition

## 2012-04-24 NOTE — Telephone Encounter (Signed)
Daughter called concerned with patient's nutrition.  Patient continues to have difficulty eating and tolerates mostly fruit and a little cheese.  She doesn't care for the nutrition supplements much but does take some.  Patient continues to lose weight.  I provided support and encouragement to daughter to be sure patient had high calorie, high protein foods available to choose throughout the day.  I enforced low fat diet and small frequent snacks/meals.  Daughter appreciative of support.

## 2012-05-07 ENCOUNTER — Emergency Department (HOSPITAL_COMMUNITY)
Admission: EM | Admit: 2012-05-07 | Discharge: 2012-05-08 | Disposition: A | Discharge status: Home / Self Care | Payer: Medicare Other | Attending: Emergency Medicine | Admitting: Emergency Medicine

## 2012-05-07 ENCOUNTER — Ambulatory Visit (HOSPITAL_BASED_OUTPATIENT_CLINIC_OR_DEPARTMENT_OTHER): Payer: Medicare Other | Admitting: Oncology

## 2012-05-07 ENCOUNTER — Other Ambulatory Visit: Payer: Self-pay | Admitting: Oncology

## 2012-05-07 ENCOUNTER — Emergency Department (HOSPITAL_COMMUNITY): Payer: Medicare Other

## 2012-05-07 ENCOUNTER — Other Ambulatory Visit: Payer: Self-pay

## 2012-05-07 ENCOUNTER — Ambulatory Visit: Payer: Medicare Other | Admitting: Nutrition

## 2012-05-07 ENCOUNTER — Other Ambulatory Visit: Payer: Self-pay | Admitting: *Deleted

## 2012-05-07 ENCOUNTER — Ambulatory Visit (HOSPITAL_COMMUNITY)
Admission: RE | Admit: 2012-05-07 | Discharge: 2012-05-07 | Disposition: A | Discharge status: Home / Self Care | Payer: Medicare Other | Source: Ambulatory Visit | Attending: Oncology | Admitting: Oncology

## 2012-05-07 ENCOUNTER — Other Ambulatory Visit: Payer: Medicare Other | Admitting: Lab

## 2012-05-07 ENCOUNTER — Encounter (HOSPITAL_COMMUNITY): Payer: Self-pay | Admitting: *Deleted

## 2012-05-07 VITALS — BP 102/74 | HR 49 | Temp 96.9°F | Ht 61.0 in | Wt 104.8 lb

## 2012-05-07 DIAGNOSIS — C250 Malignant neoplasm of head of pancreas: Secondary | ICD-10-CM | POA: Diagnosis not present

## 2012-05-07 DIAGNOSIS — E785 Hyperlipidemia, unspecified: Secondary | ICD-10-CM | POA: Insufficient documentation

## 2012-05-07 DIAGNOSIS — C259 Malignant neoplasm of pancreas, unspecified: Secondary | ICD-10-CM | POA: Diagnosis not present

## 2012-05-07 DIAGNOSIS — R059 Cough, unspecified: Secondary | ICD-10-CM | POA: Insufficient documentation

## 2012-05-07 DIAGNOSIS — J9819 Other pulmonary collapse: Secondary | ICD-10-CM | POA: Insufficient documentation

## 2012-05-07 DIAGNOSIS — M810 Age-related osteoporosis without current pathological fracture: Secondary | ICD-10-CM | POA: Diagnosis not present

## 2012-05-07 DIAGNOSIS — J189 Pneumonia, unspecified organism: Secondary | ICD-10-CM | POA: Diagnosis not present

## 2012-05-07 DIAGNOSIS — I517 Cardiomegaly: Secondary | ICD-10-CM | POA: Diagnosis not present

## 2012-05-07 DIAGNOSIS — G2581 Restless legs syndrome: Secondary | ICD-10-CM | POA: Diagnosis not present

## 2012-05-07 DIAGNOSIS — R634 Abnormal weight loss: Secondary | ICD-10-CM | POA: Diagnosis not present

## 2012-05-07 DIAGNOSIS — K449 Diaphragmatic hernia without obstruction or gangrene: Secondary | ICD-10-CM | POA: Insufficient documentation

## 2012-05-07 DIAGNOSIS — K219 Gastro-esophageal reflux disease without esophagitis: Secondary | ICD-10-CM | POA: Insufficient documentation

## 2012-05-07 DIAGNOSIS — E876 Hypokalemia: Secondary | ICD-10-CM

## 2012-05-07 DIAGNOSIS — I700 Atherosclerosis of aorta: Secondary | ICD-10-CM | POA: Insufficient documentation

## 2012-05-07 DIAGNOSIS — R911 Solitary pulmonary nodule: Secondary | ICD-10-CM | POA: Diagnosis not present

## 2012-05-07 DIAGNOSIS — R05 Cough: Secondary | ICD-10-CM | POA: Insufficient documentation

## 2012-05-07 DIAGNOSIS — K838 Other specified diseases of biliary tract: Secondary | ICD-10-CM

## 2012-05-07 DIAGNOSIS — R0602 Shortness of breath: Secondary | ICD-10-CM | POA: Diagnosis not present

## 2012-05-07 DIAGNOSIS — K228 Other specified diseases of esophagus: Secondary | ICD-10-CM | POA: Diagnosis not present

## 2012-05-07 LAB — CMP AND LIVER
ALT: 18 U/L (ref 0–35)
AST: 19 U/L (ref 0–37)
Albumin: 3.8 g/dL (ref 3.5–5.2)
Alkaline Phosphatase: 97 U/L (ref 39–117)
BUN: 18 mg/dL (ref 6–23)
Chloride: 101 mEq/L (ref 96–112)
Potassium: 4.4 mEq/L (ref 3.5–5.3)

## 2012-05-07 MED ORDER — MEGESTROL ACETATE 40 MG/ML PO SUSP: 200.0000 mg | Freq: Two times a day (BID) | ORAL | Status: DC

## 2012-05-07 NOTE — ED Notes (Signed)
Pt ekg changed to trigeminy.  Dr. Patria Mane notified.  No new orders

## 2012-05-07 NOTE — ED Notes (Signed)
Pt states blood clot in lung from cxr.  Here for tx. \

## 2012-05-07 NOTE — Progress Notes (Signed)
Addended by: Wandalee Ferdinand on: 05/07/2012 04:30 PM   Modules accepted: Orders

## 2012-05-07 NOTE — Progress Notes (Signed)
   [facility]    OFFICE PROGRESS NOTE   INTERVAL HISTORY:   She returns as scheduled. She is eating several times per day, but continues to lose weight. She requests an appetite stimulant. No diarrhea. Transient pain in the left flank area. She complains of a morning cough that is occasionally productive. No fever. Fatigue but no significant dyspnea.  Objective:  Vital signs in last 24 hours:  Blood pressure 102/74, pulse 49, temperature 96.9 F (36.1 C), height 5\' 1"  (1.549 m), weight 104 lb 12.8 oz (47.537 kg).   Resp: Inspiratory rhonchi/rails throughout the right lower chest, no respiratory distress Cardio: Irregular, premature beats GI: Nontender, no hepatomegaly, rounded fullness in the right upper abdominal wall Vascular: No leg edema Neuro: Alert and oriented      Lab Results:   CA 19-9 -18.1 on 04/07/2012    Medications: I have reviewed the patient's current medications.  Assessment/Plan: 1. Pancreas head mass-status post an ERCP brushing and EUS biopsy that were nondiagnostic , the clinical history is consistent with pancreas cancer. The patient and her family are comfortable with no further diagnostic of valuation. 2. Obstructive jaundice secondary to pancreas head mass-status post placement of a bile duct stent 03/24/2012  3. Weight loss- she continues to lose weight. She will meet with the cancer Center nutritionist today. We prescribed Megace. 4. Cough-abnormal lung exam today. She will be referred for a chest x-ray.   Disposition:  She does not have diarrhea since starting pancreas enzyme replacement. No significant pain. The plan is to follow her with an observation/supportive care approach. She will be referred for a chest x-ray today.  Ms. Schnackenberg will return for an office visit in one month   Thornton Papas, MD  05/07/2012  4:18 PM

## 2012-05-07 NOTE — ED Notes (Signed)
Pt was seen by PCP today and had XR pt reports that she was called and told to come to ER due to possible PE. Pt denies SOB. Only c/o cough in am.

## 2012-05-07 NOTE — Progress Notes (Signed)
Bridget Walker and her daughter and son present to nutrition followup.  She has continued to lose weight.  Her weight was documented as 104.8 pounds which is down from 112.6 pounds.  She reports that she has tolerated the juice-based supplements.  However, does not tolerate Ensure or Boost. She does not like the taste of supplements that have a lot of protein in them. She states that they tastes terrible.  She does not have an appetite which makes it difficult for her to remember to eat.  She states her physician has prescribed an appetite stimulant for her today.  She denies problems with diarrhea or any other nutrition issues.  NUTRITION DIAGNOSIS:  Unintended weight loss continues.  INTERVENTION:  I encouraged the patient to the consume 3 nutrition supplements every day and I have talked to her about ways to incorporate these into what she is currently eating.  Hopefully, the appetite stimulant will encourage an appetite and she will be more inclined to increase her overall intake.  The patient has always eaten a very healthy diet with lots of vegetables generally low in calories so this is very difficult for her to add high-calorie foods.  I have provided some additional supplements for her to take with her today.  MONITORING, EVALUATION, AND GOALS:  The patient has been unable to increase calories and protein to minimize any weight loss.  She will continue to work towards this.  NEXT VISIT:  I have not scheduled a followup with the patient at this time.  Her daughter will call me with questions and concerns.    ______________________________ Zenovia Jarred, RD, LDN Clinical Nutrition Specialist BN/MEDQ  D:  05/07/2012  T:  05/07/2012  Job:  [id number]

## 2012-05-07 NOTE — Telephone Encounter (Signed)
Gave pt appt calendar for June 2013 MD only per Dr. Truett Perna

## 2012-05-08 ENCOUNTER — Other Ambulatory Visit: Payer: Self-pay | Admitting: Oncology

## 2012-05-08 DIAGNOSIS — R05 Cough: Secondary | ICD-10-CM | POA: Diagnosis not present

## 2012-05-08 DIAGNOSIS — R911 Solitary pulmonary nodule: Secondary | ICD-10-CM | POA: Diagnosis not present

## 2012-05-08 DIAGNOSIS — K228 Other specified diseases of esophagus: Secondary | ICD-10-CM | POA: Diagnosis not present

## 2012-05-08 LAB — DIFFERENTIAL
Basophils Absolute: 0 10*3/uL (ref 0.0–0.1)
Eosinophils Absolute: 0.1 10*3/uL (ref 0.0–0.7)
Monocytes Relative: 10 % (ref 3–12)
Neutro Abs: 3.9 10*3/uL (ref 1.7–7.7)
Neutrophils Relative %: 61 % (ref 43–77)

## 2012-05-08 LAB — BASIC METABOLIC PANEL
BUN: 21 mg/dL (ref 6–23)
Calcium: 9.8 mg/dL (ref 8.4–10.5)
GFR calc Af Amer: 88 mL/min — ABNORMAL LOW (ref >90)
GFR calc non Af Amer: 76 mL/min — ABNORMAL LOW (ref >90)
Potassium: 3.9 mEq/L (ref 3.5–5.1)
Sodium: 134 mEq/L — ABNORMAL LOW (ref 135–145)

## 2012-05-08 LAB — CBC
Hemoglobin: 11.9 g/dL — ABNORMAL LOW (ref 12.0–15.0)
MCHC: 33.4 g/dL (ref 30.0–36.0)
Platelets: 320 10*3/uL (ref 150–400)
RBC: 4.13 MIL/uL (ref 3.87–5.11)

## 2012-05-08 MED ORDER — MOXIFLOXACIN HCL 400 MG PO TABS
400.0000 mg | ORAL_TABLET | Freq: Once | ORAL | Status: AC
  Administered 2012-05-08: 400 mg via ORAL
  Filled 2012-05-08: qty 1

## 2012-05-08 MED ORDER — MOXIFLOXACIN HCL 400 MG PO TABS: 400.0000 mg | ORAL_TABLET | Freq: Every day | ORAL | Status: AC

## 2012-05-08 MED ORDER — IOHEXOL 300 MG/ML  SOLN
100.0000 mL | Freq: Once | INTRAMUSCULAR | Status: AC | PRN
  Administered 2012-05-08: 100 mL via INTRAVENOUS

## 2012-05-08 NOTE — ED Provider Notes (Signed)
History     CSN: [id number]  Arrival date & time 05/07/12  2100   First MD Initiated Contact with Patient 05/07/12 2301      Chief Complaint  Patient presents with  . Cough    (Consider location/radiation/quality/duration/timing/severity/associated sxs/prior treatment) The history is provided by the patient and medical records.   The patient is being seen by her physician today for routine evaluation and abnormal lung sounds are noted on the right and a chest x-ray was obtained demonstrating possible infarction versus infiltrate she was sent to the emergency department for evaluation for possible pulmonary embolism.  She reports she is currently being worked up for possible pancreatic cancer without a clear diagnosis at this time.  She used to be a smoker but no longer smokes.  She does report intermittent cough over the past several months.  She has had a 2025 pound weight loss over the past 6-8 months.  She denies blood in her sputum.  She has no fevers or chills.  She denies shortness of breath.  She denies exertional shortness of breath.  She has no unilateral leg swelling.  She denies chest pain.  She has no pleuritic chest pain.  She reports she felt fine today when she went to the physician Past Medical History  Diagnosis Date  . Osteoporosis   . Hyperlipidemia   . RLS (restless legs syndrome)   . Vitamin d deficiency   . GERD (gastroesophageal reflux disease)   . Overactive bladder   . Cerumen impaction     recurrent  . Cancer     pancreatic  . Unexplained weight loss   . Cough   . Constipation   . Diarrhea   . Nausea   . Weakness   . Shingles   . Arthritis     OA, in hands    Past Surgical History  Procedure Date  . Cataract extraction     patient unsure of date procedure was done.  . Cataract extraction 11/08    2nd cataract  . Ercp 03/24/2012    Procedure: ENDOSCOPIC RETROGRADE CHOLANGIOPANCREATOGRAPHY (ERCP);  Surgeon: Rachael Fee, MD;  Location: [facility];  Service: Endoscopy;  Laterality: N/A;  . Eus 04/02/2012    Procedure: ESOPHAGEAL ENDOSCOPIC ULTRASOUND (EUS) RADIAL;  Surgeon: Rachael Fee, MD;  Location: [facility] ENDOSCOPY;  Service: Endoscopy;  Laterality: N/A;  EUS with FNA  . Fine needle aspiration 04/02/2012    Procedure: FINE NEEDLE ASPIRATION (FNA) LINEAR;  Surgeon: Rachael Fee, MD;  Location: [facility] ENDOSCOPY;  Service: Endoscopy;  Laterality: N/A;    Family History  Problem Relation Age of Onset  . Diabetes Sister   . Hypertension Sister   . Heart disease Brother   . Fibromyalgia Daughter   . Alcohol abuse Son   . Heart disease Brother   . Diabetes Brother   . Heart disease Mother   . Pneumonia Father   . Cancer Other     niece had breast and bone cancer    History  Substance Use Topics  . Smoking status: Former Smoker    Quit date: 03/23/1992  . Smokeless tobacco: Never Used  . Alcohol Use: No    OB History    Grav Para Term Preterm Abortions TAB SAB Ect Mult Living                  Review of Systems  All other systems reviewed and are negative.    Allergies  Hydrocodone; Nsaids; and  Penicillins  Home Medications   Current Outpatient Rx  Name Route Sig Dispense Refill  . CALCIUM CARBONATE-VIT D-MIN 600-400 MG-UNIT PO TABS Oral Take 1 tablet by mouth daily.      Marland Kitchen ESOMEPRAZOLE MAGNESIUM 40 MG PO CPDR Oral Take 1 capsule (40 mg total) by mouth daily before breakfast. 90 capsule 3  . MEGESTROL ACETATE 40 MG/ML PO SUSP Oral Take 5 mLs (200 mg total) by mouth 2 (two) times daily. 300 mL 2  . MELOXICAM 7.5 MG PO TABS Oral Take 7.5 mg by mouth daily as needed. For arthritic pain    . ONE-DAILY MULTI VITAMINS PO TABS Oral Take 1 tablet by mouth daily.      Marland Kitchen PANCRELIPASE (LIP-PROT-AMYL) 24000 UNITS PO CPEP  3 pills with every meal and 2 with every snack 250 capsule 3  . POTASSIUM CHLORIDE CRYS ER 20 MEQ PO TBCR Oral Take 1 tablet (20 mEq total) by mouth daily. Take one tablet twice on 04/07/12, then one  by mouth daily 32 tablet 1  . MOXIFLOXACIN HCL 400 MG PO TABS Oral Take 1 tablet (400 mg total) by mouth daily. 10 tablet 0    BP 99/65  Pulse 99  Temp(Src) 98.1 F (36.7 C) (Oral)  Resp 18  Ht 5\' 1"  (1.549 m)  Wt 104 lb (47.174 kg)  BMI 19.65 kg/m2  SpO2 95%  Physical Exam  Nursing note and vitals reviewed. Constitutional: She is oriented to person, place, and time. She appears well-developed and well-nourished. No distress.  HENT:  Head: Normocephalic and atraumatic.  Eyes: EOM are normal.  Neck: Normal range of motion.  Cardiovascular: Normal rate, regular rhythm and normal heart sounds.   Pulmonary/Chest: Effort normal and breath sounds normal. No respiratory distress.       No tachypnea or accessory muscle use  Abdominal: Soft. She exhibits no distension. There is no tenderness.  Musculoskeletal: Normal range of motion.  Neurological: She is alert and oriented to person, place, and time.  Skin: Skin is warm and dry.  Psychiatric: She has a normal mood and affect. Judgment normal.    ED Course  Procedures (including critical care time)  Labs Reviewed  BASIC METABOLIC PANEL - Abnormal; Notable for the following:    Sodium 134 (*)    Glucose, Bld 118 (*)    GFR calc non Af Amer 76 (*)    GFR calc Af Amer 88 (*)    All other components within normal limits  CBC - Abnormal; Notable for the following:    Hemoglobin 11.9 (*)    HCT 35.6 (*)    All other components within normal limits  DIFFERENTIAL  TROPONIN I  CULTURE, BLOOD (ROUTINE X 2)  CULTURE, BLOOD (ROUTINE X 2)   Dg Chest 2 View  05/07/2012  *RADIOLOGY REPORT*  Clinical Data: Shortness of breath and cough.  History of pancreatic cancer.  CHEST - 2 VIEW  Comparison: No priors.  Findings: There is an area of airspace consolidation in the inferior aspect of the right upper lobe outlining the minor fissure.  Right hilar prominence is also noted.  There is mild cephalad displacement of the minor fissure.  Lungs  otherwise appear clear.  No definite pleural effusions.  No evidence of edema. Heart size is mildly enlarged.  There is a large hiatal hernia. Atherosclerotic calcifications within the arch of the aorta.  IMPRESSION: 1.  Airspace consolidation and some degree of atelectasis in the right upper lobe.  These findings are associated  with right hilar prominence and are concerning for potential right hilar adenopathy or mass and postobstructive atelectasis/pneumonia in the right upper lobe. Another differential consideration would include an acute pulmonary infarction.  For better evaluation of these findings, PE protocol CT scan is recommended at this time. 2.  Atherosclerosis.  These results will be called to the ordering clinician or representative by the Radiologist Assistant, and communication documented in the PACS Dashboard.  Original Report Authenticated By: Florencia Reasons, M.D.   Ct Angio Chest W/cm &/or Wo Cm  05/08/2012  *RADIOLOGY REPORT*  Clinical Data: Cough.  History of pancreatic cancer.  CT ANGIOGRAPHY CHEST  Technique:  Multidetector CT imaging of the chest using the standard protocol during bolus administration of intravenous contrast. Multiplanar reconstructed images including MIPs were obtained and reviewed to evaluate the vascular anatomy.  Contrast: OMNIPAQUE IOHEXOL 300 MG/ML  SOLN  Comparison: Chest radiograph 05/07/2012  Findings:  Technically adequate study with good opacification of the central and segmental pulmonary arteries.  No focal filling defects.  No evidence of significant pulmonary embolus.  Mild cardiac enlargement with passive hepatic congestion.  Gas in the biliary tree with biliary wall stent in place.  Calcification of the splenic hilum suggests a small splenic aneurysm.  This measures about 8 mm diameter. Tortuous aorta with calcification. No aneurysm.  Large esophageal hiatal hernia with most of the stomach above the hemidiaphragms.  Mildly fluid distended  esophagus may represent dysmotility.  Inflammatory changes not excluded.  There is extensive volume loss and consolidation in the right lower lung and right middle lung with air bronchograms.  No definitive evidence of hilar obstructing mass lesion is demonstrated although there is narrowing of the distal aspect of the right main stem bronchus.  Changes likely to represent pneumonia although centrally obstructing mass or adenopathy are not excluded.  Patchy areas of nodular infiltration in the left lung base.  No pneumothorax.  No pleural effusions.  Degenerative changes in the thoracic spine with normal alignment.  No compression deformities.  The no displaced rib fractures or rib lesions.  IMPRESSION: No evidence of significant pulmonary embolus.  The extensive consolidation of the right lower lung and right middle lung with patchy areas of nodular infiltration in the left lower lung. Changes may represent pneumonia.  No definite evidence of a central obstructing lesion although the distal right main stem bronchus is somewhat narrowed and a cold mass may be obscured by the consolidation.  Recommend follow up after resolution of acute symptoms.  Large esophageal hiatal hernia with mild esophageal dilatation.  Esophagitis versus dysmotility.  Biliary wall stent and biliary gas demonstrated in the upper abdomen.  Original Report Authenticated By: Marlon Pel, M.D.     1. CAP (community acquired pneumonia)       MDM   the patient is very well appearing.  Her chest x-ray and a CT scan appear to represent pneumonia.  She's been given a dose of Avelox.  I spoke with the patient and the patient's family at length she has absolutely no respiratory symptoms except for occasional cough.  The patient was having a routine visit with her Dr. and it was only after abnormal lung sounds are found the chest x-ray was obtained.  The patient really has no complaints except for generalized weakness over the past  several months.  She is currently being worked up for pancreatic cancer.  I've instructed the patient and the family to return the emergency department for development of fever  worsening weakness or development of shortness of breath with or without exertion.  Patient and family are agreeable to outpatient plan.  I think the risk of putting her in the hospital outweighs the benefit and I explained This to the patient and the family.  The patient will therefore need an outpatient chest x-ray likely CT scan once this infiltrate is cleared.        Lyanne Co, MD 05/08/12 [phone]

## 2012-05-08 NOTE — Discharge Instructions (Signed)

## 2012-05-11 ENCOUNTER — Telehealth: Payer: Self-pay | Admitting: *Deleted

## 2012-05-11 NOTE — Telephone Encounter (Signed)
Message from pt's daughter to clarify whether pt needed to continue Potassium. Reviewed with Dr. Truett Perna: Stop Potassium. Alona Bene voiced understanding. Also asking if pt would need another scan. NO, per Dr. Truett Perna, will discuss at next office visit.  Daughter is asking how hospice gets involved. Informed her that Dr. Truett Perna will initiate hospice consult when appropriate. Alona Bene doesn't think they need hospice at this time.

## 2012-05-11 NOTE — Telephone Encounter (Signed)
Called pt, lab results given. She verbalized understanding.

## 2012-05-11 NOTE — Telephone Encounter (Signed)
Message copied by Caleb Popp on Mon May 11, 2012 10:50 AM ------      Message from: Ladene Artist      Created: Sat May 09, 2012  8:14 PM       Labs are ok, please call patient

## 2012-05-14 LAB — CULTURE, BLOOD (ROUTINE X 2)
Culture  Setup Time: 201305310333
Culture: NO GROWTH

## 2012-05-25 ENCOUNTER — Telehealth: Payer: Self-pay

## 2012-05-25 ENCOUNTER — Other Ambulatory Visit: Payer: Self-pay

## 2012-05-25 DIAGNOSIS — C259 Malignant neoplasm of pancreas, unspecified: Secondary | ICD-10-CM

## 2012-05-25 NOTE — Telephone Encounter (Signed)
Pt and her daughter have been notified to have labs done prior to her appt

## 2012-05-25 NOTE — Telephone Encounter (Signed)
Message copied by Donata Duff on Mon May 25, 2012  8:48 AM ------      Message from: Donata Duff      Created: Mon Apr 06, 2012  9:05 AM       Pt to get labs

## 2012-05-26 ENCOUNTER — Other Ambulatory Visit (INDEPENDENT_AMBULATORY_CARE_PROVIDER_SITE_OTHER): Payer: Medicare Other

## 2012-05-26 ENCOUNTER — Encounter: Payer: Self-pay | Admitting: Gastroenterology

## 2012-05-26 ENCOUNTER — Ambulatory Visit (INDEPENDENT_AMBULATORY_CARE_PROVIDER_SITE_OTHER): Payer: Medicare Other | Admitting: Gastroenterology

## 2012-05-26 VITALS — BP 104/68 | HR 80 | Ht 61.0 in | Wt 108.2 lb

## 2012-05-26 DIAGNOSIS — C259 Malignant neoplasm of pancreas, unspecified: Secondary | ICD-10-CM

## 2012-05-26 LAB — HEPATIC FUNCTION PANEL
ALT: 24 U/L (ref 0–35)
AST: 25 U/L (ref 0–37)
Albumin: 3.4 g/dL — ABNORMAL LOW (ref 3.5–5.2)

## 2012-05-26 MED ORDER — PANCRELIPASE (LIP-PROT-AMYL) 24000-76000 UNITS PO CPEP: ORAL_CAPSULE | ORAL | Status: DC

## 2012-05-26 NOTE — Progress Notes (Signed)
Review of pertinent gastrointestinal problems: 1. presumed pancreatic cancer.  Presented with painless jaundice, CT scan suggested abnormal area in the head of pancreas though not clearly a mass. Dilated bile duct and intrahepatic duct. ERCP April 2013 found distal bile duct stricture which was brushed and then stented with a 6 cm long uncovered metal stent (tibil max 5, normalized on May 30th lfts). Brushings of the stricture did not show clear cancer. Followup endoscopic ultrasound was incomplete. I could not advance the endoscope passed her duodenal bulb to get good biopsies. I did see a masslike area in the head of pancreas.  She has been seen by doctors Truett Perna and byerly.  Felt not to be surgical candidate and being followed clinically.   HPI: This is a  very pleasant 78 year old woman whom I last saw about 2 months ago.  She has presumed pancreatic cancer, see above.  Remains without jaundice.  Has had pneumonia since then.  She coughs in the morning, sometimes green.  She is on creon, needs a written prescription.  She is weak and "breathy."  Not anemic on labs 2 weeks ago.  LFTs this AM were normal except alb was a bit low.  Has been constipated.  She takes dulcolax stool softners.   She has been gaining weight lately.   Past Medical History  Diagnosis Date  . Osteoporosis   . Hyperlipidemia   . RLS (restless legs syndrome)   . Vitamin d deficiency   . GERD (gastroesophageal reflux disease)   . Overactive bladder   . Cerumen impaction     recurrent  . Cancer     pancreatic  . Unexplained weight loss   . Cough   . Constipation   . Diarrhea   . Nausea   . Weakness   . Shingles   . Arthritis     OA, in hands    Past Surgical History  Procedure Date  . Cataract extraction     patient unsure of date procedure was done.  . Cataract extraction 11/08    2nd cataract  . Ercp 03/24/2012    Procedure: ENDOSCOPIC RETROGRADE CHOLANGIOPANCREATOGRAPHY (ERCP);  Surgeon:  Rachael Fee, MD;  Location: [facility];  Service: Endoscopy;  Laterality: N/A;  . Eus 04/02/2012    Procedure: ESOPHAGEAL ENDOSCOPIC ULTRASOUND (EUS) RADIAL;  Surgeon: Rachael Fee, MD;  Location: [facility];  Service: Endoscopy;  Laterality: N/A;  EUS with FNA  . Fine needle aspiration 04/02/2012    Procedure: FINE NEEDLE ASPIRATION (FNA) LINEAR;  Surgeon: Rachael Fee, MD;  Location: [facility];  Service: Endoscopy;  Laterality: N/A;    Current Outpatient Prescriptions  Medication Sig Dispense Refill  . Calcium Carbonate-Vit D-Min 600-400 MG-UNIT TABS Take 1 tablet by mouth daily.        Marland Kitchen esomeprazole (NEXIUM) 40 MG capsule Take 1 capsule (40 mg total) by mouth daily before breakfast.  90 capsule  3  . megestrol (MEGACE ORAL) 40 MG/ML suspension Take 5 mLs (200 mg total) by mouth 2 (two) times daily.  300 mL  2  . meloxicam (MOBIC) 7.5 MG tablet Take 7.5 mg by mouth daily as needed. For arthritic pain      . Multiple Vitamin (MULTIVITAMIN) tablet Take 1 tablet by mouth daily.        . Pancrelipase, Lip-Prot-Amyl, 24000 UNITS CPEP 3 pills with every meal and 2 with every snack  250 capsule  3    Allergies as of 05/26/2012 - Review Complete 05/26/2012  Allergen Reaction Noted  . Hydrocodone Other (See Comments) 09/23/2007  . Nsaids Other (See Comments) 09/23/2007  . Penicillins Rash 09/23/2007    Family History  Problem Relation Age of Onset  . Diabetes Sister   . Hypertension Sister   . Heart disease Brother   . Fibromyalgia Daughter   . Alcohol abuse Son   . Heart disease Brother   . Diabetes Brother   . Heart disease Mother   . Pneumonia Father   . Cancer Other     niece had breast and bone cancer    History   Social History  . Marital Status: Widowed    Spouse Name: N/A    Number of Children: N/A  . Years of Education: N/A   Occupational History  . Not on file.   Social History Main Topics  . Smoking status: Former Smoker    Quit date: 03/23/1992   . Smokeless tobacco: Never Used  . Alcohol Use: No  . Drug Use: No  . Sexually Active: No     not applicable   Other Topics Concern  . Not on file   Social History Narrative   Resides at [facility] [place] ([facility])Widow      Physical Exam: BP 104/68  Pulse 80  Ht 5\' 1"  (1.549 m)  Wt 108 lb 3.2 oz (49.079 kg)  BMI 20.44 kg/m2 Constitutional: elderly but generally well-appearing Psychiatric: alert and oriented x3 Lungs cta, bilaterally Abdomen: soft, nontender, nondistended, no obvious ascites, no peritoneal signs, normal bowel sounds     Assessment and plan: 78 y.o. female with presumed pancreatic cancer  I recommended she try MiraLax over-the-counter once daily for her constipation. I have given her prescription for Creon again. Her lungs sounded clear which she was concerned about. She knows to call if she has any further questions or concerns and she'll be continued to be followed clinically.

## 2012-05-26 NOTE — Patient Instructions (Addendum)
Start once daily miralax powder, over the counter, for your constipation. New script printed for Creon, continue this every day. Walk daily.

## 2012-05-28 NOTE — Telephone Encounter (Signed)
Her pneumonia vaccine is up to date since last one was after the age of 5 I have been kept up to date on everything, thank you , and I hope she is feeling better

## 2012-05-28 NOTE — Telephone Encounter (Signed)
Was this addressed?

## 2012-05-28 NOTE — Telephone Encounter (Signed)
I'm not at all sure that it was addressed.  I don't see a routing for it other than you creating the note.  Who was it sent to?

## 2012-05-28 NOTE — Telephone Encounter (Signed)
The original message was supposed to be routed  to Dr Milinda Antis. Not sure what happened. Pt said she saw Dr Truett Perna on 05/07/12 and that was why she did not call our office back.  Pt has been seeing Dr Truett Perna and Dr Gerilyn Pilgrim and wanted to make sure Dr Milinda Antis has been getting information from her visits with them. Pt does have pancreatic cancer and did have pneumonia but is doing pretty well now. Pt asked when had pneumonia shot 10/11/2002 and pt wants to know if she needs another pneumonia vaccine.  Pt said to tell Dr Milinda Antis she misses seeing her and appreciates all Dr Milinda Antis has done for her.Please advise. Pt can be reached at [phone] or Bridget Walker pts daughter at [phone].

## 2012-05-29 NOTE — Telephone Encounter (Signed)
Advised patient.  She wants Dr. Milinda Antis to know that she will be going back to see her oncologist next week.

## 2012-06-03 ENCOUNTER — Ambulatory Visit (HOSPITAL_COMMUNITY)
Admission: RE | Admit: 2012-06-03 | Discharge: 2012-06-03 | Disposition: A | Discharge status: Home / Self Care | Payer: Medicare Other | Source: Ambulatory Visit | Attending: Oncology | Admitting: Oncology

## 2012-06-03 ENCOUNTER — Telehealth: Payer: Self-pay | Admitting: Oncology

## 2012-06-03 ENCOUNTER — Ambulatory Visit (HOSPITAL_BASED_OUTPATIENT_CLINIC_OR_DEPARTMENT_OTHER): Payer: Medicare Other | Admitting: Oncology

## 2012-06-03 VITALS — BP 124/60 | HR 49 | Temp 97.0°F | Ht 61.0 in | Wt 108.8 lb

## 2012-06-03 DIAGNOSIS — R05 Cough: Secondary | ICD-10-CM

## 2012-06-03 DIAGNOSIS — K838 Other specified diseases of biliary tract: Secondary | ICD-10-CM

## 2012-06-03 DIAGNOSIS — J189 Pneumonia, unspecified organism: Secondary | ICD-10-CM

## 2012-06-03 DIAGNOSIS — R0602 Shortness of breath: Secondary | ICD-10-CM | POA: Insufficient documentation

## 2012-06-03 DIAGNOSIS — J984 Other disorders of lung: Secondary | ICD-10-CM | POA: Diagnosis not present

## 2012-06-03 DIAGNOSIS — C250 Malignant neoplasm of head of pancreas: Secondary | ICD-10-CM

## 2012-06-03 DIAGNOSIS — J9819 Other pulmonary collapse: Secondary | ICD-10-CM

## 2012-06-03 DIAGNOSIS — Z8509 Personal history of malignant neoplasm of other digestive organs: Secondary | ICD-10-CM | POA: Insufficient documentation

## 2012-06-03 DIAGNOSIS — R918 Other nonspecific abnormal finding of lung field: Secondary | ICD-10-CM | POA: Diagnosis not present

## 2012-06-03 NOTE — Progress Notes (Signed)
   [facility]    OFFICE PROGRESS NOTE   INTERVAL HISTORY:   She returns as scheduled. When she was here on 30th 2013 the right lung exam was abnormal. She was referred for a chest x-ray and this confirmed airspace consolidation and atelectasis in the right upper lobe. She was referred to the emergency room and a CT of the chest was negative for a pulmonary embolism. Volume loss and consolidation was noted in the right lower lung and right middle lung with air bronchograms. No evidence of a hilar obstructing mass was noted. Narrowing was seen at the distal aspect of the right mainstem bronchus. Patchy areas of nodular infiltrate were noted at the left lung base. No pleural effusion.  She completed a 10 day course of antibiotics. She continues to have a cough, mostly in the morning. The cough is occasionally productive. No hemoptysis. She complains of exertional dyspnea.  Her appetite has improved with Megace. The diarrhea improved with pancreatic enzyme replacement. She was recently started on MiraLAX by Dr. Christella Hartigan.  Objective:  Vital signs in last 24 hours:  Blood pressure 124/60, pulse 49, temperature 97 F (36.1 C), temperature source Oral, height 5\' 1"  (1.549 m), weight 108 lb 12.8 oz (49.351 kg), SpO2 95.00%.   Resp: Inspiratory rhonchi/rails at the right lower chest, no respiratory distress at rest. Inspiratory rails at the extreme left base. Cardio: Regular rhythm with an occasional pulse GI: Nontender, no hepatomegaly, no mass Vascular: No leg edema   Lab Results:  Lab Results  Component Value Date   WBC 6.4 05/07/2012   HGB 11.9* 05/07/2012   HCT 35.6* 05/07/2012   MCV 86.2 05/07/2012   PLT 320 05/07/2012      Medications: I have reviewed the patient's current medications.  Assessment/Plan: 1.Pancreas head mass-status post an ERCP brushing and EUS biopsy that were nondiagnostic , the clinical history is consistent with pancreas cancer. The patient and  her family are comfortable with no further diagnostic of valuation.  2. Obstructive jaundice secondary to pancreas head mass-status post placement of a bile duct stent 03/24/2012  3. Weight loss- improved with Megace and nutrition supplements 4. Cough-A. chest x-ray and CT on 05/07/12 confirmed extensive infiltrate in the right lung, she completed 10 days of antibiotics. There is a persistent cough and abnormal lung exam  Disposition:  She has pancreas cancer and is being managed with a supportive care approach. She is symptomatic with exertional dyspnea, likely related to the infiltrative process in the right lung. The dyspnea persists despite antibiotic therapy. I am concerned the lung findings are related to the pancreas cancer or another malignancy. She will be referred for a repeat chest x-ray today. We will make a pulmonary referral if there is stable or worsened infiltration in the right lung. She may have a central obstructing lesion.  Ms. Dolinski will return for an office visit in one month. She will contact us if she develops increased dyspnea.   Thornton Papas, MD  06/03/2012  4:58 PM

## 2012-06-03 NOTE — Telephone Encounter (Signed)
appts made and printed for pt pt sent to xray   aom

## 2012-06-04 ENCOUNTER — Other Ambulatory Visit: Payer: Self-pay | Admitting: *Deleted

## 2012-06-04 ENCOUNTER — Telehealth: Payer: Self-pay | Admitting: Oncology

## 2012-06-04 ENCOUNTER — Telehealth: Payer: Self-pay | Admitting: Gastroenterology

## 2012-06-04 ENCOUNTER — Telehealth: Payer: Self-pay | Admitting: *Deleted

## 2012-06-04 DIAGNOSIS — C259 Malignant neoplasm of pancreas, unspecified: Secondary | ICD-10-CM

## 2012-06-04 NOTE — Telephone Encounter (Signed)
The pt was asking whether there were polyps on her last colon..  No polyps were found pt aware

## 2012-06-04 NOTE — Telephone Encounter (Signed)
received a call from pts daughter and schedule appt with Dr. Maple Hudson @ [place] for 07/01  @ 2"30pm

## 2012-06-04 NOTE — Telephone Encounter (Signed)
CXR review per Dr. Truett Perna. Needs pulmonary consult to rule out lung mass or obstructive pneumonitis in next week. Send to [facility]. Notified daughter. Needs to call if she develops acute dyspnea or fever. POF to scheduler

## 2012-06-08 ENCOUNTER — Other Ambulatory Visit: Payer: Medicare Other

## 2012-06-08 ENCOUNTER — Ambulatory Visit (INDEPENDENT_AMBULATORY_CARE_PROVIDER_SITE_OTHER): Payer: Medicare Other | Admitting: Internal Medicine

## 2012-06-08 ENCOUNTER — Encounter: Payer: Self-pay | Admitting: Internal Medicine

## 2012-06-08 VITALS — BP 122/68 | HR 80 | Ht 62.0 in | Wt 111.8 lb

## 2012-06-08 DIAGNOSIS — J189 Pneumonia, unspecified organism: Secondary | ICD-10-CM

## 2012-06-08 DIAGNOSIS — Y95 Nosocomial condition: Secondary | ICD-10-CM

## 2012-06-08 DIAGNOSIS — J13 Pneumonia due to Streptococcus pneumoniae: Secondary | ICD-10-CM | POA: Diagnosis not present

## 2012-06-08 DIAGNOSIS — J181 Lobar pneumonia, unspecified organism: Secondary | ICD-10-CM

## 2012-06-08 MED ORDER — MOXIFLOXACIN HCL 400 MG PO TABS: 400.0000 mg | ORAL_TABLET | Freq: Every day | ORAL | Status: AC

## 2012-06-08 NOTE — Patient Instructions (Addendum)
Script for Avelox sent   Try mucinex 600 mg twice daily to this mucus  Order- lab- CBC w/ diff,    Dx pneumonia NOS             Sputum for routine C&S, and for AFB smear and culture

## 2012-06-08 NOTE — Progress Notes (Signed)
06/08/12- 83 yoF former smoker  referred by Dr Truett Perna. Pt states having Increase SOB upon activity Productive cough in am . PCP Dr [name]    Son here She is being monitored for possible pancreatic tumor and has history of a biliary stent. For about 3 months she has been aware of dyspnea on exertion while walking, relieved by sitting. She had a "mild outpatient pneumonia" treated 2 weeks ago. She had been exercising at the [facility]. She was noting more shortness of breath with exertion after bad cold and some shingles. Weight had been going down. She has been drinking diet supplements to gain weight. She denies cough, wheeze, chest pain. She does hear a rattle but coughs out nothing. Legs feel weak. She denies fever, blood or swollen glands. She denies past history of asthma or pneumonia and she denies heart disease, reflux, choking with meals. She is widowed, living in a retirement center. 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 Father died with pneumonia, mother died with MI. CT chest was concerning for density, more than a "mild pneumonia". CT chest 05/07/12- reviewed with her IMPRESSION:  No evidence of significant pulmonary embolus. The extensive  consolidation of the right lower lung and right middle lung with  patchy areas of nodular infiltration in the left lower lung.  Changes may represent pneumonia. No definite evidence of a central  obstructing lesion although the distal right main stem bronchus is  somewhat narrowed and a cold mass may be obscured by the  consolidation. Recommend follow up after resolution of acute  symptoms. Large esophageal hiatal hernia with mild esophageal  dilatation. Esophagitis versus dysmotility. Biliary wall stent  and biliary gas demonstrated in the upper abdomen.  Original Report Authenticated By: Marlon Pel, M.D.   CXR 06/03/12- IMPRESSION:  1. Multi focal  right lung airspace disease has not significantly  changed since 05/07/2012.  2. Interval increased patchy opacity at the left lung base.  Original Report Authenticated By: Harley Hallmark, M.D.   Prior to Admission medications   Medication Sig Start Date End Date Taking? Authorizing Provider  Calcium Carbonate-Vit D-Min 600-400 MG-UNIT TABS Take 1 tablet by mouth daily.     Yes Historical Provider, MD  esomeprazole (NEXIUM) 40 MG capsule Take 1 capsule (40 mg total) by mouth daily before breakfast. 12/18/11  Yes Judy Pimple, MD  megestrol (MEGACE ORAL) 40 MG/ML suspension Take 5 mLs (200 mg total) by mouth 2 (two) times daily. 05/07/12  Yes Ladene Artist, MD  meloxicam (MOBIC) 7.5 MG tablet Take 7.5 mg by mouth daily as needed. For arthritic pain   Yes Historical Provider, MD  Multiple Vitamin (MULTIVITAMIN) tablet Take 1 tablet by mouth daily.     Yes Historical Provider, MD  Pancrelipase, Lip-Prot-Amyl, 24000 UNITS CPEP 3 pills with every meal and 2 with every snack 05/26/12  Yes Rachael Fee, MD  polyethylene glycol [facility] / GLYCOLAX) packet Take 17 g by mouth daily.   Yes Historical Provider, MD  moxifloxacin (AVELOX) 400 MG tablet Take 1 tablet (400 mg total) by mouth daily. 06/08/12 06/18/12  Waymon Budge, MD   Past Medical History  Diagnosis Date  . Osteoporosis   . Hyperlipidemia   . RLS (restless legs syndrome)   . Vitamin d deficiency   . GERD (gastroesophageal reflux disease)   . Overactive bladder   . Cerumen impaction  recurrent  . Cancer     pancreatic  . Unexplained weight loss   . Cough   . Constipation   . Diarrhea   . Nausea   . Weakness   . Shingles   . Arthritis     OA, in hands   Past Surgical History  Procedure Date  . Cataract extraction     patient unsure of date procedure was done.  . Cataract extraction 11/08    2nd cataract  . Ercp 03/24/2012    Procedure: ENDOSCOPIC RETROGRADE CHOLANGIOPANCREATOGRAPHY (ERCP);  Surgeon: Rachael Fee,  MD;  Location: [facility];  Service: Endoscopy;  Laterality: N/A;  . Eus 04/02/2012    Procedure: ESOPHAGEAL ENDOSCOPIC ULTRASOUND (EUS) RADIAL;  Surgeon: Rachael Fee, MD;  Location: [facility] END[facility];  Service: Endoscopy;  Laterality: N/A;  EUS with FNA  . Fine needle aspiration 04/02/2012    Procedure: FINE NEEDLE ASPIRATION (FNA) LINEAR;  Surgeon: Rachael Fee, MD;  Location: [facility] ENDOS[facility];  Service: Endoscopy;  Laterality: N/A;   Family History  Problem Relation Age of Onset  . Diabetes Sister   . Hypertension Sister   . Heart disease Brother   . Fibromyalgia Daughter   . Alcohol abuse Son   . Heart disease Brother   . Diabetes Brother   . Heart disease Mother   . Pneumonia Father   . Cancer Other     niece had breast and bone cancer  . Heart failure Father    History   Social History  . Marital Status: Widowed    Spouse Name: N/A    Number of Children: 3  . Years of Education: N/A   Occupational History  .      RETIRED    Social History Main Topics  . Smoking status: Former Smoker -- 35 years    Types: Cigarettes    Quit date: 03/23/1992  . Smokeless tobacco: Never Used   Comment: PT SMOKED 2 CIGARETTES A DAY   . Alcohol Use: No  . Drug Use: No  . Sexually Active: No     not applicable   Other Topics Concern  . Not on file   Social History Narrative   Resides at [facility] in [place] (Independent Living)Widow   ROS-see HPI Constitutional:   +  weight loss, night sweats, fevers, chills, fatigue, lassitude. HEENT:   No-  headaches, difficulty swallowing, tooth/dental problems, sore throat,       No-  sneezing, itching, ear ache, nasal congestion, post nasal drip,  CV:  No-   chest pain, orthopnea, PND, swelling in lower extremities, anasarca,dizziness, palpitations Resp: +  shortness of breath with exertion or at rest.              No-   productive cough,  No non-productive cough,  No- coughing up of blood.              No-   change in  color of mucus.  No- wheezing.   Skin: No-   rash or lesions. GI:  No-   heartburn, indigestion, abdominal pain, nausea, vomiting, diarrhea,                 change in bowel habits, loss of appetite GU: No-   dysuria, change in color of urine, no urgency or frequency.  No- flank pain. MS:  No-   joint pain or swelling.  No- decreased range of motion.  No- back pain. Neuro-  nothing unusual Psych:  No- change in mood or affect. No depression or anxiety.  No memory loss.  OBJ- Physical Exam BP 122/68  Pulse 80  Ht 5\' 2"  (1.575 m)  Wt 111 lb 12.8 oz (50.712 kg)  BMI 20.45 kg/m2  SpO2 95%  General- Alert, Oriented, Affect-appropriate, Distress- none acute, elderly woman Skin- rash-none, lesions- none, excoriation- none Lymphadenopathy- none Head- atraumatic            Eyes- Gross vision intact, PERRLA, conjunctivae and secretions clear            Ears- Hearing, canals-normal            Nose- Clear, no-Septal dev, mucus, polyps, erosion, perforation             Throat- Mallampati II , mucosa clear , drainage- none, tonsils- atrophic. Own teeth Neck- flexible , trachea midline, no stridor , thyroid nl, carotid no bruit Chest - symmetrical excursion , unlabored           Heart/CV- RRR/few extra beats , no murmur , no gallop  , no rub, nl s1 s2                           - JVD- none , edema- none, stasis changes- none, varices- none           Lung- +transient rhonchi right base, wheeze- none, cough- none , dullness-none, rub- none           Chest wall-  Abd- tender-no, distended-no, bowel sounds-present, HSM- no Br/ Gen/ Rectal- Not done, not indicated Extrem- cyanosis- none, clubbing, none, atrophy- none, strength- nl Neuro- grossly intact to observation

## 2012-06-10 ENCOUNTER — Other Ambulatory Visit: Payer: Medicare Other

## 2012-06-10 ENCOUNTER — Telehealth: Payer: Self-pay | Admitting: Nutrition

## 2012-06-10 DIAGNOSIS — J189 Pneumonia, unspecified organism: Secondary | ICD-10-CM

## 2012-06-10 NOTE — Telephone Encounter (Signed)
Patient had a question about taking some of her medications with the juice based supplements.  She is drinking 3 of these daily.  She has seen an improvement in her weight from 104.8 pounds to 111.8 pounds on July 1.  She states she is so pleased with her weight gain.  I have instructed her to only take medications that can be taken with food with the juice based supplements.  She is to contact her doctor or pharmacist for further questions on medications.  Patient was appreciative of nutrition information.

## 2012-06-11 DIAGNOSIS — J181 Lobar pneumonia, unspecified organism: Secondary | ICD-10-CM | POA: Insufficient documentation

## 2012-06-11 NOTE — Assessment & Plan Note (Addendum)
I reviewed the images with the family and the patient. The parenchymal density does look like pneumonia or other than obstruction with atelectasis. Given her large hiatal hernia, this could be a chronic organizing pneumonia(COP/BOOP), secondary to an aspiration pneumonia. She has had conscious sedation for GI procedures which might have given opportunity for an unrecognized aspiration event. This is speculation.  Plan-lab for CBC with differential and sputum cultures for routine and AFB. Trial of therapy with avelox. She is not strong. We outlined the possibility of bronchoscopy would much prefer to give conservative therapy a chance.

## 2012-06-12 ENCOUNTER — Telehealth: Payer: Self-pay | Admitting: Internal Medicine

## 2012-06-12 NOTE — Telephone Encounter (Signed)
The xray looks more like a pneumonia than anything else, but we need to see how it behaves with antibiotics. Cultures have been recycled by the laboratory, trying to get something to grow. So far that's all I know.

## 2012-06-12 NOTE — Telephone Encounter (Signed)
Spoke with daughter of patient-aware of recs from CY.

## 2012-06-12 NOTE — Telephone Encounter (Signed)
Daughter Alona Bene called back to ask that nurse call home # given then after 5:30 call cell [phone]. Hazel Sams

## 2012-06-12 NOTE — Telephone Encounter (Signed)
Called, spoke with pt's daughter, Alona Bene.  She has a few questions for Dr. Maple Hudson as she was not able to make it to pt's appt with him: 1.  Would like results of sputum culture 2.  States pt is "really breathy" when talking on the phone or doing any activities like getting dressed and showering.  Would like to know if this should get better or if if will get worse.  And, if there is anything else to do to help pt with this.  States this is not any worse since seen by Dr. Maple Hudson. 3.  Would like to know if this could be cancer or if it's only an infection.     Dr. Maple Hudson, pls advise.  Thank you.

## 2012-06-13 LAB — RESPIRATORY CULTURE OR RESPIRATORY AND SPUTUM CULTURE: Gram Stain: NONE SEEN

## 2012-07-03 ENCOUNTER — Telehealth: Payer: Self-pay | Admitting: Internal Medicine

## 2012-07-03 NOTE — Telephone Encounter (Signed)
I spoke with the pt daughter and she is requesting sputum culture results. I advised that AFB results have not been finalized yet, but other results may be available. Pt daughter wants to know what results are so far because the pt is still having SOB with activity and productive cough that is no worse since OV with CY, but is no better either. Pt daughter was requesting to have a sooner appt then 07-27-12 with Dr. Maple Hudson, but wanted to know if anything would be done since all the results are not back yet. Please advise on results and if you think it would be beneficial for the pt to come in for OV or other recs. Thanks. Carron Curie, CMA Allergies  Allergen Reactions  . Hydrocodone Other (See Comments)    Patient unsure of reaction, but it may have been diarrhea.  . Nsaids Other (See Comments)    GI upset  . Penicillins Rash    On scalp

## 2012-07-06 ENCOUNTER — Ambulatory Visit (HOSPITAL_BASED_OUTPATIENT_CLINIC_OR_DEPARTMENT_OTHER): Payer: Medicare Other | Admitting: Oncology

## 2012-07-06 ENCOUNTER — Institutional Professional Consult (permissible substitution): Payer: Medicare Other | Admitting: Internal Medicine

## 2012-07-06 ENCOUNTER — Telehealth: Payer: Self-pay | Admitting: Oncology

## 2012-07-06 VITALS — BP 114/67 | HR 88 | Temp 97.0°F | Ht 62.0 in | Wt 110.7 lb

## 2012-07-06 DIAGNOSIS — R0609 Other forms of dyspnea: Secondary | ICD-10-CM | POA: Diagnosis not present

## 2012-07-06 DIAGNOSIS — R918 Other nonspecific abnormal finding of lung field: Secondary | ICD-10-CM | POA: Diagnosis not present

## 2012-07-06 DIAGNOSIS — C250 Malignant neoplasm of head of pancreas: Secondary | ICD-10-CM

## 2012-07-06 DIAGNOSIS — R05 Cough: Secondary | ICD-10-CM | POA: Diagnosis not present

## 2012-07-06 DIAGNOSIS — C259 Malignant neoplasm of pancreas, unspecified: Secondary | ICD-10-CM

## 2012-07-06 DIAGNOSIS — R0989 Other specified symptoms and signs involving the circulatory and respiratory systems: Secondary | ICD-10-CM | POA: Diagnosis not present

## 2012-07-06 NOTE — Telephone Encounter (Signed)
Spoke with CY-have patient come in for OV with him to discuss treatment plan. [name](pt's daughter) is aware of appt with CY tomorrow at 245pm. Will have patient here at 230pm to check in.

## 2012-07-06 NOTE — Telephone Encounter (Signed)
Faxed records to [facility].

## 2012-07-06 NOTE — Telephone Encounter (Signed)
appts made and printed for pt ,called dr young and they have l/m for the nurse jennifer to call pts daughter

## 2012-07-06 NOTE — Telephone Encounter (Signed)
Pt's daughter has called back & wants to know if pt is going to be seeing CY tomorrow afternoon.  Stated that Victorino Dike told her that might be an option.   Wants an appt b/c breathing is more labored.  Antionette Fairy

## 2012-07-06 NOTE — Telephone Encounter (Signed)
Pt's daughter Alona Bene has called back & would like a returned call at [phone]. Antionette Fairy

## 2012-07-06 NOTE — Addendum Note (Signed)
Addended by: Wandalee Ferdinand on: 07/06/2012 01:31 PM   Modules accepted: Orders

## 2012-07-06 NOTE — Progress Notes (Signed)
   [facility]    OFFICE PROGRESS NOTE   INTERVAL HISTORY:   She returns as scheduled. She saw Dr. Maple Hudson on 06/08/2012. He placed her on a trial of Avelox. She continues to have dyspnea with minimal exertion. She has a cough in the mornings. No fever.  Ms. Altmann reports an improved appetite. She denies pain. Her leg strength has improved.  Objective:  Vital signs in last 24 hours:  Blood pressure 114/67, pulse 88, temperature 97 F (36.1 C), temperature source Oral, height 5\' 2"  (1.575 m), weight 110 lb 11.2 oz (50.213 kg).    HEENT: Sclera anicteric Resp: Inspiratory rhonchi at the right greater than left lower posterior chest, market dyspnea with ambulation. Cardio: Regular rate and rhythm GI: No hepatomegaly, nontender, no mass Vascular: No leg edema    Medications: I have reviewed the patient's current medications.  Assessment/Plan: 1.Pancreas head mass-status post an ERCP brushing and EUS biopsy that were nondiagnostic , the clinical history is consistent with pancreas cancer. The patient and her family are comfortable with no further diagnostic evaluation.  2. Obstructive jaundice secondary to pancreas head mass-status post placement of a bile duct stent 03/24/2012. 3. Weight loss- improved with Megace and nutrition supplements  4. Cough- chest x-ray and CT on 05/07/12 confirmed extensive infiltrate in the right lung, she completed 10 days of antibiotics. There is a persistent cough and abnormal lung exam . She completed another course of antibiotics under the direction of Dr.Young. She continues to have exertional dyspnea.  Disposition:  She has dyspnea with minimal exertion. She ambulated in the office today and there was obvious dyspnea. She became tachycardic in the oxygen saturation remained at 92%. We will refer her back to Dr. Maple Hudson to consider the need for additional diagnostic evaluation.  We decided to make an [place] hospice referral.  Arrangements will be made for home oxygen therapy. I discussed CPR and ACLS issues with the patient and her family. She will be placed on a no CODE BLUE status.  Ms. Ledonne will return for an office visit in one month.   Thornton Papas, MD  07/06/2012  12:52 PM

## 2012-07-06 NOTE — Telephone Encounter (Addendum)
Pt's daughter called back & would like a returned call this afternoon, please.  Can be reached before 5:00 at home, [phone] or after 5:00 on cell at [phone].    Antionette Fairy

## 2012-07-07 ENCOUNTER — Ambulatory Visit (INDEPENDENT_AMBULATORY_CARE_PROVIDER_SITE_OTHER): Payer: Medicare Other | Admitting: Internal Medicine

## 2012-07-07 ENCOUNTER — Encounter: Payer: Self-pay | Admitting: Internal Medicine

## 2012-07-07 VITALS — BP 120/70 | HR 90 | Ht 62.0 in | Wt 112.0 lb

## 2012-07-07 DIAGNOSIS — J181 Lobar pneumonia, unspecified organism: Secondary | ICD-10-CM

## 2012-07-07 DIAGNOSIS — J13 Pneumonia due to Streptococcus pneumoniae: Secondary | ICD-10-CM

## 2012-07-07 MED ORDER — FLUTTER DEVI: Status: DC

## 2012-07-07 MED ORDER — DOXYCYCLINE HYCLATE 100 MG PO TABS: 100.0000 mg | ORAL_TABLET | Freq: Two times a day (BID) | ORAL | Status: AC

## 2012-07-07 NOTE — Patient Instructions (Addendum)
I will ask Dr Christella Hartigan and Dr Truett Perna if they feel we could have you take some prednisone without disturbing your pancreas too much.  Script sent for antibiotic doxycycline  Script for Flutter device-    Try blowing through this 4 times per set, three sets per day, to loosen mucus   Ok to take Mucinex or Mucinex DM , - about 800 to 120 mg per day  We will see what kind of oxygen set-up the home care company brings.  Try elevating the head of the bed by putting a brick under each head leg.

## 2012-07-07 NOTE — Progress Notes (Signed)
06/08/12- 9 yoF former smoker  referred by Dr Truett Perna. Pt states having Increase SOB upon activity Productive cough in am . PCP Dr [name]    Son here She is being monitored for possible pancreatic tumor and has history of a biliary stent. For about 3 months she has been aware of dyspnea on exertion while walking, relieved by sitting. She had a "mild outpatient pneumonia" treated 2 weeks ago. She had been exercising at the [facility]. She was noting more shortness of breath with exertion after bad cold and some shingles. Weight had been going down. She has been drinking diet supplements to gain weight. She denies cough, wheeze, chest pain. She does hear a rattle but coughs out nothing. Legs feel weak. She denies fever, blood or swollen glands. She denies past history of asthma or pneumonia and she denies heart disease, reflux, choking with meals. She is widowed, living in a retirement center. 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 Father died with pneumonia, mother died with MI. CT chest was concerning for density, more than a "mild pneumonia". CT chest 05/07/12- reviewed with her IMPRESSION:  No evidence of significant pulmonary embolus. The extensive  consolidation of the right lower lung and right middle lung with  patchy areas of nodular infiltration in the left lower lung.  Changes may represent pneumonia. No definite evidence of a central  obstructing lesion although the distal right main stem bronchus is  somewhat narrowed and a cold mass may be obscured by the  consolidation. Recommend follow up after resolution of acute  symptoms. Large esophageal hiatal hernia with mild esophageal  dilatation. Esophagitis versus dysmotility. Biliary wall stent  and biliary gas demonstrated in the upper abdomen.  Original Report Authenticated By: Marlon Pel, M.D.   CXR 06/03/12- IMPRESSION:  1. Multi focal  right lung airspace disease has not significantly  changed since 05/07/2012.  2. Interval increased patchy opacity at the left lung base.  Original Report Authenticated By: Harley Hallmark, M.D.   07/07/12- 12 yoF former smoker followed for pneumonia complicated by large hiatal hernia, pancreatic cancer. Dr Sherrill/ Onc> Hospice. Daughter and son here today. She is now followed by hospice because of pancreatic cancer with biliary stent. Notices dyspnea standing, and cough as she gets out of bed and starts moving. Mostly clear phlegm, occasional scant green. Wakes herself coughing. Avelox did not help. We reviewed the CT scan images again, nothing large hiatal hernia full of food and parenchymal consolidation consistent with pneumonia and especially consistent with aspiration pneumonia. Discussed possibility of organizing pneumonia.  ROS-see HPI Constitutional:   +  weight loss, night sweats, fevers, chills, fatigue, lassitude. HEENT:   No-  headaches, difficulty swallowing, tooth/dental problems, sore throat,       No-  sneezing, itching, ear ache, nasal congestion, post nasal drip,  CV:  No-   chest pain, orthopnea, PND, swelling in lower extremities, anasarca,dizziness, palpitations Resp: +  shortness of breath with exertion or at rest.              +  productive cough,  No non-productive cough,  No- coughing up of blood.              No-   change in color of mucus.  No- wheezing.   Skin: No-   rash or lesions. GI:  No-   heartburn, indigestion, abdominal pain, nausea, vomiting,  GU:  MS:  No-   joint pain or swelling.   Neuro-     nothing unusual Psych:  No- change in mood or affect. No depression or anxiety.  No memory loss.  OBJ- Physical Exam BP 120/70  Pulse 90  Ht 5\' 2"  (1.575 m)  Wt 112 lb (50.803 kg)  BMI 20.49 kg/m2  SpO2 94%   General- Alert, Oriented, Affect-appropriate, Distress- none acute, thin elderly woman Skin- rash-none, lesions- none, excoriation-  none Lymphadenopathy- none Head- atraumatic            Eyes- Gross vision intact, PERRLA, conjunctivae and secretions clear            Ears- Hearing, canals-normal            Nose- Clear, no-Septal dev, mucus, polyps, erosion, perforation             Throat- Mallampati II , mucosa clear , drainage- none, tonsils- atrophic. Own teeth Neck- flexible , trachea midline, no stridor , thyroid nl, carotid no bruit Chest - symmetrical excursion , unlabored           Heart/CV- RRR/few extra beats , no murmur , no gallop  , no rub, nl s1 s2                           - JVD- none , edema- none, stasis changes- none, varices- none           Lung- + L. right base, rub- none           Chest wall-  Abd-  Br/ Gen/ Rectal- Not done, not indicated Extrem- cyanosis- none, clubbing, none, atrophy- none, strength- nl Neuro- grossly intact to observation

## 2012-07-09 DIAGNOSIS — C259 Malignant neoplasm of pancreas, unspecified: Secondary | ICD-10-CM | POA: Diagnosis not present

## 2012-07-09 DIAGNOSIS — R17 Unspecified jaundice: Secondary | ICD-10-CM | POA: Diagnosis not present

## 2012-07-12 NOTE — Assessment & Plan Note (Signed)
Noting a very large hiatal hernia fulfilled, lack of fever and scant sputum, I think this is probably and organizing pneumonia/cryptogenic organizing pneumonia/ BOOP. I would give prednisone if I were not afraid of the risk to her pancreas. We can wait on oxygen at this point. Plan-flutter device for pulmonary toilet, doxycycline, Mucinex.

## 2012-07-17 ENCOUNTER — Telehealth: Payer: Self-pay | Admitting: *Deleted

## 2012-07-17 NOTE — Telephone Encounter (Signed)
Patient's daughter called in and confirmed over the phone date and time for 08-03-2012

## 2012-07-17 NOTE — Telephone Encounter (Signed)
Call from pt's daughter requesting to change office visit appt to later in the day. She will be teaching and unable to bring her mom to AM appts. Request forwarded to schedulers.

## 2012-07-20 ENCOUNTER — Ambulatory Visit: Payer: Medicare Other | Admitting: Internal Medicine

## 2012-07-23 ENCOUNTER — Telehealth: Payer: Self-pay | Admitting: *Deleted

## 2012-07-23 LAB — AFB CULTURE WITH SMEAR (NOT AT ARMC): Acid Fast Smear: NONE SEEN

## 2012-07-23 NOTE — Telephone Encounter (Signed)
Comfort Kit request received,signed by dr. Truett Perna and faxed back to Hospice.

## 2012-07-27 ENCOUNTER — Encounter: Payer: Self-pay | Admitting: Internal Medicine

## 2012-07-27 ENCOUNTER — Ambulatory Visit (INDEPENDENT_AMBULATORY_CARE_PROVIDER_SITE_OTHER)
Admission: RE | Admit: 2012-07-27 | Discharge: 2012-07-27 | Disposition: A | Discharge status: Home / Self Care | Payer: Medicare Other | Source: Ambulatory Visit | Attending: Internal Medicine | Admitting: Internal Medicine

## 2012-07-27 ENCOUNTER — Ambulatory Visit: Payer: Medicare Other | Admitting: Internal Medicine

## 2012-07-27 ENCOUNTER — Telehealth: Payer: Self-pay | Admitting: Internal Medicine

## 2012-07-27 ENCOUNTER — Other Ambulatory Visit: Payer: Self-pay | Admitting: Internal Medicine

## 2012-07-27 ENCOUNTER — Ambulatory Visit (INDEPENDENT_AMBULATORY_CARE_PROVIDER_SITE_OTHER): Payer: Medicare Other | Admitting: Internal Medicine

## 2012-07-27 VITALS — BP 110/72 | HR 93 | Ht 61.0 in | Wt 113.6 lb

## 2012-07-27 DIAGNOSIS — J13 Pneumonia due to Streptococcus pneumoniae: Secondary | ICD-10-CM | POA: Diagnosis not present

## 2012-07-27 DIAGNOSIS — C259 Malignant neoplasm of pancreas, unspecified: Secondary | ICD-10-CM | POA: Diagnosis not present

## 2012-07-27 DIAGNOSIS — J189 Pneumonia, unspecified organism: Secondary | ICD-10-CM | POA: Diagnosis not present

## 2012-07-27 DIAGNOSIS — J181 Lobar pneumonia, unspecified organism: Secondary | ICD-10-CM

## 2012-07-27 MED ORDER — PREDNISONE 10 MG PO TABS: ORAL_TABLET | ORAL | Status: DC

## 2012-07-27 NOTE — Telephone Encounter (Signed)
Noted and placed in patients chart. Will sign off as nothing more needed.

## 2012-07-27 NOTE — Patient Instructions (Addendum)
Script sent for prednisone     We will start out taking 4 tabs daily- you can take it all at once, or 2 with breakfast and 2 with supper   Order CXR on arrival next visit    Dx pneumonia

## 2012-07-27 NOTE — Progress Notes (Signed)
06/08/12- 16 yoF former smoker  referred by Dr Truett Perna. Pt states having Increase SOB upon activity Productive cough in am . PCP Dr [name]    Son here She is being monitored for possible pancreatic tumor and has history of a biliary stent. For about 3 months she has been aware of dyspnea on exertion while walking, relieved by sitting. She had a "mild outpatient pneumonia" treated 2 weeks ago. She had been [facility] the [facility]. She was noting more shortness of breath with exertion after bad cold and some shingles. Weight had been going down. She has been drinking diet supplements to gain weight. She denies cough, wheeze, chest pain. She does hear a rattle but coughs out nothing. Legs feel weak. She denies fever, blood or swollen glands. She denies past history of asthma or pneumonia and she denies heart disease, reflux, choking with meals. She is widowed, living in a retirement center. 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 Father died with pneumonia, mother died with MI. CT chest was concerning for density, more than a "mild pneumonia". CT chest 05/07/12- reviewed with her IMPRESSION:  No evidence of significant pulmonary embolus. The extensive  consolidation of the right lower lung and right middle lung with  patchy areas of nodular infiltration in the left lower lung.  Changes may represent pneumonia. No definite evidence of a central  obstructing lesion although the distal right main stem bronchus is  somewhat narrowed and a cold mass may be obscured by the  consolidation. Recommend follow up after resolution of acute  symptoms. Large esophageal hiatal hernia with mild esophageal  dilatation. Esophagitis versus dysmotility. Biliary wall stent  and biliary gas demonstrated in the upper abdomen.  Original Report Authenticated By: Marlon Pel, M.D.   CXR 06/03/12- IMPRESSION:  1. Multi focal  right lung airspace disease has not significantly  changed since 05/07/2012.  2. Interval increased patchy opacity at the left lung base.  Original Report Authenticated By: Harley Hallmark, M.D.   07/07/12- 84 yoF former smoker followed for pneumonia complicated by large hiatal hernia, pancreatic cancer. Dr Sherrill/ Onc> Hospice. Daughter and son here today. She is now followed by hospice because of pancreatic cancer with biliary stent. Notices dyspnea standing, and cough as she gets out of bed and starts moving. Mostly clear phlegm, occasional scant green. Wakes herself coughing. Avelox did not help. We reviewed the CT scan images again, nothing large hiatal hernia full of food and parenchymal consolidation consistent with pneumonia and especially consistent with aspiration pneumonia. Discussed possibility of organizing pneumonia.  07/27/12-  10 yoF former smoker followed for pneumonia complicated by large hiatal hernia, pancreatic cancer.  Dtr here. Sputum CX 06/10/12- Nl flora, neg for AFB. Dr Christella Hartigan has indicated steroid trial, aimed at organizing pneumonia, is ok from pancreas standpoint.  Very short of breath after shower. Home oxygen helps, provided by Hospice through? [employer]. No pain, fever, sweat. Scant clear mucus. Flutter device helps shortness of breath after use, but does not generate productive cough. She denies any choking or strangling with meals or during sleep. CXR I 07/27/12- images reviewed with them. IMPRESSION:  1. Worsening asymmetric airspace disease involving right lung  greater than left, suspicious for pneumonia.  2. Stable mild cardiomegaly.  3. Hiatal hernia again noted.  Original Report Authenticated By: Danae Orleans, M.D.   ROS-see HPI Constitutional:   No-  weight loss, night sweats, fevers, chills, fatigue, lassitude. HEENT:   No-  headaches, difficulty swallowing, tooth/dental problems, sore throat,       No-  sneezing, itching, ear ache, nasal congestion,  post nasal drip,  CV:  No-   chest pain, orthopnea, PND, swelling in lower extremities, anasarca,dizziness, palpitations Resp: +  shortness of breath with exertion or at rest.              +  productive cough,  No non-productive cough,  No- coughing up of blood.              No-   change in color of mucus.  No- wheezing.   Skin: No-   rash or lesions. GI:  No-   heartburn, indigestion, abdominal pain, nausea, vomiting,  GU:  MS:  No-   joint pain or swelling.   Neuro-     nothing unusual Psych:  No- change in mood or affect. No depression or anxiety.  No memory loss.  OBJ- Physical Exam General- Alert, Oriented, Affect-appropriate, Distress- none acute, thin elderly woman Skin- rash-none, lesions- none, excoriation- none Lymphadenopathy- none Head- atraumatic            Eyes- Gross vision intact, PERRLA, conjunctivae and secretions clear            Ears- Hearing, canals-normal            Nose- Clear, no-Septal dev, mucus, polyps, erosion, perforation             Throat- Mallampati II , mucosa clear , drainage- none, tonsils- atrophic. Own teeth Neck- flexible , trachea midline, no stridor , thyroid nl, carotid no bruit Chest - symmetrical excursion , unlabored           Heart/CV- RRR/few extra beats , no murmur , no gallop  , no rub, nl s1 s2                           - JVD- none , edema- none, stasis changes- none, varices- none           Lung- + few crackles, rub- none           Chest wall-  Abd-  Br/ Gen/ Rectal- Not done, not indicated Extrem- cyanosis- none, clubbing, none, atrophy- none, strength- nl Neuro- grossly intact to observation

## 2012-08-01 ENCOUNTER — Encounter: Payer: Self-pay | Admitting: Internal Medicine

## 2012-08-01 NOTE — Assessment & Plan Note (Signed)
At age 78 and on hospice for her pancreatic cancer. I discussed conservative, noninterventional therapy with no bronchoscopy. Patient and daughter seemed comfortable with this. We will try a prednisone taper to see if her parenchymal disease behaves like an organizing pneumonia. I thought we might be looking at not infected, inflammatory results of intermittent aspiration because of her very large hiatal hernia. It may not matter whether this diagnosis is correct unless she can respond to conservative therapy. Cultures are negative so far.

## 2012-08-01 NOTE — Assessment & Plan Note (Signed)
Now hospice status.

## 2012-08-03 ENCOUNTER — Telehealth: Payer: Self-pay | Admitting: Oncology

## 2012-08-03 ENCOUNTER — Ambulatory Visit (HOSPITAL_BASED_OUTPATIENT_CLINIC_OR_DEPARTMENT_OTHER): Payer: Medicare Other | Admitting: Oncology

## 2012-08-03 VITALS — BP 136/86 | HR 74 | Temp 97.0°F | Resp 20 | Ht 61.0 in | Wt 116.8 lb

## 2012-08-03 DIAGNOSIS — C250 Malignant neoplasm of head of pancreas: Secondary | ICD-10-CM | POA: Diagnosis not present

## 2012-08-03 DIAGNOSIS — C259 Malignant neoplasm of pancreas, unspecified: Secondary | ICD-10-CM

## 2012-08-03 NOTE — Progress Notes (Signed)
   [facility]    OFFICE PROGRESS NOTE   INTERVAL HISTORY:   She returns as scheduled. The dyspnea has improved since she was started on prednisone by Dr. Maple Hudson. No pain. The constipation is relieved with MiraLAX.  She is now followed by a hospice nurse. No new complaint. She is using home oxygen.  Objective:  Vital signs in last 24 hours:  Blood pressure 136/86, pulse 74, temperature 97 F (36.1 C), temperature source Oral, resp. rate 20, height 5\' 1"  (1.549 m), weight 116 lb 12.8 oz (52.98 kg).    HEENT: No thrush Resp: Lungs with end inspiratory rhonchi at the right greater than left posterior base, no respiratory distress Cardio: Regular rate and rhythm GI: Nontender, no hepatosplenomegaly, no mass Vascular: No leg edema    Medications: I have reviewed the patient's current medications.  Assessment/Plan: 1.Pancreas head mass-status post an ERCP brushing and EUS biopsy that were nondiagnostic , the clinical history is consistent with pancreas cancer. The patient and her family are comfortable with no further diagnostic evaluation.  2. Obstructive jaundice secondary to pancreas head mass-status post placement of a bile duct stent 03/24/2012.  3. Weight loss- improved with Megace and nutrition supplements , no longer taking Megace 4. Cough- chest x-ray and CT on 05/07/12 confirmed extensive infiltrate in the right lung, she completed 10 days of antibiotics. There is a persistent cough and abnormal lung exam . She completed another course of antibiotics under the direction of Dr.Young. She is now maintained on home oxygen and prednisone. The dyspnea has improved. Dr. Maple Hudson is managing this as an organizing pneumonia versus inflammatory change related to aspiration.    Disposition:  Her overall performance status has improved. She will continue oxygen and prednisone under the direction of Dr. Maple Hudson. She will return for an office visit in one month.   Thornton Papas, MD  08/03/2012  11:15 AM

## 2012-08-03 NOTE — Patient Instructions (Signed)
Bridget Walker [birth year] [id number]  [facility] Discharge Instructions  Your exam findings, labs and results were discussed with your MD today.  Filed Vitals:   08/03/12 1029  BP: 136/86  Pulse: 74  Temp: 97 F (36.1 C)  Resp: 20    Current Outpatient Prescriptions on File Prior to Visit  Medication Sig Dispense Refill  . Calcium Carbonate-Vit D-Min 600-400 MG-UNIT TABS Take 1 tablet by mouth daily.        Marland Kitchen esomeprazole (NEXIUM) 40 MG capsule Take 1 capsule (40 mg total) by mouth daily before breakfast.  90 capsule  3  . guaiFENesin (MUCINEX) 600 MG 12 hr tablet Take 800 mg by mouth 2 (two) times daily.       . meloxicam (MOBIC) 7.5 MG tablet Take 7.5 mg by mouth daily as needed. For arthritic pain      . Multiple Vitamin (MULTIVITAMIN) tablet Take 1 tablet by mouth daily.        . Pancrelipase, Lip-Prot-Amyl, 24000 UNITS CPEP 3 pills with every meal and 2 with every snack  250 capsule  3  . polyethylene glycol (MIRALAX / GLYCOLAX) packet Take 17 g by mouth daily.      . predniSONE (DELTASONE) 10 MG tablet 4 tabs daily  60 tablet  1  . Respiratory Therapy Supplies (FLUTTER) DEVI Blow through 4 times per set, three times daily  1 each  0    Please visit scheduling to obtain calendar for future appointments.  Please call the [facility] at [phone] during business hours should you have any further questions or need assistance in obtaining follow-up care. If you have a medical emergency, please dial 911.  Special Instructions:

## 2012-08-03 NOTE — Telephone Encounter (Signed)
Gave pt appt calendar for 09/08/11 MD only, date per pt rqst

## 2012-08-03 NOTE — Progress Notes (Signed)
Pt resides at [facility].

## 2012-08-07 ENCOUNTER — Telehealth: Payer: Self-pay | Admitting: Internal Medicine

## 2012-08-07 MED ORDER — PREDNISONE 10 MG PO TABS: ORAL_TABLET | ORAL | Status: DC

## 2012-08-07 NOTE — Telephone Encounter (Signed)
Called and spoke with latoya from hospice and she stated that the pt has been taking the prednisone 10 mg   4 tabs daily as directed on the rx that was given on 8-16---pt will be out of this rx on Tuesday and latoya wanted to know if pt will need to stay on this dose or do we need to taper her down.  They tried to  Refill the rx since it has 1 refill but was told this was too early and pt was only given #60 on this rx.  CY please advise how you would like the pt to take the prednisone 10 mg.  Thanks  Allergies  Allergen Reactions  . Hydrocodone Other (See Comments)    Patient unsure of reaction, but it may have been diarrhea.  . Nsaids Other (See Comments)    GI upset  . Penicillins Rash    On scalp

## 2012-08-07 NOTE — Telephone Encounter (Signed)
Called and spoke with Bridget Walker and she is aware of CY recs for the pt to take prednisone 10 mg  2 tabs daily and that the rx for this has been sent to the pts pharmacy.  Nothing further is needed.

## 2012-08-07 NOTE — Telephone Encounter (Signed)
Per CY-prednisone 10 mg #60 take 2 daily.

## 2012-08-09 DIAGNOSIS — R17 Unspecified jaundice: Secondary | ICD-10-CM | POA: Diagnosis not present

## 2012-08-09 DIAGNOSIS — C259 Malignant neoplasm of pancreas, unspecified: Secondary | ICD-10-CM | POA: Diagnosis not present

## 2012-08-13 ENCOUNTER — Other Ambulatory Visit: Payer: Self-pay | Admitting: Internal Medicine

## 2012-08-13 ENCOUNTER — Ambulatory Visit (INDEPENDENT_AMBULATORY_CARE_PROVIDER_SITE_OTHER)
Admission: RE | Admit: 2012-08-13 | Discharge: 2012-08-13 | Disposition: A | Discharge status: Home / Self Care | Payer: Medicare Other | Source: Ambulatory Visit | Attending: Internal Medicine | Admitting: Internal Medicine

## 2012-08-13 ENCOUNTER — Encounter: Payer: Self-pay | Admitting: Internal Medicine

## 2012-08-13 ENCOUNTER — Ambulatory Visit (INDEPENDENT_AMBULATORY_CARE_PROVIDER_SITE_OTHER): Payer: Medicare Other | Admitting: Internal Medicine

## 2012-08-13 VITALS — BP 118/76 | HR 74 | Ht 61.0 in | Wt 118.8 lb

## 2012-08-13 DIAGNOSIS — J181 Lobar pneumonia, unspecified organism: Secondary | ICD-10-CM

## 2012-08-13 DIAGNOSIS — Z23 Encounter for immunization: Secondary | ICD-10-CM | POA: Diagnosis not present

## 2012-08-13 DIAGNOSIS — J13 Pneumonia due to Streptococcus pneumoniae: Secondary | ICD-10-CM | POA: Diagnosis not present

## 2012-08-13 DIAGNOSIS — R918 Other nonspecific abnormal finding of lung field: Secondary | ICD-10-CM | POA: Diagnosis not present

## 2012-08-13 NOTE — Progress Notes (Signed)
06/08/12- 20 yoF former smoker  referred by Dr Benay Spice. Pt states having Increase SOB upon activity Productive cough in am . PCP Dr Loura Pardon    Son here She is being monitored for possible pancreatic tumor and has history of a biliary stent. For about 3 months she has been aware of dyspnea on exertion while walking, relieved by sitting. She had a "mild outpatient pneumonia" treated 2 weeks ago. She had been exercising at the [facility]. She was noting more shortness of breath with exertion after bad cold and some shingles. Weight had been going down. She has been drinking diet supplements to gain weight. She denies cough, wheeze, chest pain. She does hear a rattle but coughs out nothing. Legs feel weak. She denies fever, blood or swollen glands. She denies past history of asthma or pneumonia and she denies heart disease, reflux, choking with meals. She is widowed, living in a retirement center. 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 Father died with pneumonia, mother died with MI. CT chest was concerning for density, more than a "mild pneumonia". CT chest 05/07/12- reviewed with her IMPRESSION:  No evidence of significant pulmonary embolus. The extensive  consolidation of the right lower lung and right middle lung with  patchy areas of nodular infiltration in the left lower lung.  Changes may represent pneumonia. No definite evidence of a central  obstructing lesion although the distal right main stem bronchus is  somewhat narrowed and a cold mass may be obscured by the  consolidation. Recommend follow up after resolution of acute  symptoms. Large esophageal hiatal hernia with mild esophageal  dilatation. Esophagitis versus dysmotility. Biliary wall stent  and biliary gas demonstrated in the upper abdomen.  Original Report Authenticated By: Neale Burly, M.D.   CXR 06/03/12- IMPRESSION:  1. Multi focal  right lung airspace disease has not significantly  changed since 05/07/2012.  2. Interval increased patchy opacity at the left lung base.  Original Report Authenticated By: Randall An, M.D.   07/07/12- 56 yoF former smoker followed for pneumonia complicated by large hiatal hernia, pancreatic cancer. Dr Sherrill/ Onc> Hospice. Daughter and son here today. She is now followed by hospice because of pancreatic cancer with biliary stent. Notices dyspnea standing, and cough as she gets out of bed and starts moving. Mostly clear phlegm, occasional scant green. Wakes herself coughing. Avelox did not help. We reviewed the CT scan images again, nothing large hiatal hernia full of food and parenchymal consolidation consistent with pneumonia and especially consistent with aspiration pneumonia. Discussed possibility of organizing pneumonia.  07/27/12-  55 yoF former smoker followed for pneumonia complicated by large hiatal hernia, pancreatic cancer.  Dtr here. Sputum CX 06/10/12- Nl flora, neg for AFB. Dr Ardis Hughs has indicated steroid trial, aimed at organizing pneumonia, is ok from pancreas standpoint.  Very short of breath after shower. Home oxygen helps, provided by Hospice through? [employer]. No pain, fever, sweat. Scant clear mucus. Flutter device helps shortness of breath after use, but does not generate productive cough. She denies any choking or strangling with meals or during sleep. CXR I 07/27/12- images reviewed with them. IMPRESSION:  1. Worsening asymmetric airspace disease involving right lung  greater than left, suspicious for pneumonia.  2. Stable mild cardiomegaly.  3. Hiatal hernia again noted.  Original Report Authenticated By: Marlaine Hind, M.D.   08/13/12- 22 yoF former smoker followed for  pneumonia complicated by large hiatal hernia, pancreatic cancer.  Dtr here.  Pt reports legs no longer hurt, no more coughing in the morning, wearing O2 most of the time--not today. Has continued  maintenance prednisone 20 mg daily and recognizes breathing is better but it gives her some insomnia. Has elevated head of bed. Using oxygen at 2 L only as needed now. Not coughing at all. Wants flu vax CXR 08/13/12- IMPRESSION:  Improved right lower lobe and left lung base infiltrates.  Underlying COPD. No new abnormalities.   ROS-see HPI Constitutional:   No-  weight loss, night sweats, fevers, chills, fatigue, lassitude. HEENT:   No-  headaches, difficulty swallowing, tooth/dental problems, sore throat,       No-  sneezing, itching, ear ache, nasal congestion, post nasal drip,  CV:  No-   chest pain, orthopnea, PND, swelling in lower extremities, anasarca,dizziness, palpitations Resp: Less shortness of breath with exertion or at rest.              No- productive cough,  No non-productive cough,  No- coughing up of blood.              No-   change in color of mucus.  No- wheezing.   Skin: No-   rash or lesions. GI:  No-   heartburn, indigestion, abdominal pain, nausea, vomiting,  GU:  MS:  No-   joint pain or swelling.   Neuro-     nothing unusual Psych:  No- change in mood or affect. No depression or anxiety.  No memory loss.  OBJ- Physical Exam General- Alert, Oriented, Affect-appropriate, Distress- none acute, thin elderly woman Skin- rash-none, lesions- none, excoriation- none Lymphadenopathy- none Head- atraumatic            Eyes- Gross vision intact, PERRLA, conjunctivae and secretions clear            Ears- Hearing, canals-normal            Nose- Clear, no-Septal dev, mucus, polyps, erosion, perforation             Throat- Mallampati II , mucosa clear , drainage- none, tonsils- atrophic. Own teeth Neck- flexible , trachea midline, no stridor , thyroid nl, carotid no bruit Chest - symmetrical excursion , unlabored           Heart/CV- RRR/few extra beats , no murmur , no gallop  , no rub, nl s1 s2                           - JVD- none , edema- none, stasis changes- none,  varices- none           Lung- + basilar crackles, rub- none           Chest wall-  Abd-  Br/ Gen/ Rectal- Not done, not indicated Extrem- cyanosis- none, clubbing, none, atrophy- none, strength- nl Neuro- grossly intact to observation

## 2012-08-13 NOTE — Patient Instructions (Addendum)
Reduce prednisone to 10 mg once daily in the morning  Ok to use the oxygen as you feel you need it.   Please call as needed   Order CXR at next visit   Dx  Lobar pneumonia  Flu  vax

## 2012-08-13 NOTE — Progress Notes (Signed)
Reynaldo Minium, CMA placed order for CXR under [facility] log in .

## 2012-08-21 NOTE — Assessment & Plan Note (Signed)
Chest x-ray today and her symptoms demonstrate significant improvement. This would fit with theory that the infiltrates represented organizing pneumonia. Steroids are given her insomnia and we discussed side effects of long-term use again. Plan-reduce prednisone to 10 mg daily with chest x-ray on return

## 2012-09-07 ENCOUNTER — Ambulatory Visit (HOSPITAL_BASED_OUTPATIENT_CLINIC_OR_DEPARTMENT_OTHER): Payer: Medicare Other | Admitting: Oncology

## 2012-09-07 ENCOUNTER — Telehealth: Payer: Self-pay | Admitting: Oncology

## 2012-09-07 VITALS — BP 116/68 | HR 99 | Temp 97.8°F | Resp 20 | Ht 61.0 in | Wt 123.5 lb

## 2012-09-07 DIAGNOSIS — G4700 Insomnia, unspecified: Secondary | ICD-10-CM | POA: Diagnosis not present

## 2012-09-07 DIAGNOSIS — C250 Malignant neoplasm of head of pancreas: Secondary | ICD-10-CM

## 2012-09-07 DIAGNOSIS — R05 Cough: Secondary | ICD-10-CM | POA: Diagnosis not present

## 2012-09-07 DIAGNOSIS — R918 Other nonspecific abnormal finding of lung field: Secondary | ICD-10-CM | POA: Diagnosis not present

## 2012-09-07 DIAGNOSIS — C259 Malignant neoplasm of pancreas, unspecified: Secondary | ICD-10-CM

## 2012-09-07 NOTE — Progress Notes (Signed)
   [facility]    OFFICE PROGRESS NOTE   INTERVAL HISTORY:   She returns as scheduled. She has enrolled in the hospice program. Her appetite improved on Megace. She was placed on prednisone by Dr. Maple Hudson and there has been further improvement in her appetite and energy level. The dyspnea has improved. No pain or diarrhea.  The prednisone dose was decreased to 10 mg daily by Dr. Maple Hudson. She continues to have insomnia.  Objective:  Vital signs in last 24 hours:  Blood pressure 116/68, pulse 99, temperature 97.8 F (36.6 C), temperature source Oral, resp. rate 20, height 5\' 1"  (1.549 m), weight 123 lb 8 oz (56.019 kg).    HEENT: No thrush Resp: End inspiratory rhonchi at the lower posterior chest on the right greater than left. Good air movement bilaterally. No respiratory distress. Cardio: Regular rate and rhythm GI: Nontender, no hepatomegaly Vascular: No leg edema  X-rays: Chest x-ray in 08/13/2012-improvement in the hazy airspace opacity in the right lower lobe with mild improvement in the coarse reticular and patchy airspace opacity at the left lung base. COPD.   Medications: I have reviewed the patient's current medications.  Assessment/Plan: 1.Pancreas head mass-status post an ERCP brushing and EUS biopsy that were nondiagnostic , the clinical history is consistent with pancreas cancer. The patient and her family are comfortable with no further diagnostic evaluation.  2. Obstructive jaundice secondary to pancreas head mass-status post placement of a bile duct stent 03/24/2012.  3. Weight loss- improved with Megace and prednisone. The Megace will be discontinued. 4. Cough- chest x-ray and CT on 05/07/12 confirmed extensive infiltrate in the right lung, she completed 10 days of antibiotics. There is a persistent cough and abnormal lung exam . She completed another course of antibiotics under the direction of Dr.Young. She is now maintained on home oxygen and  prednisone. The dyspnea has improved. A chest x-ray was improved on 08/13/2012. 5. Insomnia-likely related to prednisone. I reduced the prednisone dose to 5 mg daily.    Disposition:  Her overall performance status has improved while on prednisone. I decreased the prednisone dose due to insomnia. She also appeared somewhat manic on the prednisone today.  She will continue followup with the [facility] hospice program.  She will see Dr. Maple Hudson on 10/02/2012. Ms. Washington will return for an office visit here on 10/29/2012.   Thornton Papas, MD  09/07/2012  4:06 PM

## 2012-09-07 NOTE — Telephone Encounter (Signed)
Gave pt appt for 10/29/12 MD visit only

## 2012-09-08 DIAGNOSIS — C259 Malignant neoplasm of pancreas, unspecified: Secondary | ICD-10-CM | POA: Diagnosis not present

## 2012-09-08 DIAGNOSIS — R17 Unspecified jaundice: Secondary | ICD-10-CM | POA: Diagnosis not present

## 2012-09-14 ENCOUNTER — Telehealth: Payer: Self-pay | Admitting: *Deleted

## 2012-09-14 NOTE — Telephone Encounter (Signed)
Dr. Truett Perna suggested at last visit to decrease Prednisone to 5 mg daily since she was not sleeping well. Since that time, she feels her breathing has become more labored again. Wants to resume 10 mg daily. OK per Dr. Truett Perna. Told her to contact Dr. Maple Hudson if dose back to 10 mg daily does not help.

## 2012-09-15 DIAGNOSIS — C259 Malignant neoplasm of pancreas, unspecified: Secondary | ICD-10-CM | POA: Diagnosis not present

## 2012-09-15 DIAGNOSIS — R17 Unspecified jaundice: Secondary | ICD-10-CM | POA: Diagnosis not present

## 2012-09-23 DIAGNOSIS — R17 Unspecified jaundice: Secondary | ICD-10-CM | POA: Diagnosis not present

## 2012-09-23 DIAGNOSIS — C259 Malignant neoplasm of pancreas, unspecified: Secondary | ICD-10-CM | POA: Diagnosis not present

## 2012-09-29 DIAGNOSIS — C259 Malignant neoplasm of pancreas, unspecified: Secondary | ICD-10-CM | POA: Diagnosis not present

## 2012-09-29 DIAGNOSIS — R17 Unspecified jaundice: Secondary | ICD-10-CM | POA: Diagnosis not present

## 2012-10-02 ENCOUNTER — Ambulatory Visit: Payer: Medicare Other | Admitting: Internal Medicine

## 2012-10-02 ENCOUNTER — Ambulatory Visit (INDEPENDENT_AMBULATORY_CARE_PROVIDER_SITE_OTHER): Payer: Medicare Other | Admitting: Internal Medicine

## 2012-10-02 ENCOUNTER — Ambulatory Visit (INDEPENDENT_AMBULATORY_CARE_PROVIDER_SITE_OTHER)
Admission: RE | Admit: 2012-10-02 | Discharge: 2012-10-02 | Disposition: A | Discharge status: Home / Self Care | Payer: Medicare Other | Source: Ambulatory Visit | Attending: Internal Medicine | Admitting: Internal Medicine

## 2012-10-02 ENCOUNTER — Encounter: Payer: Self-pay | Admitting: Internal Medicine

## 2012-10-02 VITALS — BP 98/70 | HR 88 | Ht 60.5 in | Wt 128.0 lb

## 2012-10-02 DIAGNOSIS — R911 Solitary pulmonary nodule: Secondary | ICD-10-CM

## 2012-10-02 DIAGNOSIS — J181 Lobar pneumonia, unspecified organism: Secondary | ICD-10-CM

## 2012-10-02 DIAGNOSIS — R17 Unspecified jaundice: Secondary | ICD-10-CM | POA: Diagnosis not present

## 2012-10-02 DIAGNOSIS — J13 Pneumonia due to Streptococcus pneumoniae: Secondary | ICD-10-CM

## 2012-10-02 DIAGNOSIS — C259 Malignant neoplasm of pancreas, unspecified: Secondary | ICD-10-CM | POA: Diagnosis not present

## 2012-10-02 DIAGNOSIS — J984 Other disorders of lung: Secondary | ICD-10-CM | POA: Diagnosis not present

## 2012-10-02 NOTE — Patient Instructions (Addendum)
Suggest you continue prednisone, but try to reduce a little- try alternating 10 mg with 5 mg (1/2 tab) every other day.  Please call as needed

## 2012-10-02 NOTE — Progress Notes (Signed)
06/08/12- 20 yoF former smoker  referred by Dr Benay Spice. Pt states having Increase SOB upon activity Productive cough in am . PCP Dr Loura Pardon    Son here She is being monitored for possible pancreatic tumor and has history of a biliary stent. For about 3 months she has been aware of dyspnea on exertion while walking, relieved by sitting. She had a "mild outpatient pneumonia" treated 2 weeks ago. She had been exercising at the [facility]. She was noting more shortness of breath with exertion after bad cold and some shingles. Weight had been going down. She has been drinking diet supplements to gain weight. She denies cough, wheeze, chest pain. She does hear a rattle but coughs out nothing. Legs feel weak. She denies fever, blood or swollen glands. She denies past history of asthma or pneumonia and she denies heart disease, reflux, choking with meals. She is widowed, living in a retirement center. 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 Father died with pneumonia, mother died with MI. CT chest was concerning for density, more than a "mild pneumonia". CT chest 05/07/12- reviewed with her IMPRESSION:  No evidence of significant pulmonary embolus. The extensive  consolidation of the right lower lung and right middle lung with  patchy areas of nodular infiltration in the left lower lung.  Changes may represent pneumonia. No definite evidence of a central  obstructing lesion although the distal right main stem bronchus is  somewhat narrowed and a cold mass may be obscured by the  consolidation. Recommend follow up after resolution of acute  symptoms. Large esophageal hiatal hernia with mild esophageal  dilatation. Esophagitis versus dysmotility. Biliary wall stent  and biliary gas demonstrated in the upper abdomen.  Original Report Authenticated By: Neale Burly, M.D.   CXR 06/03/12- IMPRESSION:  1. Multi focal  right lung airspace disease has not significantly  changed since 05/07/2012.  2. Interval increased patchy opacity at the left lung base.  Original Report Authenticated By: Randall An, M.D.   07/07/12- 56 yoF former smoker followed for pneumonia complicated by large hiatal hernia, pancreatic cancer. Dr Sherrill/ Onc> Hospice. Daughter and son here today. She is now followed by hospice because of pancreatic cancer with biliary stent. Notices dyspnea standing, and cough as she gets out of bed and starts moving. Mostly clear phlegm, occasional scant green. Wakes herself coughing. Avelox did not help. We reviewed the CT scan images again, nothing large hiatal hernia full of food and parenchymal consolidation consistent with pneumonia and especially consistent with aspiration pneumonia. Discussed possibility of organizing pneumonia.  07/27/12-  55 yoF former smoker followed for pneumonia complicated by large hiatal hernia, pancreatic cancer.  Dtr here. Sputum CX 06/10/12- Nl flora, neg for AFB. Dr Ardis Hughs has indicated steroid trial, aimed at organizing pneumonia, is ok from pancreas standpoint.  Very short of breath after shower. Home oxygen helps, provided by Hospice through? [employer]. No pain, fever, sweat. Scant clear mucus. Flutter device helps shortness of breath after use, but does not generate productive cough. She denies any choking or strangling with meals or during sleep. CXR I 07/27/12- images reviewed with them. IMPRESSION:  1. Worsening asymmetric airspace disease involving right lung  greater than left, suspicious for pneumonia.  2. Stable mild cardiomegaly.  3. Hiatal hernia again noted.  Original Report Authenticated By: Marlaine Hind, M.D.   08/13/12- 22 yoF former smoker followed for  pneumonia complicated by large hiatal hernia, pancreatic cancer.  Dtr here.  Pt reports legs no longer hurt, no more coughing in the morning, wearing O2 most of the time--not today. Has continued  maintenance prednisone 20 mg daily and recognizes breathing is better but it gives her some insomnia. Has elevated head of bed. Using oxygen at 2 L only as needed now. Not coughing at all. Wants flu vax CXR 08/13/12- IMPRESSION:  Improved right lower lobe and left lung base infiltrates.  Underlying COPD. No new abnormalities.   10/02/12- 22 yoF former smoker followed for pneumonia complicated by large hiatal hernia, pancreatic cancer.  Dtr here. Follow-Up  - has been feeling very anxious lately. Hospice nurse thinks that she needs something for anxiety. Prednisone seemed to reduce the amount of parenchymal density, consistent with impression that it was organizing pneumonia. She is followed with a hospice nurse. Reducing prednisone to 5 mg daily left her too short of breath, so  she went back to 10 mg daily. Less morning cough. Recent night sweats. Low back pain sometimes radiates to abdomen. CXR 10/02/12- we reviewed the images together IMPRESSION:  Improved airspace disease in the right mid and lower lung zones.  Persistent opacity is noted  Stable spiculated opacity at the left lung base. Underlying  malignancy cannot be excluded.  Original Report Authenticated By: Donavan Burnet, M.D.   ROS-see HPI Constitutional:   No-  weight loss, +night sweats, fevers, chills, fatigue, lassitude. HEENT:   No-  headaches, difficulty swallowing, tooth/dental problems, sore throat,       No-  sneezing, itching, ear ache, nasal congestion, post nasal drip,  CV:  No-   chest pain, orthopnea, PND, swelling in lower extremities, anasarca,dizziness, palpitations Resp: Less shortness of breath with exertion or at rest.              No- productive cough,  No non-productive cough,  No- coughing up of blood.              No-   change in color of mucus.  No- wheezing.  +Rhonchi in bases with no distress Skin: No-   rash or lesions. GI:  No-   heartburn, indigestion, abdominal pain, nausea, vomiting,  GU:  MS:   No-   joint pain or swelling.  +back pain Neuro-     nothing unusual Psych:  No- change in mood or affect. + depression or anxiety.  No memory loss.  OBJ- Physical Exam General- Alert, Oriented, Affect-appropriate, Distress- none acute, thin elderly woman Skin- rash-none, lesions- none, excoriation- none Lymphadenopathy- none Head- atraumatic            Eyes- Gross vision intact, PERRLA, conjunctivae and secretions clear            Ears- Hearing, canals-normal            Nose- Clear, no-Septal dev, mucus, polyps, erosion, perforation             Throat- Mallampati II , mucosa clear , drainage- none, tonsils- atrophic. Own teeth Neck- flexible , trachea midline, no stridor , thyroid nl, carotid no bruit Chest - symmetrical excursion , unlabored           Heart/CV- RRR/few extra beats , no murmur , no gallop  , no rub, nl s1 s2                           - JVD- none , edema-  none, stasis changes- none, varices- none           Lung- + coarse bilateral rhonchi, rub- none           Chest wall-  Abd-  Br/ Gen/ Rectal- Not done, not indicated Extrem- cyanosis- none, clubbing, none, atrophy- none, strength- nl Neuro- grossly intact to observation

## 2012-10-06 DIAGNOSIS — C259 Malignant neoplasm of pancreas, unspecified: Secondary | ICD-10-CM | POA: Diagnosis not present

## 2012-10-06 DIAGNOSIS — R17 Unspecified jaundice: Secondary | ICD-10-CM | POA: Diagnosis not present

## 2012-10-07 ENCOUNTER — Encounter: Payer: Self-pay | Admitting: *Deleted

## 2012-10-09 DIAGNOSIS — C259 Malignant neoplasm of pancreas, unspecified: Secondary | ICD-10-CM | POA: Diagnosis not present

## 2012-10-09 DIAGNOSIS — R17 Unspecified jaundice: Secondary | ICD-10-CM | POA: Diagnosis not present

## 2012-10-14 ENCOUNTER — Encounter: Payer: Self-pay | Admitting: Internal Medicine

## 2012-10-15 DIAGNOSIS — R911 Solitary pulmonary nodule: Secondary | ICD-10-CM | POA: Insufficient documentation

## 2012-10-15 NOTE — Assessment & Plan Note (Signed)
We have agreed to follow and watch this spiculated nodule. She would not be able to tolerate much of an intervention at this time.

## 2012-10-15 NOTE — Assessment & Plan Note (Addendum)
Infiltrates have continued to improve on prednisone. We have discussed prednisone therapy including glucose, bone thinning, compression fractures, hyperglycemia and risk of infection.  Plan-try to reduce prednisone to 10 mg alternating with 5 mg every other day

## 2012-10-26 ENCOUNTER — Telehealth: Payer: Self-pay | Admitting: *Deleted

## 2012-10-26 DIAGNOSIS — C259 Malignant neoplasm of pancreas, unspecified: Secondary | ICD-10-CM

## 2012-10-26 MED ORDER — OXYCODONE HCL 5 MG PO TABS: 5.0000 mg | ORAL_TABLET | ORAL | Status: DC | PRN

## 2012-10-26 NOTE — Telephone Encounter (Signed)
Call from [facility] requesting office visit appointment with MD prior to 10/29/12 to discuss her pain and decline in function.

## 2012-10-26 NOTE — Telephone Encounter (Signed)
Per Dr. Truett Perna: Let's just wait to see how she responds to new pain med. If it does not help, then will try to find appointment to see her prior to 10/29/12

## 2012-10-26 NOTE — Telephone Encounter (Signed)
Notified Bridget Walker of new pain med and MD wish to try this before working on new appointment. She will pick up med and give dose tonight and stay with her tonight to be sure she tolerates this without difficulty.

## 2012-10-26 NOTE — Telephone Encounter (Signed)
Call from Hospice nurse to report increase in her abdominal pain that radiates to her back despite Tramadol 50 mg every 6 hours prn. Family declines Vicodin reporting it causes rash. Patient had not recalled the reaction it caused. OK per Dr. Truett Perna to try OxyIR 5 mg every 4 hours as needed. Called back and let triage  Nurse know via voice mail new orders and to call family to pick up.

## 2012-10-29 ENCOUNTER — Telehealth: Payer: Self-pay | Admitting: *Deleted

## 2012-10-29 ENCOUNTER — Telehealth: Payer: Self-pay | Admitting: Oncology

## 2012-10-29 ENCOUNTER — Encounter: Payer: Self-pay | Admitting: *Deleted

## 2012-10-29 ENCOUNTER — Ambulatory Visit (HOSPITAL_BASED_OUTPATIENT_CLINIC_OR_DEPARTMENT_OTHER): Payer: Medicare Other | Admitting: Oncology

## 2012-10-29 ENCOUNTER — Ambulatory Visit (HOSPITAL_COMMUNITY)
Admission: RE | Admit: 2012-10-29 | Discharge: 2012-10-29 | Disposition: A | Discharge status: Home / Self Care | Source: Ambulatory Visit | Attending: Oncology | Admitting: Oncology

## 2012-10-29 VITALS — BP 153/83 | HR 100 | Temp 97.3°F | Resp 20 | Ht 60.5 in | Wt 126.0 lb

## 2012-10-29 DIAGNOSIS — M545 Low back pain, unspecified: Secondary | ICD-10-CM | POA: Insufficient documentation

## 2012-10-29 DIAGNOSIS — M949 Disorder of cartilage, unspecified: Secondary | ICD-10-CM | POA: Insufficient documentation

## 2012-10-29 DIAGNOSIS — M47817 Spondylosis without myelopathy or radiculopathy, lumbosacral region: Secondary | ICD-10-CM | POA: Diagnosis not present

## 2012-10-29 DIAGNOSIS — C250 Malignant neoplasm of head of pancreas: Secondary | ICD-10-CM

## 2012-10-29 DIAGNOSIS — C259 Malignant neoplasm of pancreas, unspecified: Secondary | ICD-10-CM

## 2012-10-29 DIAGNOSIS — X58XXXA Exposure to other specified factors, initial encounter: Secondary | ICD-10-CM | POA: Insufficient documentation

## 2012-10-29 DIAGNOSIS — M899 Disorder of bone, unspecified: Secondary | ICD-10-CM | POA: Insufficient documentation

## 2012-10-29 DIAGNOSIS — R109 Unspecified abdominal pain: Secondary | ICD-10-CM

## 2012-10-29 DIAGNOSIS — R942 Abnormal results of pulmonary function studies: Secondary | ICD-10-CM

## 2012-10-29 DIAGNOSIS — S22009A Unspecified fracture of unspecified thoracic vertebra, initial encounter for closed fracture: Secondary | ICD-10-CM | POA: Insufficient documentation

## 2012-10-29 DIAGNOSIS — M546 Pain in thoracic spine: Secondary | ICD-10-CM | POA: Insufficient documentation

## 2012-10-29 DIAGNOSIS — M47814 Spondylosis without myelopathy or radiculopathy, thoracic region: Secondary | ICD-10-CM | POA: Diagnosis not present

## 2012-10-29 MED ORDER — OXYCODONE-ACETAMINOPHEN 5-325 MG PO TABS
1.0000 | ORAL_TABLET | Freq: Once | ORAL | Status: AC
  Administered 2012-10-29: 1 via ORAL

## 2012-10-29 MED ORDER — OXYCODONE HCL ER 10 MG PO T12A: 10.0000 mg | EXTENDED_RELEASE_TABLET | Freq: Two times a day (BID) | ORAL | Status: DC

## 2012-10-29 NOTE — Progress Notes (Signed)
   Bridget Walker    OFFICE PROGRESS NOTE   INTERVAL HISTORY:   Bridget Walker returns as scheduled. She developed acute low back pain several days ago while watching her shoes. She reports pain in the low back/flank area bilaterally and at a similar level over the abdomen. The pain is partially relieved with oxycodone. The pain improves when she lies still. Good appetite. No nausea. Constipation. The breathing has improved. She continues low-dose prednisone.  Objective:  Vital signs in last 24 hours:  Blood pressure 153/83, pulse 100, temperature 97.3 F (36.3 C), temperature source Oral, resp. rate 20, height 5' 0.5" (1.537 m), weight 126 lb (57.153 kg), SpO2 92.00%.    Resp: Inspiratory rhonchi at the left posterior base, no respiratory distress Cardio: Regular rate and rhythm GI: No hepatomegaly, no mass, nontender Vascular: No leg edema  Musculoskeletal: Mild tenderness at the low thoracic/upper lumbar spine. No mass. No rash.   Medications: I have reviewed the patient's current medications.  Assessment/Plan: 1.Pancreas head mass-status post an ERCP brushing and EUS biopsy that were nondiagnostic , the clinical history is consistent with pancreas cancer. The patient and her family are comfortable with no further diagnostic evaluation.  2. Obstructive jaundice secondary to pancreas head mass-status post placement of a bile duct stent 03/24/2012.  3. Weight loss- improved with Megace and prednisone. The Megace was discontinued. 4. Cough- chest x-ray and CT on 05/07/12 confirmed extensive infiltrate in the right lung, she completed 10 days of antibiotics. There is a persistent cough and abnormal lung exam . She completed another course of antibiotics under the direction of Dr.Young. She is now maintained on home oxygen and prednisone. The dyspnea has improved. A chest x-ray was improved on 10/03/2012. 5. acute low back/abdominal pain-this may be related to an acute compression  fracture versus pain from the pancreas mass.  Disposition:  She is uncomfortable with low back pain for the past several days. The pain may be related to pancreas cancer versus a compression fracture. She will begin OxyContin at a dose of 10 mg every 12 hours. She will continue oxycodone and tramadol as needed. She will be referred for a plain x-ray of the thoracic or lumbar spine today. If this study is negative we will refer her to Dr. Christella Hartigan for a celiac block.   Thornton Papas, MD  10/29/2012  5:14 PM

## 2012-10-29 NOTE — Telephone Encounter (Signed)
Received fax from office. Needs Oxycontin script faxed to her pharmacy.

## 2012-10-29 NOTE — Telephone Encounter (Signed)
gv and printed appt schedule for pt for Dec....advised pt to go straight to radiology for dg

## 2012-10-30 ENCOUNTER — Telehealth: Payer: Self-pay | Admitting: *Deleted

## 2012-10-30 NOTE — Telephone Encounter (Signed)
Made Jasmine December at [facility] aware of results and order to stop Oxycontin. Faxed copy of report to # [phone]

## 2012-10-30 NOTE — Telephone Encounter (Signed)
Notified daughter of results-not cancer related and should improve with time. This is an acute, short term pain issue. MD suggests she discontinue the Oxycontin and use oxyir and tramadol.

## 2012-10-30 NOTE — Telephone Encounter (Signed)
Message copied by Wandalee Ferdinand on Fri Oct 30, 2012  9:23 AM ------      Message from: Thornton Papas B      Created: Thu Oct 29, 2012  8:15 PM       Please call patient, call daughter, pain is due to an acute T10 compression, likely due to osteoporosis            Pain should get better over time.            D/c oxycontin, can use oxycodone/tramadol prn. Let hospice nurse know.

## 2012-11-08 ENCOUNTER — Ambulatory Visit: Payer: Self-pay | Admitting: Internal Medicine

## 2012-11-08 DIAGNOSIS — C259 Malignant neoplasm of pancreas, unspecified: Secondary | ICD-10-CM | POA: Diagnosis not present

## 2012-11-08 DIAGNOSIS — R17 Unspecified jaundice: Secondary | ICD-10-CM | POA: Diagnosis not present

## 2012-11-16 ENCOUNTER — Ambulatory Visit (HOSPITAL_BASED_OUTPATIENT_CLINIC_OR_DEPARTMENT_OTHER): Payer: Medicare Other | Admitting: Oncology

## 2012-11-16 ENCOUNTER — Telehealth: Payer: Self-pay | Admitting: Oncology

## 2012-11-16 VITALS — BP 116/72 | HR 84 | Temp 98.0°F | Resp 20 | Ht 60.5 in | Wt 118.9 lb

## 2012-11-16 DIAGNOSIS — M8448XA Pathological fracture, other site, initial encounter for fracture: Secondary | ICD-10-CM | POA: Diagnosis not present

## 2012-11-16 DIAGNOSIS — C250 Malignant neoplasm of head of pancreas: Secondary | ICD-10-CM

## 2012-11-16 DIAGNOSIS — C259 Malignant neoplasm of pancreas, unspecified: Secondary | ICD-10-CM

## 2012-11-16 NOTE — Telephone Encounter (Signed)
appts made and printed for pt aom °

## 2012-11-16 NOTE — Progress Notes (Signed)
   [facility]    OFFICE PROGRESS NOTE   INTERVAL HISTORY:   She returns as scheduled. The back pain is much improved. No dyspnea. She is no more using oxygen. She reports anorexia.  Objective:  Vital signs in last 24 hours:  Blood pressure 116/72, pulse 84, temperature 98 F (36.7 C), temperature source Oral, resp. rate 20, height 5' 0.5" (1.537 m), weight 118 lb 14.4 oz (53.933 kg).    HEENT: ? Mild scleral icterus Resp: Scattered and inspiratory rhonchi, no respiratory distress Cardio: Regular rate and rhythm GI: No hepatomegaly, no mass, nontender Vascular: No leg edema    Medications: I have reviewed the patient's current medications.  Assessment/Plan: 1.Pancreas head mass-status post an ERCP brushing and EUS biopsy that were nondiagnostic , the clinical history is consistent with pancreas cancer. The patient and her family are comfortable with no further diagnostic evaluation.  2. Obstructive jaundice secondary to pancreas head mass-status post placement of a bile duct stent 03/24/2012.  3. Weight loss- improved with Megace and prednisone. The Megace was discontinued. She has lost weight over the past few weeks. The plan is to resume Megace. 4. Cough- chest x-ray and CT on 05/07/12 confirmed extensive infiltrate in the right lung, she completed 10 days of antibiotics. There is a persistent cough and abnormal lung exam . She completed another course of antibiotics under the direction of Dr.Young. She is now maintained on home oxygen and prednisone. The dyspnea has improved. A chest x-ray was improved on 10/03/2012.  5. acute low back/abdominal pain in November 2013-a plain x-ray on 10/29/2012 confirmed a new T10 compression fracture   Disposition:  The back pain is much improved consistent with a healing benign compression fracture. She is taking tramadol twice daily for pain. She will continue followup with the [facility] hospice program. Bridget Walker will return  for an office visit in one month.  Thornton Papas, MD  11/16/2012  5:12 PM

## 2012-11-26 DIAGNOSIS — H16109 Unspecified superficial keratitis, unspecified eye: Secondary | ICD-10-CM | POA: Diagnosis not present

## 2012-11-26 DIAGNOSIS — H52209 Unspecified astigmatism, unspecified eye: Secondary | ICD-10-CM | POA: Diagnosis not present

## 2012-11-26 DIAGNOSIS — H04129 Dry eye syndrome of unspecified lacrimal gland: Secondary | ICD-10-CM | POA: Diagnosis not present

## 2012-11-26 DIAGNOSIS — Z961 Presence of intraocular lens: Secondary | ICD-10-CM | POA: Diagnosis not present

## 2012-12-04 ENCOUNTER — Inpatient Hospital Stay: Payer: Self-pay | Admitting: Internal Medicine

## 2012-12-04 LAB — URINALYSIS, COMPLETE
Glucose,UR: NEGATIVE mg/dL (ref 0–75)
Protein: 30
RBC,UR: 2 /HPF (ref 0–5)
Specific Gravity: 1.013 (ref 1.003–1.030)
WBC UR: 47 /HPF (ref 0–5)

## 2012-12-04 LAB — COMPREHENSIVE METABOLIC PANEL
Alkaline Phosphatase: 296 U/L — ABNORMAL HIGH (ref 50–136)
BUN: 14 mg/dL (ref 7–18)
Calcium, Total: 8.1 mg/dL — ABNORMAL LOW (ref 8.5–10.1)
Chloride: 107 mmol/L (ref 98–107)
Co2: 19 mmol/L — ABNORMAL LOW (ref 21–32)
EGFR (African American): >60
EGFR (Non-African Amer.): >60
Glucose: 132 mg/dL — ABNORMAL HIGH (ref 65–99)
SGOT(AST): 68 U/L — ABNORMAL HIGH (ref 15–37)
SGPT (ALT): 100 U/L — ABNORMAL HIGH (ref 12–78)
Total Protein: 6 g/dL — ABNORMAL LOW (ref 6.4–8.2)

## 2012-12-04 LAB — CBC WITH DIFFERENTIAL/PLATELET
Basophil #: 0 10*3/uL (ref 0.0–0.1)
Basophil %: 0.2 %
Eosinophil #: 0 10*3/uL (ref 0.0–0.7)
HGB: 11.9 g/dL — ABNORMAL LOW (ref 12.0–16.0)
Lymphocyte #: 0.2 10*3/uL — ABNORMAL LOW (ref 1.0–3.6)
MCH: 29.7 pg (ref 26.0–34.0)
MCHC: 33.1 g/dL (ref 32.0–36.0)
MCV: 90 fL (ref 80–100)
Monocyte #: 0 x10 3/mm — ABNORMAL LOW (ref 0.2–0.9)
Monocyte %: 1.4 %
Platelet: 174 10*3/uL (ref 150–440)
RBC: 4.01 10*6/uL (ref 3.80–5.20)
RDW: 17.3 % — ABNORMAL HIGH (ref 11.5–14.5)

## 2012-12-04 LAB — PROTIME-INR: INR: 1.3

## 2012-12-04 LAB — LIPASE, BLOOD: Lipase: 73 U/L (ref 73–393)

## 2012-12-04 LAB — TROPONIN I: Troponin-I: 1.4 ng/mL — ABNORMAL HIGH

## 2012-12-05 LAB — COMPREHENSIVE METABOLIC PANEL
Albumin: 2.7 g/dL — ABNORMAL LOW (ref 3.4–5.0)
Alkaline Phosphatase: 208 U/L — ABNORMAL HIGH (ref 50–136)
Anion Gap: 14 (ref 7–16)
Bilirubin,Total: 6.5 mg/dL — ABNORMAL HIGH (ref 0.2–1.0)
Chloride: 109 mmol/L — ABNORMAL HIGH (ref 98–107)
Co2: 18 mmol/L — ABNORMAL LOW (ref 21–32)
Creatinine: 0.83 mg/dL (ref 0.60–1.30)
Glucose: 143 mg/dL — ABNORMAL HIGH (ref 65–99)
Osmolality: 285 (ref 275–301)
Sodium: 141 mmol/L (ref 136–145)

## 2012-12-05 LAB — CBC WITH DIFFERENTIAL/PLATELET
HCT: 30.7 % — ABNORMAL LOW (ref 35.0–47.0)
Lymphocyte #: 0.7 10*3/uL — ABNORMAL LOW (ref 1.0–3.6)
MCH: 29.4 pg (ref 26.0–34.0)
MCHC: 33.1 g/dL (ref 32.0–36.0)
MCV: 89 fL (ref 80–100)
Monocyte %: 2.6 %
Neutrophil #: 15.5 10*3/uL — ABNORMAL HIGH (ref 1.4–6.5)
RDW: 17.2 % — ABNORMAL HIGH (ref 11.5–14.5)

## 2012-12-06 ENCOUNTER — Other Ambulatory Visit: Payer: Self-pay | Admitting: Family Medicine

## 2012-12-06 LAB — COMPREHENSIVE METABOLIC PANEL
Albumin: 2.6 g/dL — ABNORMAL LOW (ref 3.4–5.0)
Alkaline Phosphatase: 192 U/L — ABNORMAL HIGH (ref 50–136)
Anion Gap: 9 (ref 7–16)
BUN: 18 mg/dL (ref 7–18)
Bilirubin,Total: 2.9 mg/dL — ABNORMAL HIGH (ref 0.2–1.0)
Chloride: 112 mmol/L — ABNORMAL HIGH (ref 98–107)
Creatinine: 0.91 mg/dL (ref 0.60–1.30)
EGFR (African American): >60
Glucose: 162 mg/dL — ABNORMAL HIGH (ref 65–99)
Osmolality: 287 (ref 275–301)
Potassium: 3.6 mmol/L (ref 3.5–5.1)
SGOT(AST): 40 U/L — ABNORMAL HIGH (ref 15–37)
SGPT (ALT): 83 U/L — ABNORMAL HIGH (ref 12–78)
Total Protein: 6.2 g/dL — ABNORMAL LOW (ref 6.4–8.2)

## 2012-12-07 DIAGNOSIS — J9610 Chronic respiratory failure, unspecified whether with hypoxia or hypercapnia: Secondary | ICD-10-CM | POA: Diagnosis not present

## 2012-12-07 DIAGNOSIS — Z515 Encounter for palliative care: Secondary | ICD-10-CM | POA: Diagnosis not present

## 2012-12-07 DIAGNOSIS — A419 Sepsis, unspecified organism: Secondary | ICD-10-CM | POA: Diagnosis not present

## 2012-12-07 LAB — BASIC METABOLIC PANEL
Anion Gap: 11 (ref 7–16)
BUN: 25 mg/dL — ABNORMAL HIGH (ref 7–18)
Co2: 20 mmol/L — ABNORMAL LOW (ref 21–32)
Creatinine: 1.15 mg/dL (ref 0.60–1.30)
EGFR (African American): 50 — ABNORMAL LOW
Glucose: 158 mg/dL — ABNORMAL HIGH (ref 65–99)
Potassium: 3 mmol/L — ABNORMAL LOW (ref 3.5–5.1)
Sodium: 140 mmol/L (ref 136–145)

## 2012-12-07 LAB — CBC WITH DIFFERENTIAL/PLATELET
Basophil #: 0 10*3/uL (ref 0.0–0.1)
Eosinophil #: 0 10*3/uL (ref 0.0–0.7)
Eosinophil %: 0 %
Lymphocyte #: 1.4 10*3/uL (ref 1.0–3.6)
Lymphocyte %: 10.9 %
MCH: 29.5 pg (ref 26.0–34.0)
MCV: 89 fL (ref 80–100)
Monocyte #: 0.4 x10 3/mm (ref 0.2–0.9)
Monocyte %: 3.5 %
Neutrophil %: 85.4 %
Platelet: 192 10*3/uL (ref 150–440)
RDW: 18.1 % — ABNORMAL HIGH (ref 11.5–14.5)
WBC: 12.6 10*3/uL — ABNORMAL HIGH (ref 3.6–11.0)

## 2012-12-07 LAB — CULTURE, BLOOD (SINGLE)

## 2012-12-07 NOTE — Telephone Encounter (Signed)
done

## 2012-12-07 NOTE — Telephone Encounter (Signed)
Go ahead and refil for 6 months, thanks

## 2012-12-07 NOTE — Telephone Encounter (Signed)
Ok to refill, no recent appt with you or future appt but has been f/u with oncologist

## 2012-12-08 LAB — CBC WITH DIFFERENTIAL/PLATELET
Basophil #: 0 10*3/uL (ref 0.0–0.1)
Basophil %: 0.3 %
Eosinophil #: 0 10*3/uL (ref 0.0–0.7)
Eosinophil %: 0.3 %
HCT: 36.3 % (ref 35.0–47.0)
Lymphocyte %: 26.7 %
MCHC: 33.5 g/dL (ref 32.0–36.0)
MCV: 89 fL (ref 80–100)
Neutrophil #: 5.9 10*3/uL (ref 1.4–6.5)
Neutrophil %: 64.7 %
Platelet: 185 10*3/uL (ref 150–440)
RBC: 4.09 10*6/uL (ref 3.80–5.20)
RDW: 17.6 % — ABNORMAL HIGH (ref 11.5–14.5)
WBC: 9.1 10*3/uL (ref 3.6–11.0)

## 2012-12-08 LAB — BASIC METABOLIC PANEL
Anion Gap: 9 (ref 7–16)
BUN: 23 mg/dL — ABNORMAL HIGH (ref 7–18)
Chloride: 115 mmol/L — ABNORMAL HIGH (ref 98–107)
Creatinine: 0.85 mg/dL (ref 0.60–1.30)
EGFR (Non-African Amer.): >60
Osmolality: 295 (ref 275–301)
Potassium: 2.9 mmol/L — ABNORMAL LOW (ref 3.5–5.1)
Sodium: 147 mmol/L — ABNORMAL HIGH (ref 136–145)

## 2012-12-08 LAB — POTASSIUM: Potassium: 3.7 mmol/L (ref 3.5–5.1)

## 2012-12-08 LAB — MAGNESIUM: Magnesium: 1.8 mg/dL

## 2012-12-09 DIAGNOSIS — C259 Malignant neoplasm of pancreas, unspecified: Secondary | ICD-10-CM | POA: Diagnosis not present

## 2012-12-09 DIAGNOSIS — R17 Unspecified jaundice: Secondary | ICD-10-CM | POA: Diagnosis not present

## 2012-12-10 DIAGNOSIS — J449 Chronic obstructive pulmonary disease, unspecified: Secondary | ICD-10-CM | POA: Diagnosis not present

## 2012-12-10 DIAGNOSIS — R509 Fever, unspecified: Secondary | ICD-10-CM | POA: Diagnosis not present

## 2012-12-10 DIAGNOSIS — F411 Generalized anxiety disorder: Secondary | ICD-10-CM | POA: Diagnosis not present

## 2012-12-10 DIAGNOSIS — M6281 Muscle weakness (generalized): Secondary | ICD-10-CM | POA: Diagnosis not present

## 2012-12-10 DIAGNOSIS — R6521 Severe sepsis with septic shock: Secondary | ICD-10-CM | POA: Diagnosis not present

## 2012-12-10 DIAGNOSIS — K219 Gastro-esophageal reflux disease without esophagitis: Secondary | ICD-10-CM | POA: Diagnosis not present

## 2012-12-10 DIAGNOSIS — R05 Cough: Secondary | ICD-10-CM | POA: Diagnosis not present

## 2012-12-10 DIAGNOSIS — J9620 Acute and chronic respiratory failure, unspecified whether with hypoxia or hypercapnia: Secondary | ICD-10-CM | POA: Diagnosis not present

## 2012-12-10 DIAGNOSIS — C259 Malignant neoplasm of pancreas, unspecified: Secondary | ICD-10-CM | POA: Diagnosis not present

## 2012-12-10 DIAGNOSIS — R404 Transient alteration of awareness: Secondary | ICD-10-CM | POA: Diagnosis not present

## 2012-12-10 DIAGNOSIS — R17 Unspecified jaundice: Secondary | ICD-10-CM | POA: Diagnosis not present

## 2012-12-10 DIAGNOSIS — A419 Sepsis, unspecified organism: Secondary | ICD-10-CM | POA: Diagnosis not present

## 2012-12-10 DIAGNOSIS — R262 Difficulty in walking, not elsewhere classified: Secondary | ICD-10-CM | POA: Diagnosis not present

## 2012-12-11 ENCOUNTER — Telehealth: Payer: Self-pay

## 2012-12-11 DIAGNOSIS — R404 Transient alteration of awareness: Secondary | ICD-10-CM | POA: Diagnosis not present

## 2012-12-11 DIAGNOSIS — R6521 Severe sepsis with septic shock: Secondary | ICD-10-CM

## 2012-12-11 DIAGNOSIS — F411 Generalized anxiety disorder: Secondary | ICD-10-CM

## 2012-12-11 DIAGNOSIS — J449 Chronic obstructive pulmonary disease, unspecified: Secondary | ICD-10-CM

## 2012-12-11 DIAGNOSIS — R509 Fever, unspecified: Secondary | ICD-10-CM | POA: Diagnosis not present

## 2012-12-11 DIAGNOSIS — J4489 Other specified chronic obstructive pulmonary disease: Secondary | ICD-10-CM

## 2012-12-11 DIAGNOSIS — A419 Sepsis, unspecified organism: Secondary | ICD-10-CM

## 2012-12-11 NOTE — Telephone Encounter (Signed)
Lurena Joiner at [facility] left v/m need to know when pt had flu shot and pneumovax; pt short term rehab pt. Left v/m for Lurena Joiner to call back.

## 2012-12-12 LAB — CULTURE, BLOOD (SINGLE)

## 2012-12-13 LAB — CULTURE, BLOOD (SINGLE)

## 2012-12-14 ENCOUNTER — Telehealth: Payer: Self-pay | Admitting: Oncology

## 2012-12-14 ENCOUNTER — Telehealth: Payer: Self-pay | Admitting: *Deleted

## 2012-12-14 NOTE — Telephone Encounter (Signed)
Bridget Walker at [facility] notified as instructed by telephone that pt had flu vaccine 08/13/12 and Pneumonia vaccine 10/11/02.

## 2012-12-14 NOTE — Telephone Encounter (Signed)
Pt called and wants to r/s appt from 12/15/12 to 01/11/13 nurse notified

## 2012-12-14 NOTE — Telephone Encounter (Signed)
Call from [facility] reporting she was in [facility] for sepsis and has been discharged to [facility] for [facility]. Wants [facility] to obtain records from the admission so he will be aware of what happened. Will have HIM obtain H & P and D/C summary from hospital. Received notification from [facility] that she has been discharged from their service to pursue aggressive treatment at [facility] on skilled days.

## 2012-12-15 ENCOUNTER — Ambulatory Visit: Payer: Medicare Other | Admitting: Oncology

## 2012-12-22 DIAGNOSIS — R05 Cough: Secondary | ICD-10-CM | POA: Diagnosis not present

## 2012-12-25 ENCOUNTER — Telehealth: Payer: Self-pay | Admitting: Oncology

## 2012-12-25 NOTE — Telephone Encounter (Signed)
Returned call to dtr joyce re pt going from [facility] back to her home. Per dtr pt needs hospice ob [place] reinstated and needs an order from [facility]. Message to desk nurse. dtr aware and will call desk nurse or triage if she does not hear back by Monday afternoon.

## 2012-12-28 ENCOUNTER — Telehealth: Payer: Self-pay | Admitting: *Deleted

## 2012-12-28 DIAGNOSIS — C259 Malignant neoplasm of pancreas, unspecified: Secondary | ICD-10-CM

## 2012-12-28 NOTE — Telephone Encounter (Signed)
Referral given to HIM dept to fax records to [facility].

## 2012-12-28 NOTE — Telephone Encounter (Deleted)
Faxed pt medical records to [facility] °

## 2012-12-28 NOTE — Telephone Encounter (Signed)
Message from pt's daughter requesting orders for hospice to be reinstated. Pt being discharged from skilled nursing to assisted living.

## 2012-12-29 ENCOUNTER — Telehealth: Payer: Self-pay | Admitting: Oncology

## 2012-12-29 NOTE — Telephone Encounter (Signed)
Faxed pt medical records to [facility]

## 2013-01-01 ENCOUNTER — Ambulatory Visit: Payer: Medicare Other | Admitting: Internal Medicine

## 2013-01-09 DIAGNOSIS — C259 Malignant neoplasm of pancreas, unspecified: Secondary | ICD-10-CM | POA: Diagnosis not present

## 2013-01-09 DIAGNOSIS — J9620 Acute and chronic respiratory failure, unspecified whether with hypoxia or hypercapnia: Secondary | ICD-10-CM | POA: Diagnosis not present

## 2013-01-09 DIAGNOSIS — J449 Chronic obstructive pulmonary disease, unspecified: Secondary | ICD-10-CM | POA: Diagnosis not present

## 2013-01-11 ENCOUNTER — Telehealth: Payer: Self-pay | Admitting: Oncology

## 2013-01-11 ENCOUNTER — Ambulatory Visit (HOSPITAL_BASED_OUTPATIENT_CLINIC_OR_DEPARTMENT_OTHER): Payer: Medicare Other | Admitting: Oncology

## 2013-01-11 VITALS — BP 110/61 | HR 64 | Temp 97.0°F | Resp 20 | Ht 60.5 in | Wt 111.4 lb

## 2013-01-11 DIAGNOSIS — M545 Low back pain: Secondary | ICD-10-CM

## 2013-01-11 DIAGNOSIS — C250 Malignant neoplasm of head of pancreas: Secondary | ICD-10-CM | POA: Diagnosis not present

## 2013-01-11 DIAGNOSIS — C259 Malignant neoplasm of pancreas, unspecified: Secondary | ICD-10-CM

## 2013-01-11 DIAGNOSIS — K839 Disease of biliary tract, unspecified: Secondary | ICD-10-CM | POA: Diagnosis not present

## 2013-01-11 DIAGNOSIS — M8448XA Pathological fracture, other site, initial encounter for fracture: Secondary | ICD-10-CM | POA: Diagnosis not present

## 2013-01-11 NOTE — Progress Notes (Signed)
    [facility]    OFFICE PROGRESS NOTE   INTERVAL HISTORY:   She returns as scheduled. Her family reports she became ill on Christmas Day with abdominal pain. A few days later she had a high fever [facility] The daughter reports Bridget Walker was diagnosed with a urinary tract infection and "sepsis ". She was in the hospital for several days and this was followed by a skilled nursing stay for the next week. Her performance status has improved and she has returned to the assisted living facility. She is seen by the hospice program daily. Low back pain is relieved with ibuprofen. No dyspnea.  Objective:  Vital signs in last 24 hours:  Blood pressure 110/61, pulse 64, temperature 97 F (36.1 C), temperature source Oral, resp. rate 20, height 5' 0.5" (1.537 m), weight 111 lb 6.4 oz (50.531 kg).    HEENT: ? Mild scleral and subungual icterus. No thrush. Resp: Lungs clear bilaterally Cardio: Regular rate and rhythm with premature beats GI: No hepatomegaly, nontender, no mass Vascular: No leg edema  Skin:? Mild jaundice    Medications: I have reviewed the patient's current medications.  Assessment/Plan: 1.Pancreas head mass-status post an ERCP brushing and EUS biopsy that were nondiagnostic , the clinical history is consistent with pancreas cancer. The patient and her family are comfortable with no further diagnostic evaluation.  2. Obstructive jaundice secondary to pancreas head mass-status post placement of a bile duct stent 03/24/2012.  3. Weight loss- initially improved with Megace and prednisone. Recent weight loss may be related to the admission with sepsis  4. Cough- chest x-ray and CT on 05/07/12 confirmed extensive infiltrate in the right lung, she completed 10 days of antibiotics. There is a persistent cough and abnormal lung exam . She completed another course of antibiotics under the direction of Dr.Young. She is now maintained on home oxygen  and prednisone. The dyspnea has improved. A chest x-ray was improved on 10/03/2012.  5. acute low back/abdominal pain in November 2013-a plain x-ray on 10/29/2012 confirmed a new T10 compression fracture  6. Admission to [facility] in late December of 2013 with "sepsis "  Disposition:  Her overall status appears unchanged today. The back pain may be related to pancreas cancer or the T10 compression fracture. She does not have significant dyspnea at present.  We will followup on records from [facility]. I suspect the admission was related to biliary sepsis or urosepsis. The family will contact us if she develops a recurrent fever/chills and we will arrange for antibiotics/evaluation of the bile duct stent.  She will continue followup with the hospice program. She will return for an office visit in 6 weeks.   Bridget Hunton, MD  01/11/2013  5:22 PM     

## 2013-01-11 NOTE — Telephone Encounter (Signed)
Gave pt appt for MD only March 2014

## 2013-01-13 ENCOUNTER — Ambulatory Visit: Payer: Medicare Other | Admitting: Family Medicine

## 2013-01-14 ENCOUNTER — Ambulatory Visit: Payer: Self-pay | Admitting: Internal Medicine

## 2013-01-18 ENCOUNTER — Telehealth: Payer: Self-pay

## 2013-01-18 NOTE — Telephone Encounter (Signed)
Pt needs to sign a release for records when coming in for labs this week before appt.  Pt says she can not make an appt this week or have labs unless her children can be off work to bring her,  She will call them and call us back tomorrow with a time that is convenient

## 2013-01-18 NOTE — Telephone Encounter (Signed)
Message copied by Donata Duff on Mon Jan 18, 2013  8:59 AM ------      Message from: Rob Bunting P      Created: Mon Jan 18, 2013  8:44 AM       Nida Boatman,      The metal stent is about 58 months old and could be getting plugged up.              Roylene Heaton,      Would like to get copies of any recent bloodwork (probably through [facility]) and any recent imaging tests (also [facility]).  It may be best to have her come to office this week.  Seen by me or extender with set of LFTs just prior so that we can discuss, schedule repeat ERCP if needed.  Thanks            dj            ----- Message -----         From: Ladene Artist, MD         Sent: 01/17/2013   8:45 PM           To: Rachael Fee, MD            She was admitted to [facility] 12/04/12 with enterobacter. Sepsis, increased bili.      I saw her last week and she looks ok.  Now in [facility] hospice program.            Should she get another stent?            Thanks,            Nida Boatman       ------

## 2013-01-19 NOTE — Telephone Encounter (Signed)
Pt says she can not make any appt this week she will call back after discussing with her family if she decides to have the procedure

## 2013-01-19 NOTE — Telephone Encounter (Signed)
Looks like i have time for ERCP this Thursday at [facility], ++MAC.  Can you put her in for that spot and see if she can come in today or tomorrow for appt to discuss the procedure.  Double book with me if needed.

## 2013-01-20 ENCOUNTER — Telehealth: Payer: Self-pay | Admitting: Gastroenterology

## 2013-01-20 ENCOUNTER — Telehealth: Payer: Self-pay | Admitting: *Deleted

## 2013-01-20 DIAGNOSIS — C259 Malignant neoplasm of pancreas, unspecified: Secondary | ICD-10-CM

## 2013-01-20 NOTE — Telephone Encounter (Signed)
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  Do you want to add her on a lunch case next Friday?

## 2013-01-20 NOTE — Telephone Encounter (Signed)
Asking if she needs to have mammogram done every year. [facility] from [facility] that it is due. Made her aware that annual mammography is no longer required. Confirmed w/Dr. Truett Perna.

## 2013-01-24 NOTE — Telephone Encounter (Signed)
Has not had imaging in a long time that I can tell from info sent from [place].  She needs ct scan abd with Iv and po contrast to check for signficant liver burden of tumor vs. Biliary dilation.  Also needs cbc, LFTs.  I am in hosp this coming week.  Would be best if she can have those tests early in the week and then we can plan to proceed with ERCP later in the week (would be ERCP with MAC sedation for biliary stenting.).

## 2013-01-25 NOTE — Telephone Encounter (Signed)
  You have been scheduled for a CT scan of the abdomen and pelvis at [facility] ([address]---this is in the same building as Architectural technologist).   You are scheduled on 01/26/13 at 3 pm  DYE, PLEASE NOTIFY RADIOLOGY IMMEDIATELY AT [phone]! YOU WILL BE GIVEN A 13 HOUR PREMEDICATION PREP.  1) Do not eat or drink anything after 11 am (4 hours prior to your test) 2) You have been given 2 bottles of oral contrast to drink. The solution may taste  better if refrigerated, but do NOT add ice or any other liquid to this solution. Shake  well before drinking.    Drink 1 bottle of contrast @ 1 pm  (2 hours prior to your exam)  Drink 1 bottle of contrast @ 2 pm  (1 hour prior to your exam)  You may take any medications as prescribed with a small amount of water except for the following: Metformin, Glucophage, Glucovance, Avandamet, Riomet, Fortamet, Actoplus Met, Janumet, Glumetza or Metaglip. The above medications must be held the day of the exam AND 48 hours after the exam.  The purpose of you drinking the oral contrast is to aid in the visualization of your intestinal tract. The contrast solution may cause some diarrhea. Before your exam is started, you will be given a small amount of fluid to drink. Depending on your individual set of symptoms, you may also receive an intravenous injection of x-ray contrast/dye. Plan on being at [facility] for 30 minutes or long, depending on the type of exam you are having performed.  If you have any questions regarding your exam or if you need to reschedule, you may call the CT department at [phone] between the hours of 8:00 am and 5:00 pm, Monday-Friday.  ________________________________________________________________________ Pt's daughter  has been given the lab and CT order she will pick up contrast and have labs done tomorrow morning.

## 2013-01-25 NOTE — Telephone Encounter (Signed)
Left message on machine to call back  

## 2013-01-26 ENCOUNTER — Other Ambulatory Visit (INDEPENDENT_AMBULATORY_CARE_PROVIDER_SITE_OTHER): Payer: Medicare Other

## 2013-01-26 ENCOUNTER — Ambulatory Visit (INDEPENDENT_AMBULATORY_CARE_PROVIDER_SITE_OTHER)
Admission: RE | Admit: 2013-01-26 | Discharge: 2013-01-26 | Disposition: A | Discharge status: Home / Self Care | Payer: Medicare Other | Source: Ambulatory Visit | Attending: Gastroenterology | Admitting: Gastroenterology

## 2013-01-26 DIAGNOSIS — K449 Diaphragmatic hernia without obstruction or gangrene: Secondary | ICD-10-CM | POA: Diagnosis not present

## 2013-01-26 DIAGNOSIS — K5730 Diverticulosis of large intestine without perforation or abscess without bleeding: Secondary | ICD-10-CM | POA: Diagnosis not present

## 2013-01-26 DIAGNOSIS — C259 Malignant neoplasm of pancreas, unspecified: Secondary | ICD-10-CM

## 2013-01-26 LAB — CBC WITH DIFFERENTIAL/PLATELET
Basophils Absolute: 0 10*3/uL (ref 0.0–0.1)
Eosinophils Relative: 0.3 % (ref 0.0–5.0)
HCT: 37.1 % (ref 36.0–46.0)
Lymphocytes Relative: 12.8 % (ref 12.0–46.0)
Monocytes Relative: 3.9 % (ref 3.0–12.0)
Platelets: 256 10*3/uL (ref 150.0–400.0)
RDW: 18.7 % — ABNORMAL HIGH (ref 11.5–14.6)
WBC: 8.1 10*3/uL (ref 4.5–10.5)

## 2013-01-26 LAB — BASIC METABOLIC PANEL
BUN: 20 mg/dL (ref 6–23)
CO2: 24 mEq/L (ref 19–32)
Chloride: 110 mEq/L (ref 96–112)
Glucose, Bld: 103 mg/dL — ABNORMAL HIGH (ref 70–99)
Potassium: 3.8 mEq/L (ref 3.5–5.1)

## 2013-01-26 LAB — HEPATIC FUNCTION PANEL
ALT: 80 U/L — ABNORMAL HIGH (ref 0–35)
AST: 35 U/L (ref 0–37)
Alkaline Phosphatase: 140 U/L — ABNORMAL HIGH (ref 39–117)
Bilirubin, Direct: 0.6 mg/dL — ABNORMAL HIGH (ref 0.0–0.3)
Total Bilirubin: 1.3 mg/dL — ABNORMAL HIGH (ref 0.3–1.2)

## 2013-01-26 MED ORDER — IOHEXOL 350 MG/ML SOLN
100.0000 mL | Freq: Once | INTRAVENOUS | Status: AC | PRN
  Administered 2013-01-26: 100 mL via INTRAVENOUS

## 2013-01-27 ENCOUNTER — Other Ambulatory Visit: Payer: Self-pay

## 2013-01-27 ENCOUNTER — Telehealth: Payer: Self-pay

## 2013-01-27 ENCOUNTER — Encounter (HOSPITAL_COMMUNITY): Payer: Self-pay | Admitting: *Deleted

## 2013-01-27 NOTE — Telephone Encounter (Signed)
Pt's daughter has been notified of the procedure and all questions were answered.  Meds and instructions reviewed

## 2013-01-27 NOTE — Telephone Encounter (Signed)
Left message on machine to call back  

## 2013-01-27 NOTE — Pre-Procedure Instructions (Addendum)
Your procedure is scheduled MW:[id number], January 28, 2013 Report to [facility] Admitting [id number] Call this number if you have problems morning of your procedure:[phone]  Follow all bowel prep instructions per your doctor's orders.  Do not eat or drink anything after midnight the night before your procedure. You may brush your teeth, rinse out your mouth, but no water, no food, no chewing gum, no mints, no candies, no chewing tobacco.     Take these medicines the morning of your procedure with A SIP OF WATER:Nexium and Prednisone   Please make arrangements for a responsible person to drive you home after the procedure. You cannot go home by cab/taxi. We recommend you have someone with you at home the first 24 hours after your procedure. Driver for procedure is daughter Cherie Ouch [phone]  LEAVE ALL VALUABLES, JEWELRY, BILLFOLD AT HOME.  NO DENTURES, CONTACT LENSES ALLOWED IN THE ENDOSCOPY ROOM.   YOU MAY WEAR DEODORANT, PLEASE REMOVE ALL JEWELRY, WATCHES RINGS, BODY PIERCINGS AND LEAVE AT HOME.   WOMEN: NO MAKE-UP, LOTIONS PERFUMES

## 2013-01-28 ENCOUNTER — Encounter (HOSPITAL_COMMUNITY): Payer: Self-pay | Admitting: *Deleted

## 2013-01-28 ENCOUNTER — Encounter (HOSPITAL_COMMUNITY)
Admission: RE | Disposition: A | Discharge status: Home / Self Care | Payer: Self-pay | Source: Ambulatory Visit | Attending: Gastroenterology

## 2013-01-28 ENCOUNTER — Observation Stay (HOSPITAL_COMMUNITY)

## 2013-01-28 ENCOUNTER — Encounter (HOSPITAL_COMMUNITY): Payer: Self-pay | Admitting: Anesthesiology

## 2013-01-28 ENCOUNTER — Ambulatory Visit (HOSPITAL_COMMUNITY): Admitting: Anesthesiology

## 2013-01-28 ENCOUNTER — Observation Stay (HOSPITAL_COMMUNITY)
Admission: RE | Admit: 2013-01-28 | Discharge: 2013-01-29 | Disposition: A | Discharge status: Home / Self Care | Source: Ambulatory Visit | Attending: Gastroenterology | Admitting: Gastroenterology

## 2013-01-28 DIAGNOSIS — R634 Abnormal weight loss: Secondary | ICD-10-CM | POA: Insufficient documentation

## 2013-01-28 DIAGNOSIS — K219 Gastro-esophageal reflux disease without esophagitis: Secondary | ICD-10-CM | POA: Insufficient documentation

## 2013-01-28 DIAGNOSIS — T85898A Other specified complication of other internal prosthetic devices, implants and grafts, initial encounter: Secondary | ICD-10-CM | POA: Diagnosis not present

## 2013-01-28 DIAGNOSIS — K869 Disease of pancreas, unspecified: Secondary | ICD-10-CM | POA: Insufficient documentation

## 2013-01-28 DIAGNOSIS — R059 Cough, unspecified: Secondary | ICD-10-CM | POA: Insufficient documentation

## 2013-01-28 DIAGNOSIS — K831 Obstruction of bile duct: Principal | ICD-10-CM | POA: Insufficient documentation

## 2013-01-28 HISTORY — PX: ENDOSCOPIC RETROGRADE CHOLANGIOPANCREATOGRAPHY (ERCP) WITH PROPOFOL: SHX5810

## 2013-01-28 SURGERY — ENDOSCOPIC RETROGRADE CHOLANGIOPANCREATOGRAPHY (ERCP) WITH PROPOFOL
Anesthesia: Monitor Anesthesia Care

## 2013-01-28 MED ORDER — ACETAMINOPHEN 325 MG PO TABS
650.0000 mg | ORAL_TABLET | Freq: Four times a day (QID) | ORAL | Status: DC | PRN
  Administered 2013-01-28: 650 mg via ORAL
  Filled 2013-01-28: qty 2

## 2013-01-28 MED ORDER — BUTAMBEN-TETRACAINE-BENZOCAINE 2-2-14 % EX AERO
INHALATION_SPRAY | CUTANEOUS | Status: DC | PRN
  Administered 2013-01-28: 2 via TOPICAL

## 2013-01-28 MED ORDER — TRAMADOL HCL 50 MG PO TABS
50.0000 mg | ORAL_TABLET | Freq: Once | ORAL | Status: AC
  Administered 2013-01-28: 50 mg via ORAL
  Filled 2013-01-28: qty 1

## 2013-01-28 MED ORDER — ONDANSETRON HCL 4 MG PO TABS
4.0000 mg | ORAL_TABLET | Freq: Four times a day (QID) | ORAL | Status: DC | PRN
  Filled 2013-01-28: qty 1

## 2013-01-28 MED ORDER — PANTOPRAZOLE SODIUM 40 MG PO TBEC
40.0000 mg | DELAYED_RELEASE_TABLET | Freq: Every day | ORAL | Status: DC
  Administered 2013-01-29: 40 mg via ORAL
  Filled 2013-01-28 (×2): qty 1

## 2013-01-28 MED ORDER — ALPRAZOLAM 0.25 MG PO TABS: 0.2500 mg | ORAL_TABLET | Freq: Three times a day (TID) | ORAL | Status: DC | PRN

## 2013-01-28 MED ORDER — ONDANSETRON HCL 4 MG/2ML IJ SOLN: 4.0000 mg | Freq: Four times a day (QID) | INTRAMUSCULAR | Status: DC | PRN

## 2013-01-28 MED ORDER — FENTANYL CITRATE 0.05 MG/ML IJ SOLN
INTRAMUSCULAR | Status: DC | PRN
  Administered 2013-01-28 (×2): 50 ug via INTRAVENOUS

## 2013-01-28 MED ORDER — CIPROFLOXACIN IN D5W 400 MG/200ML IV SOLN
INTRAVENOUS | Status: DC | PRN
  Administered 2013-01-28: 400 mg via INTRAVENOUS

## 2013-01-28 MED ORDER — LACTATED RINGERS IV SOLN
INTRAVENOUS | Status: DC | PRN
  Administered 2013-01-28: 09:00:00 via INTRAVENOUS

## 2013-01-28 MED ORDER — HYDRALAZINE HCL 20 MG/ML IJ SOLN
INTRAMUSCULAR | Status: DC | PRN
  Administered 2013-01-28: 5 mg via INTRAVENOUS

## 2013-01-28 MED ORDER — SODIUM CHLORIDE 0.9 % IJ SOLN: 3.0000 mL | INTRAMUSCULAR | Status: DC | PRN

## 2013-01-28 MED ORDER — ATROPINE SULFATE 0.4 MG/ML IJ SOLN
INTRAMUSCULAR | Status: DC | PRN
  Administered 2013-01-28: 0.2 mg via INTRAVENOUS

## 2013-01-28 MED ORDER — SODIUM CHLORIDE 0.9 % IV SOLN
INTRAVENOUS | Status: DC
  Administered 2013-01-28: 13:00:00 via INTRAVENOUS
  Administered 2013-01-29: 1000 mL via INTRAVENOUS

## 2013-01-28 MED ORDER — CIPROFLOXACIN IN D5W 400 MG/200ML IV SOLN
INTRAVENOUS | Status: AC
  Filled 2013-01-28: qty 200

## 2013-01-28 MED ORDER — PROMETHAZINE HCL 25 MG/ML IJ SOLN: 6.2500 mg | INTRAMUSCULAR | Status: DC | PRN

## 2013-01-28 MED ORDER — PROPOFOL 10 MG/ML IV EMUL
INTRAVENOUS | Status: DC | PRN
  Administered 2013-01-28: 100 ug/kg/min via INTRAVENOUS

## 2013-01-28 MED ORDER — SODIUM CHLORIDE 0.9 % IV SOLN: INTRAVENOUS | Status: DC

## 2013-01-28 MED ORDER — MELOXICAM 7.5 MG PO TABS
7.5000 mg | ORAL_TABLET | Freq: Every day | ORAL | Status: DC | PRN
  Administered 2013-01-28: 7.5 mg via ORAL
  Filled 2013-01-28 (×2): qty 1

## 2013-01-28 MED ORDER — SODIUM CHLORIDE 0.9 % IV SOLN
INTRAVENOUS | Status: DC | PRN
  Administered 2013-01-28: 10:00:00

## 2013-01-28 NOTE — Care Management (Signed)
CARE MANAGEMENT NOTE 01/28/2013  Patient:  Bridget Walker, Bridget Walker   Account Number:  [id number]  Date Initiated:  01/28/2013  Documentation initiated by:  Lane Kjos  Subjective/Objective Assessment:   79 yo female admitted for ERCP.     Action/Plan:   Home when stable   Anticipated DC Date:  01/28/2013   Anticipated DC Plan:           Choice offered to / List presented to:             Status of service:   Medicare Important Message given?   (If response is "NO", the following Medicare IM given date fields will be blank) Date Medicare IM given:   Date Additional Medicare IM given:    Discharge Disposition:    Per UR Regulation:  Reviewed for med. necessity/level of care/duration of stay  If discussed at Long Length of Stay Meetings, dates discussed:    Comments:  01/28/13 1248 Bridget Diffee,RN,BSN [phone]

## 2013-01-28 NOTE — Interval H&P Note (Signed)
History and Physical Interval Note:  1. presumed pancreatic cancer. Presented with painless jaundice, CT scan suggested abnormal area in the head of pancreas though not clearly a mass. Dilated bile duct and intrahepatic duct. ERCP April 2013 found distal bile duct stricture which was brushed and then stented with a 6 cm long uncovered metal stent (tibil max 5, normalized on May 30th lfts). Brushings of the stricture did not show clear cancer. Followup endoscopic ultrasound was incomplete. I could not advance the endoscope passed her duodenal bulb to get good biopsies. I did see a masslike area in the head of pancreas. She has been seen by doctors Truett Perna and byerly. Felt not to be surgical candidate and being followed clinically.     01/28/2013 8:22 AM  Bridget Walker  has presented today for surgery, with the diagnosis of Biliary stent obstruction [996.79]  The various methods of treatment have been discussed with the patient and family. After consideration of risks, benefits and other options for treatment, the patient has consented to  Procedure(s): ENDOSCOPIC RETROGRADE CHOLANGIOPANCREATOGRAPHY (ERCP) WITH PROPOFOL (N/A) as a surgical intervention .  The patient's history has been reviewed, patient examined, no change in status, stable for surgery.  I have reviewed the patient's chart and labs.  Questions were answered to the patient's satisfaction.     Rob Bunting

## 2013-01-28 NOTE — Op Note (Signed)
[facility] [address] [place], [address]   ERCP PROCEDURE REPORT  PATIENT: Bridget Walker, Bridget Walker.  MR# :[id number] BIRTHDATE: May 05, 1925  GENDER: Female ENDOSCOPIST: Rachael Fee, MD PROCEDURE DATE:  01/28/2013 PROCEDURE:   ERCP with stent placement and ERCP with balloon dilation ASA CLASS:   Class III INDICATIONS:was presumed to have pancreatic cancer, s/p ERCP and EUS 03/2012 without clear tissue proof, biliary stricture stented. MEDICATIONS: MAC sedation, administered by CRNA and Cipro 400 mg IV  TOPICAL ANESTHETIC: Cetacaine Spray  DESCRIPTION OF PROCEDURE:   After the risks benefits and alternatives of the procedure were thoroughly explained, informed consent was obtained.  The Pentax ERCP [id number]  endoscope was introduced through the mouth  and advanced to the second portion of the duodenum  without detailed examination of the UGI tract. The previously placed metal biliary stent was barely visible at major papilla, there was clear tissue ingrowth at distal end of the stent.  Using 44 Autotome over a.035 hyradwire the previously placed stent was cannulated and then injected with dye. There was a clear 0.5cm long obstructing stricture about 2cm from major papilla. This appeared to be ingrowth of tissue.  Biliary brushing of the stricture was performed and then the stricture was dilated with 6mm dilating balloon. Finally a new 4cm long, 10mm diameter, fully covered SEMS was placed in good position across the stricture with the distal 5mm of the stent extending into the duodenum across the major papilla.      The scope was then completely withdrawn from the patient and the procedure terminated.     COMPLICATIONS: There were no complications.  ENDOSCOPIC IMPRESSION: AsIngrowth of tissue through previously placed metal biliary stent causing biliary stricture. This strictue was brushed for cytology and then a new, fully covered 4cm SEMS was placed across  the stricture, within the previously placed stent.  RECOMMENDATIONS: Observation overnight to post ERCP complication.   _______________________________ eSignedRachael Fee, MD 01/28/2013 10:25 AM   XB:[id number] Truett Perna, MD

## 2013-01-28 NOTE — Anesthesia Preprocedure Evaluation (Addendum)
Anesthesia Evaluation  Patient identified by MRN, date of birth, ID band Patient awake    Reviewed: Allergy & Precautions, H&P , NPO status , Patient's Chart, lab work & pertinent test results  Airway Mallampati: II TM Distance: >3 FB Neck ROM: Full    Dental no notable dental hx.    Pulmonary neg pulmonary ROS,  breath sounds clear to auscultation  Pulmonary exam normal       Cardiovascular negative cardio ROS  Rhythm:Regular Rate:Normal     Neuro/Psych negative neurological ROS  negative psych ROS   GI/Hepatic Neg liver ROS, GERD-  Medicated,  Endo/Other  negative endocrine ROS  Renal/GU negative Renal ROS  negative genitourinary   Musculoskeletal negative musculoskeletal ROS (+)   Abdominal   Peds negative pediatric ROS (+)  Hematology negative hematology ROS (+)   Anesthesia Other Findings   Reproductive/Obstetrics negative OB ROS                          Anesthesia Physical Anesthesia Plan  ASA: III  Anesthesia Plan: MAC   Post-op Pain Management:    Induction: Intravenous  Airway Management Planned: Simple Face Mask  Additional Equipment:   Intra-op Plan:   Post-operative Plan:   Informed Consent: I have reviewed the patients History and Physical, chart, labs and discussed the procedure including the risks, benefits and alternatives for the proposed anesthesia with the patient or authorized representative who has indicated his/her understanding and acceptance.     Plan Discussed with: CRNA and Surgeon  Anesthesia Plan Comments:         Anesthesia Quick Evaluation

## 2013-01-28 NOTE — H&P (Signed)
[facility] Admission H&P   Primary Care Physician:  [name], MD Primary Gastroenterologist:  Rob Bunting, MD  HPI: Bridget Walker is a 79 y.o. female who in April 2013 presented with painless jaundice in setting of CT scan suggesting abnormal area in the head of pancreas. ERCP April 2013 found distal bile duct stricture which was brushed and then stented with uncovered metal stent . Brushings of the stricture did not show clear cancer. Followup endoscopic ultrasound was incomplete, endoscope could not be advanced pass her duodenal bulb to get good biopsies. A masslike area in the head of pancreas was seen.  Patient was not felt to be surgical candidate so she has been followed for presumed pancreatic cancer.   Patient was in to see Dr. Truett Perna 01/11/13 who determined that patient had been hospitalized at [facility] in December with enterobacter sepsis. She was not seen by GI during that admission. Dr.Sherrill was concerned about biliary source of sepsis and contacted Dr. Christella Hartigan regarding need for ERCP with evaluation of stent. LFTs 12/27/11 revealed minimally elevated bili at 1.3 and alk phos minimally elevated at 140. Patient was schedule for CTscan 01/26/13 which suprisingly showed no pancreatic mass, no definite peripancreatic lymphadenopathy or other suspicious features of malignancy. She was scheduled for ERCP this am which Revealed an ingrowth of tissue through previously placed metal biliary stent causing biliary stricture. This strictue was brushed for cytology and then a new, fully covered SEMS was placed across the stricture, within the previously placed stent   Past Medical History  Diagnosis Date  . Osteoporosis   . Hyperlipidemia   . RLS (restless legs syndrome)   . Vitamin D deficiency   . GERD (gastroesophageal reflux disease)   . Cerumen impaction     recurrent  . Unexplained weight loss   . Cough   . Constipation   . Diarrhea   . Shingles   . Overactive  bladder   . Cancer     pancreatic  . Arthritis     OA, in hands    Past Surgical History  Procedure Laterality Date  . Cataract extraction      patient unsure of date procedure was done.  . Cataract extraction  11/08    2nd cataract  . Ercp  03/24/2012    Procedure: ENDOSCOPIC RETROGRADE CHOLANGIOPANCREATOGRAPHY (ERCP);  Surgeon: Rachael Fee, MD;  Location: [facility];  Service: Endoscopy;  Laterality: N/A;  . Eus  04/02/2012    Procedure: ESOPHAGEAL ENDOSCOPIC ULTRASOUND (EUS) RADIAL;  Surgeon: Rachael Fee, MD;  Location: [facility];  Service: Endoscopy;  Laterality: N/A;  EUS with FNA  . Fine needle aspiration  04/02/2012    Procedure: FINE NEEDLE ASPIRATION (FNA) LINEAR;  Surgeon: Rachael Fee, MD;  Location: [facility];  Service: Endoscopy;  Laterality: N/A;    Prior to Admission medications   Medication Sig Start Date End Date Taking? Authorizing Provider  ALPRAZolam (XANAX) 0.25 MG tablet Take 0.25 mg by mouth every 8 (eight) hours as needed.   Yes Historical Provider, MD  guaiFENesin (MUCINEX) 600 MG 12 hr tablet Take 800 mg by mouth 2 (two) times daily.    Yes Historical Provider, MD  megestrol (MEGACE) 40 MG/ML suspension Take 200 mg by mouth daily. 5 cc twice daily for appetite   Yes Ladene Artist, MD  NEXIUM 40 MG capsule TAKE 1 CAPSULE EVERY DAY BEFORE BREAKFAST 12/06/12  Yes Judy Pimple, MD  Pancrelipase, Lip-Prot-Amyl, 24000 UNITS CPEP 3 pills with  every meal and 2 with every snack 05/26/12  Yes Rachael Fee, MD  polyethylene glycol [facility] / GLYCOLAX) packet Take 17 g by mouth daily as needed.    Yes Historical Provider, MD  predniSONE (DELTASONE) 10 MG tablet Take by mouth daily. Alternates 5 mg with 10 mg   Yes Historical Provider, MD  Respiratory Therapy Supplies (FLUTTER) DEVI Blow through 4 times per set, three times daily 07/07/12 07/07/13 Yes Clinton D Young, MD  traMADol (ULTRAM) 50 MG tablet Take 50 mg by mouth every 6 (six) hours as needed.    Yes Historical Provider, MD  Ibuprofen 200 MG CAPS Take 600 mg by mouth 2 (two) times daily.    Historical Provider, MD  meloxicam (MOBIC) 7.5 MG tablet Take 7.5 mg by mouth daily as needed. For arthritic pain    Historical Provider, MD    Current Facility-Administered Medications  Medication Dose Route Frequency Provider Last Rate Last Dose  . 0.9 %  sodium chloride infusion   Intravenous Continuous Meredith Pel, NP      . ondansetron [facility]) tablet 4 mg  4 mg Oral Q6H PRN Meredith Pel, NP       Or  . ondansetron [facility]) injection 4 mg  4 mg Intravenous Q6H PRN Meredith Pel, NP      . promethazine (PHENERGAN) injection 6.25-12.5 mg  6.25-12.5 mg Intravenous Q15 min PRN Eilene Ghazi, MD        Allergies as of 01/27/2013 - Review Complete 01/26/2013  Allergen Reaction Noted  . Hydrocodone Rash 09/23/2007  . Nsaids Other (See Comments) 09/23/2007  . Penicillins Rash 09/23/2007    Family History  Problem Relation Age of Onset  . Diabetes Sister   . Hypertension Sister   . Heart disease Brother   . Fibromyalgia Daughter   . Alcohol abuse Son   . Heart disease Brother   . Diabetes Brother   . Heart disease Mother   . Pneumonia Father   . Cancer Other     niece had breast and bone cancer  . Heart failure Father     History   Social History  . Marital Status: Widowed    Spouse Name: N/A    Number of Children: 3  . Years of Education: N/A   Occupational History  .      RETIRED    Social History Main Topics  . Smoking status: Former Smoker -- 35 years    Types: Cigarettes    Quit date: 03/23/1992  . Smokeless tobacco: Never Used     Comment: PT SMOKED 2 CIGARETTES A DAY   . Alcohol Use: No  . Drug Use: No  . Sexually Active: No     Comment: not applicable   Other Topics Concern  . Not on file   Social History Narrative   Resides at [facility] in [place] (Independent Living)   Widow    Review of Systems: Not obtained.  Patient still drowsy from sedation / ERCP  Physical Exam: Vital signs in last 24 hours: Temp:  [97.7 F (36.5 C)] 97.7 F (36.5 C) (02/20 0841) Resp:  [18-19] 18 (02/20 1030) BP: (137-147)/(62-90) 137/90 mmHg (02/20 1030) SpO2:  [97 %-100 %] 100 % (02/20 1030) Weight:  [115 lb (52.164 kg)] 115 lb (52.164 kg) (02/20 0841)   General:   Partially alert,  well-developed white female in NAD lying in recovery area Head:  Normocephalic and atraumatic. Eyes:  Closed post-procedure Ears:  Normal auditory acuity. Neck:  Supple; no masses or thyromegaly. Lungs:  Clear throughout to auscultation.   No wheezes, crackles, or rhonchi. No acute distress. Heart:  Regular rate and rhythm. Abdomen:  Soft, nontender and nondistended. No masses, hepatomegaly. No obvious masses.  Normal bowel .    Rectal:  Not done Msk:  Symmetrical without gross deformities. Normal posture. Pulses:  Normal pulses noted. Extremities:  Without edema. Neurologic:  Slightly drowsy post-procedure;  grossly normal neurologically. Skin:  Intact without significant lesions or rashes. Cervical Nodes:  No significant cervical adenopathy. Psych:  Alert and cooperative. Normal mood and affect.  Lab Results:  Recent Labs  01/26/13 1141  WBC 8.1  HGB 12.2  HCT 37.1  PLT 256.0   BMET  Recent Labs  01/26/13 1141  NA 142  K 3.8  CL 110  CO2 24  GLUCOSE 103*  BUN 20  CREATININE 0.7  CALCIUM 9.3   LFT  Recent Labs  01/26/13 1141  PROT 7.1  ALBUMIN 3.4*  AST 35  ALT 80*  ALKPHOS 140*  BILITOT 1.3*  BILIDIR 0.6*    Studies/Results: Ct Abdomen Pelvis W Wo Contrast  01/26/2013  *RADIOLOGY REPORT*  Clinical Data: Pancreatic cancer.  Evaluate the liver.  Back pain.  CT ABDOMEN AND PELVIS WITHOUT AND WITH CONTRAST  Technique:  Multidetector CT imaging of the abdomen and pelvis was performed without contrast material in one or both body regions, followed by contrast material(s) and further sections in one or  both body regions.  Contrast: OMNIPAQUE IOHEXOL 350 MG/ML SOLN  Comparison: CT of the abdomen and pelvis 03/20/2012.  Findings:  Lung Bases: Large hiatal hernia.  Atherosclerosis in the distal right coronary artery.  Pleuroparenchymal thickening in the lung bases bilaterally has an appearance suggestive of developing scarring.  Abdomen/Pelvis:  Compared prior examination there has been interval placement of a common bile duct stent, now with a small amount of pneumobilia predominately in the left lobe of the liver.  Compared to the prior study there is now only minimal intrahepatic biliary ductal dilatation, much improved.  A subcentimeter low attenuation hepatic lesion in segment 2 of the liver is unchanged and although too small to characterize is favored to represent a small cyst.  No other new hepatic lesions are otherwise noted.  Gallbladder is nearly completely contracted but otherwise unremarkable in appearance.  The pancreas appears atrophic.  The previously suspected mass in the region of the uncinate process of the pancreas is no longer clearly visualized. No pancreatic mass is identified on today's examination.  A subcentimeter low attenuation lesion in the anterior aspect of the spleen is unchanged and presumably benign.  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 The appearance of the adrenal glands and the left kidney is unremarkable.  In the anterior aspect of the interpolar region of the right kidney there is a subcentimeter low attenuation lesion which is too small to definitively characterize, but this lesion is unchanged compared to the prior study, and most likely to represent a small cyst.  There is no significant volume of ascites.  No pneumoperitoneum. No definite pathologic distension of small bowel.  Extensive colonic diverticulosis is noted, without definitive surrounding inflammatory changes to  suggest an acute diverticulitis at this time.  Uterus and ovaries are atrophic.  Prominence of the left ovarian and gonadal veins is noted, which can be seen in the setting of pelvic  congestion syndrome.  Urinary bladder is unremarkable in appearance.  Musculoskeletal: 2.5 x 3.3 cm low attenuation fluid collection in the right inguinal region may represent a seroma, and has a benign appearance. There are no aggressive appearing lytic or blastic lesions noted in the visualized portions of the skeleton. Compression fracture of T11 with approximately 8% loss of anterior vertebral body height and 30% loss of posterior vertebral body posterior vertebral body height is noted.  7 mm of anterolisthesis of L5 upon S1.  IMPRESSION: 1. No pancreatic mass identified on today's examination. Specifically, the uncinate process of the pancreas is normal in appearance on today's examination, there is no definite peripancreatic lymphadenopathy or other suspicious features on today's study to suggest malignancy. 2.  Interval placement of common bile duct stent with near complete resolution of the intrahepatic biliary ductal dilatation (now mild). 3.  Colonic diverticulosis without findings to suggest acute diverticulitis. 4.  Large hiatal hernia redemonstrated. 5.  Dilatation of the left ovarian and gonadal veins.  This can be seen in the setting of pelvic congestion syndrome. 6.  Additional incidental findings, as above.   Original Report Authenticated By: Trudie Reed, M.D.     Impression / Plan:   81. 79 year old female with history of presumed pancreatic cancer based on jaundice, CT scan revealing abnormality in pancreas and ERCP finding of distal bile duct stricture. Brushing didn't show clear cancer. EUS was attempted but incomplete. Metal stent was placed at time of ERCP April 2013. Patient has been followed for presumed pancreatic cancer however CTscan yesterday was negative for pancreatic mass. See #2.   2. Recent  enterobacter sepsis. December admission to [facility] for enterobacter sepsis. GI apparently didn't evaluate her during that admission. Patient had ERCP today to rule out stent occlusion as source of sepsis. ERCP revealed a long obstructing stricture which appeared to be ingrowth of tissue. Brushings obtained, stricture dilated, and a fully covered SEMS was placed across stricture within previous stent. Still uncertain regarding whether this is malignancy. She could have cholangiocarcinoma. Patient to be admitted to observation post ERCP.See admission orders.   3. History of anxiety, continue home Xanax   LOS: 0 days   Willette Cluster  01/28/2013, 10:57 AM

## 2013-01-28 NOTE — Transfer of Care (Signed)
Immediate Anesthesia Transfer of Care Note  Patient: Bridget Walker  Procedure(s) Performed: Procedure(s): ENDOSCOPIC RETROGRADE CHOLANGIOPANCREATOGRAPHY (ERCP) WITH PROPOFOL (N/A)  Patient Location: PACU and Endoscopy Unit  Anesthesia Type:MAC  Level of Consciousness: alert , oriented and patient cooperative  Airway & Oxygen Therapy: Patient Spontanous Breathing and Patient connected to face mask oxygen  Post-op Assessment: Report given to PACU RN, Post -op Vital signs reviewed and stable and Patient moving all extremities  Post vital signs: Reviewed and stable  Complications: No apparent anesthesia complications

## 2013-01-28 NOTE — H&P (View-Only) (Signed)
   [facility]    OFFICE PROGRESS NOTE   INTERVAL HISTORY:   She returns as scheduled. Her family reports she became ill on Christmas Day with abdominal pain. A few days later she had a high fever and [facility] The daughter reports Ms. Erker was diagnosed with a urinary tract infection and "sepsis ". She was in the hospital for several days and this was followed by a skilled nursing stay for the next week. Her performance status has improved and she has returned to the assisted living facility. She is seen by the hospice program daily. Low back pain is relieved with ibuprofen. No dyspnea.  Objective:  Vital signs in last 24 hours:  Blood pressure 110/61, pulse 64, temperature 97 F (36.1 C), temperature source Oral, resp. rate 20, height 5' 0.5" (1.537 m), weight 111 lb 6.4 oz (50.531 kg).    HEENT: ? Mild scleral and subungual icterus. No thrush. Resp: Lungs clear bilaterally Cardio: Regular rate and rhythm with premature beats GI: No hepatomegaly, nontender, no mass Vascular: No leg edema  Skin:? Mild jaundice    Medications: I have reviewed the patient's current medications.  Assessment/Plan: 1.Pancreas head mass-status post an ERCP brushing and EUS biopsy that were nondiagnostic , the clinical history is consistent with pancreas cancer. The patient and her family are comfortable with no further diagnostic evaluation.  2. Obstructive jaundice secondary to pancreas head mass-status post placement of a bile duct stent 03/24/2012.  3. Weight loss- initially improved with Megace and prednisone. Recent weight loss may be related to the admission with sepsis  4. Cough- chest x-ray and CT on 05/07/12 confirmed extensive infiltrate in the right lung, she completed 10 days of antibiotics. There is a persistent cough and abnormal lung exam . She completed another course of antibiotics under the direction of Dr.Young. She is now maintained on home oxygen  and prednisone. The dyspnea has improved. A chest x-ray was improved on 10/03/2012.  5. acute low back/abdominal pain in November 2013-a plain x-ray on 10/29/2012 confirmed a new T10 compression fracture  6. Admission to [facility] hospital in late December of 2013 with "sepsis "  Disposition:  Her overall status appears unchanged today. The back pain may be related to pancreas cancer or the T10 compression fracture. She does not have significant dyspnea at present.  We will followup on records from [facility] hospital. I suspect the admission was related to biliary sepsis or urosepsis. The family will contact us if she develops a recurrent fever/chills and we will arrange for antibiotics/evaluation of the bile duct stent.  She will continue followup with the hospice program. She will return for an office visit in 6 weeks.   Thornton Papas, MD  01/11/2013  5:22 PM

## 2013-01-28 NOTE — H&P (Signed)
________________________________________________________________________  [facility] MD note:  I personally examined the patient, reviewed the data and agree with the assessment and plan described above.   Daniel Jacobs, MD [facility] Pager [phone]  

## 2013-01-28 NOTE — Anesthesia Postprocedure Evaluation (Signed)
  Anesthesia Post-op Note  Patient: Bridget Walker  Procedure(s) Performed: Procedure(s) (LRB): ENDOSCOPIC RETROGRADE CHOLANGIOPANCREATOGRAPHY (ERCP) WITH PROPOFOL (N/A)  Patient Location: PACU  Anesthesia Type: MAC  Level of Consciousness: awake and alert   Airway and Oxygen Therapy: Patient Spontanous Breathing  Post-op Pain: mild  Post-op Assessment: Post-op Vital signs reviewed, Patient's Cardiovascular Status Stable, Respiratory Function Stable, Patent Airway and No signs of Nausea or vomiting  Last Vitals:  Filed Vitals:   01/28/13 1030  BP: 137/90  Temp:   Resp: 18    Post-op Vital Signs: stable   Complications: No apparent anesthesia complications

## 2013-01-29 ENCOUNTER — Encounter (HOSPITAL_COMMUNITY): Payer: Self-pay | Admitting: Gastroenterology

## 2013-01-29 LAB — COMPREHENSIVE METABOLIC PANEL
Albumin: 2.8 g/dL — ABNORMAL LOW (ref 3.5–5.2)
Alkaline Phosphatase: 113 U/L (ref 39–117)
BUN: 10 mg/dL (ref 6–23)
Calcium: 8.6 mg/dL (ref 8.4–10.5)
Creatinine, Ser: 0.59 mg/dL (ref 0.50–1.10)
GFR calc Af Amer: >90 mL/min (ref >90)
Glucose, Bld: 93 mg/dL (ref 70–99)
Potassium: 3.2 mEq/L — ABNORMAL LOW (ref 3.5–5.1)
Total Protein: 5.8 g/dL — ABNORMAL LOW (ref 6.0–8.3)

## 2013-01-29 NOTE — Care Management (Signed)
Cm spoke with patient at bedside concerning discharge planning. Pt currently residing at [facility]. Per pt an aide will assist her from facility upon discharge with home care. Pt states having [employer] for home DME use. Per pt adult daughter to provide tx home. No HH services or DME needs stated.   Leonie Green Case Manager [phone]

## 2013-01-29 NOTE — Discharge Summary (Signed)
[facility] Discharge Summary  Name: Bridget Walker MRN: [id number] DOB: 24-May-1925 79 y.o. PCP:  [name], MD  Date of Admission: 01/28/2013  8:07 AM Date of Discharge: 01/29/2013 Primary Gastroenterologist: Rob Bunting, MD Discharging Physician: Rob Bunting, MD  Discharge Diagnosis: 1. Biliary stricture, s/p ERCP with bile duct brushings, balloon dilation and stent placement. Patient diagnosed one year ago with what seemed to be pancreatic cancer. Now abnormal area on CTscan not seen. Given biliary stricture seen on ERCP yesterday, need to rule out cholangiocarcinoma.   2. GERD, stable  Consultations: none this admission   Procedures Performed:  Ct Abdomen Pelvis W Wo Contrast  01/26/2013  *RADIOLOGY REPORT*  Clinical Data: Pancreatic cancer.  Evaluate the liver.  Back pain.  CT ABDOMEN AND PELVIS WITHOUT AND WITH CONTRAST  Technique:  Multidetector CT imaging of the abdomen and pelvis was performed without contrast material in one or both body regions, followed by contrast material(s) and further sections in one or both body regions.  Contrast: OMNIPAQUE IOHEXOL 350 MG/ML SOLN  Comparison: CT of the abdomen and pelvis 03/20/2012.  Findings:  Lung Bases: Large hiatal hernia.  Atherosclerosis in the distal right coronary artery.  Pleuroparenchymal thickening in the lung bases bilaterally has an appearance suggestive of developing scarring.  Abdomen/Pelvis:  Compared prior examination there has been interval placement of a common bile duct stent, now with a small amount of pneumobilia predominately in the left lobe of the liver.  Compared to the prior study there is now only minimal intrahepatic biliary ductal dilatation, much improved.  A subcentimeter low attenuation hepatic lesion in segment 2 of the liver is unchanged and although too small to characterize is favored to represent a small cyst.  No other new hepatic lesions are otherwise noted.  Gallbladder is nearly  completely contracted but otherwise unremarkable in appearance.  The pancreas appears atrophic.  The previously suspected mass in the region of the uncinate process of the pancreas is no longer clearly visualized. No pancreatic mass is identified on today's examination.  A subcentimeter low attenuation lesion in the anterior aspect of the spleen is unchanged and presumably benign.  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 The appearance of the adrenal glands and the left kidney is unremarkable.  In the anterior aspect of the interpolar region of the right kidney there is a subcentimeter low attenuation lesion which is too small to definitively characterize, but this lesion is unchanged compared to the prior study, and most likely to represent a small cyst.  There is no significant volume of ascites.  No pneumoperitoneum. No definite pathologic distension of small bowel.  Extensive colonic diverticulosis is noted, without definitive surrounding inflammatory changes to suggest an acute diverticulitis at this time.  Uterus and ovaries are atrophic.  Prominence of the left ovarian and gonadal veins is noted, which can be seen in the setting of pelvic congestion syndrome.  Urinary bladder is unremarkable in appearance.  Musculoskeletal: 2.5 x 3.3 cm low attenuation fluid collection in the right inguinal region may represent a seroma, and has a benign appearance. There are no aggressive appearing lytic or blastic lesions noted in the visualized portions of the skeleton. Compression fracture of T11 with approximately 8% loss of anterior vertebral body height and 30% loss of posterior vertebral body posterior vertebral body height is noted.  7 mm of anterolisthesis of L5 upon S1.  IMPRESSION: 1. No  pancreatic mass identified on today's examination. Specifically, the uncinate process of the pancreas is normal in appearance on today's  examination, there is no definite peripancreatic lymphadenopathy or other suspicious features on today's study to suggest malignancy. 2.  Interval placement of common bile duct stent with near complete resolution of the intrahepatic biliary ductal dilatation (now mild). 3.  Colonic diverticulosis without findings to suggest acute diverticulitis. 4.  Large hiatal hernia redemonstrated. 5.  Dilatation of the left ovarian and gonadal veins.  This can be seen in the setting of pelvic congestion syndrome. 6.  Additional incidental findings, as above.   Original Report Authenticated By: Trudie Reed, M.D.    Dg Ercp  01/28/2013  *RADIOLOGY REPORT*  Clinical Data: Common bile duct stricture.  ERCP  Comparison:  CT 01/26/2013 and ERCP images from 03/24/2012  Technique:  Multiple spot images obtained with the fluoroscopic device and submitted for interpretation post-procedure.  ERCP was performed by Dr. Christella Hartigan.  Findings: The patient already has a common bile duct metallic stent.  The biliary system was cannulated with a wire.  Contrast injection demonstrated filling defects within the biliary stent. The biliary stent was dilated with a balloon.  A new metallic stent was placed in the mid and distal aspect of the old stent and appears to extend into the duodenum.  IMPRESSION: Filling defects identified in the distal common bile duct stent. This area was treated with balloon dilatation and placement of a new metallic stent.  These images were submitted for radiologic interpretation only. Please see the procedural report for the amount of contrast and the fluoroscopy time utilized.   Original Report Authenticated By: Richarda Overlie, M.D.     GI Procedures:  ERCP with bile duct brushing, balloon dilation and placement of covered stent across stricture, within previously placed stent  History/Physical Exam:  See Admission H&P  Admission HPI: Bridget Walker is a 79 y.o. female who in April 2013 presented with painless  jaundice in setting of CT scan suggesting abnormal area in the head of pancreas. ERCP April 2013 found distal bile duct stricture which was brushed and then stented with uncovered metal stent . Brushings of the stricture did not show clear cancer. Followup endoscopic ultrasound was incomplete, endoscope could not be advanced pass her duodenal bulb to get good biopsies. A masslike area in the head of pancreas was seen. Patient was not felt to be surgical candidate so she has been followed for presumed pancreatic cancer.  Patient was in to see Dr. Truett Perna 01/11/13 who determined that patient had been hospitalized at [facility] in December with enterobacter sepsis. She was not seen by GI during that admission. Dr.Sherrill was concerned about biliary source of sepsis and contacted Dr. Christella Hartigan regarding need for ERCP with evaluation of stent. LFTs 12/27/11 revealed minimally elevated bili at 1.3 and alk phos minimally elevated at 140. Patient was schedule for CTscan 01/26/13 which suprisingly showed no pancreatic mass, no definite peripancreatic lymphadenopathy or other suspicious features of malignancy. She was scheduled for ERCP this am which revealed an ingrowth of tissue through previously placed metal biliary stent causing biliary stricture. This strictue was brushed for cytology and then a new, fully covered SEMS was placed across the stricture, within the previously placed stent   Hospital Course by problem list:  1. Biliary stricture. On the day of admission patient presented as outpatient to endoscopy suite for scheduled ERCP. Findings included an ingrowth of tissue through her previously placed metal biliary stent. Brushings of the  area were obtained, balloon dilation performed and a fully covered 4cm SEMS was placed across the stricture into the existing metal stent. Following the procedure patient was admitted for observational stay. The following morning her LFTs were checked. Her total bilirubin and alkaline  phos had improved to 0.9 and 113 respectively. Patient was discharged home in stable condition. Results of bile duct brushings were still pending at time of discharge.   2. GERD. Maintained on PPI this admission. Discharge Vitals:  BP 156/79  Pulse 77  Temp(Src) 98.5 F (36.9 C) (Oral)  Resp 18  Ht 5\' 2"  (1.575 m)  Wt 115 lb 1.3 oz (52.2 kg)  BMI 21.04 kg/m2  SpO2 94%  Discharge Labs:  Results for orders placed during the hospital encounter of 01/28/13 (from the past 24 hour(s))  COMPREHENSIVE METABOLIC PANEL     Status: Abnormal   Collection Time    01/29/13  3:45 AM      Result Value Range   Sodium 140  135 - 145 mEq/L   Potassium 3.2 (*) 3.5 - 5.1 mEq/L   Chloride 107  96 - 112 mEq/L   CO2 23  19 - 32 mEq/L   Glucose, Bld 93  70 - 99 mg/dL   BUN 10  6 - 23 mg/dL   Creatinine, Ser 1.61  0.50 - 1.10 mg/dL   Calcium 8.6  8.4 - 09.6 mg/dL   Total Protein 5.8 (*) 6.0 - 8.3 g/dL   Albumin 2.8 (*) 3.5 - 5.2 g/dL   AST 29  0 - 37 U/L   ALT 57 (*) 0 - 35 U/L   Alkaline Phosphatase 113  39 - 117 U/L   Total Bilirubin 0.9  0.3 - 1.2 mg/dL   GFR calc non Af Amer 81 (*) >90 mL/min   GFR calc Af Amer >90  >90 mL/min    Disposition and follow-up:   Ms.Sharyah V Bonnette was discharged from [facility] in stable condition.    Follow-up Appointments: Discharge Orders   Future Appointments Provider Department Dept Phone   02/12/2013 3:30 PM Waymon Budge, MD [facility] [phone]   02/25/2013 12:00 PM Ladene Artist, MD [facility] MEDICAL ONCOLOGY [phone]   Future Orders Complete By Expires     Resume previous diet  As directed        Discharge Medications:   Medication List    TAKE these medications       ALPRAZolam 0.25 MG tablet  Commonly known as:  XANAX  Take 0.25 mg by mouth 3 (three) times daily as needed for anxiety.     amylase-lipase-protease 11-12-11 MU per capsule  Commonly known as:  PANCREASE MT 4  Take 3 capsules by  mouth 3 (three) times daily with meals.     esomeprazole 40 MG capsule  Commonly known as:  NEXIUM  Take 40 mg by mouth daily before breakfast.     guaiFENesin 600 MG 12 hr tablet  Commonly known as:  MUCINEX  Take 600 mg by mouth daily with lunch.     ibuprofen 200 MG tablet  Commonly known as:  ADVIL,MOTRIN  Take 400 mg by mouth every 6 (six) hours as needed for pain.     megestrol 40 MG/ML suspension  Commonly known as:  MEGACE  Take 400 mg by mouth daily.     meloxicam 7.5 MG tablet  Commonly known as:  MOBIC  Take 7.5 mg by mouth every morning.  polyethylene glycol packet  Commonly known as:  MIRALAX / GLYCOLAX  Take 17 g by mouth daily as needed (for constipation).     predniSONE 10 MG tablet  Commonly known as:  DELTASONE  Take 10 mg by mouth daily. Take 1 tablet alternating with 1/2 tablet every other day.     traMADol 50 MG tablet  Commonly known as:  ULTRAM  Take 50 mg by mouth 2 (two) times daily.        Signed: Willette Cluster 01/29/2013, 10:59 AM

## 2013-01-29 NOTE — Progress Notes (Signed)
[facility] Progress Note    Since last GI note: ERCP yesterday, full report in chart.  Has mild LUQ pain this AM, improving overnight.  Eating well without n/vomiting  Objective: Vital signs in last 24 hours: Temp:  [98 F (36.7 C)-98.5 F (36.9 C)] 98.5 F (36.9 C) (02/21 0540) Pulse Rate:  [60-77] 77 (02/21 0540) Resp:  [9-25] 18 (02/21 0540) BP: (137-161)/(48-93) 156/79 mmHg (02/21 0540) SpO2:  [91 %-100 %] 94 % (02/21 0540) Weight:  [115 lb 1.3 oz (52.2 kg)] 115 lb 1.3 oz (52.2 kg) (02/20 1226) Last BM Date: 01/27/13 General: alert and oriented times 3 Heart: regular rate and rythm Abdomen: soft, non-tender, non-distended, normal bowel sounds   Lab Results:  Recent Labs  01/26/13 1141  WBC 8.1  HGB 12.2  PLT 256.0  MCV 89.9    Recent Labs  01/26/13 1141 01/29/13 0345  NA 142 140  K 3.8 3.2*  CL 110 107  CO2 24 23  GLUCOSE 103* 93  BUN 20 10  CREATININE 0.7 0.59  CALCIUM 9.3 8.6    Recent Labs  01/26/13 1141 01/29/13 0345  PROT 7.1 5.8*  ALBUMIN 3.4* 2.8*  AST 35 29  ALT 80* 57*  ALKPHOS 140* 113  BILITOT 1.3* 0.9  BILIDIR 0.6*  --    No results found for this basename: INR,  in the last 72 hours   Studies/Results: Dg Ercp  01/28/2013  *RADIOLOGY REPORT*  Clinical Data: Common bile duct stricture.  ERCP  Comparison:  CT 01/26/2013 and ERCP images from 03/24/2012  Technique:  Multiple spot images obtained with the fluoroscopic device and submitted for interpretation post-procedure.  ERCP was performed by Dr. Christella Hartigan.  Findings: The patient already has a common bile duct metallic stent.  The biliary system was cannulated with a wire.  Contrast injection demonstrated filling defects within the biliary stent. The biliary stent was dilated with a balloon.  A new metallic stent was placed in the mid and distal aspect of the old stent and appears to extend into the duodenum.  IMPRESSION: Filling defects identified in the distal common bile  duct stent. This area was treated with balloon dilatation and placement of a new metallic stent.  These images were submitted for radiologic interpretation only. Please see the procedural report for the amount of contrast and the fluoroscopy time utilized.   Original Report Authenticated By: Richarda Overlie, M.D.      Medications: Scheduled Meds: . pantoprazole  40 mg Oral Q0600   Continuous Infusions: . sodium chloride 1,000 mL (01/29/13 0106)   PRN Meds:.acetaminophen, ALPRAZolam, meloxicam, ondansetron (ZOFRAN) IV, ondansetron    Assessment/Plan: 79 y.o. female s/p ERCP yesterday  OK to d/c home today.  I will communicate the path results from biliary stricture brushing.    Rob Bunting, MD  01/29/2013, 8:51 AM [facility] Pager [phone]

## 2013-01-29 NOTE — Plan of Care (Signed)
Problem: Phase I Progression Outcomes Goal: OOB as tolerated unless otherwise ordered Outcome: Completed/Met Date Met:  01/29/13 Up to [facility] with assist. Also using walker in room to ambulate

## 2013-02-06 DIAGNOSIS — C259 Malignant neoplasm of pancreas, unspecified: Secondary | ICD-10-CM | POA: Diagnosis not present

## 2013-02-06 DIAGNOSIS — J449 Chronic obstructive pulmonary disease, unspecified: Secondary | ICD-10-CM | POA: Diagnosis not present

## 2013-02-06 DIAGNOSIS — J9620 Acute and chronic respiratory failure, unspecified whether with hypoxia or hypercapnia: Secondary | ICD-10-CM | POA: Diagnosis not present

## 2013-02-12 ENCOUNTER — Ambulatory Visit: Payer: Self-pay | Admitting: Internal Medicine

## 2013-02-15 ENCOUNTER — Telehealth: Payer: Self-pay | Admitting: *Deleted

## 2013-02-15 ENCOUNTER — Telehealth: Payer: Self-pay | Admitting: Oncology

## 2013-02-15 NOTE — Telephone Encounter (Signed)
Pt dtr called to r/s 3/20 appt due to message she received that BS will not be in the office. dtr given new appt for 4/4 @ 12:30 pm.

## 2013-02-15 NOTE — Telephone Encounter (Signed)
Call from pt's daughter to reschedule 02/25/13 appt that was canceled due to MD out of office. Left message for Rose in scheduling to contact pt.

## 2013-02-22 ENCOUNTER — Ambulatory Visit: Payer: Medicare Other | Admitting: Oncology

## 2013-02-25 ENCOUNTER — Ambulatory Visit: Payer: Medicare Other | Admitting: Oncology

## 2013-03-09 DIAGNOSIS — J9620 Acute and chronic respiratory failure, unspecified whether with hypoxia or hypercapnia: Secondary | ICD-10-CM | POA: Diagnosis not present

## 2013-03-09 DIAGNOSIS — C259 Malignant neoplasm of pancreas, unspecified: Secondary | ICD-10-CM | POA: Diagnosis not present

## 2013-03-09 DIAGNOSIS — J449 Chronic obstructive pulmonary disease, unspecified: Secondary | ICD-10-CM | POA: Diagnosis not present

## 2013-03-10 DIAGNOSIS — J9620 Acute and chronic respiratory failure, unspecified whether with hypoxia or hypercapnia: Secondary | ICD-10-CM | POA: Diagnosis not present

## 2013-03-10 DIAGNOSIS — J449 Chronic obstructive pulmonary disease, unspecified: Secondary | ICD-10-CM | POA: Diagnosis not present

## 2013-03-10 DIAGNOSIS — C259 Malignant neoplasm of pancreas, unspecified: Secondary | ICD-10-CM | POA: Diagnosis not present

## 2013-03-11 DIAGNOSIS — J449 Chronic obstructive pulmonary disease, unspecified: Secondary | ICD-10-CM | POA: Diagnosis not present

## 2013-03-11 DIAGNOSIS — J9620 Acute and chronic respiratory failure, unspecified whether with hypoxia or hypercapnia: Secondary | ICD-10-CM | POA: Diagnosis not present

## 2013-03-11 DIAGNOSIS — C259 Malignant neoplasm of pancreas, unspecified: Secondary | ICD-10-CM | POA: Diagnosis not present

## 2013-03-12 ENCOUNTER — Ambulatory Visit: Payer: Medicare Other | Admitting: Oncology

## 2013-03-12 DIAGNOSIS — C259 Malignant neoplasm of pancreas, unspecified: Secondary | ICD-10-CM | POA: Diagnosis not present

## 2013-03-12 DIAGNOSIS — J9620 Acute and chronic respiratory failure, unspecified whether with hypoxia or hypercapnia: Secondary | ICD-10-CM | POA: Diagnosis not present

## 2013-03-12 DIAGNOSIS — J449 Chronic obstructive pulmonary disease, unspecified: Secondary | ICD-10-CM | POA: Diagnosis not present

## 2013-03-15 DIAGNOSIS — J9620 Acute and chronic respiratory failure, unspecified whether with hypoxia or hypercapnia: Secondary | ICD-10-CM | POA: Diagnosis not present

## 2013-03-15 DIAGNOSIS — C259 Malignant neoplasm of pancreas, unspecified: Secondary | ICD-10-CM | POA: Diagnosis not present

## 2013-03-15 DIAGNOSIS — J449 Chronic obstructive pulmonary disease, unspecified: Secondary | ICD-10-CM | POA: Diagnosis not present

## 2013-03-16 DIAGNOSIS — C259 Malignant neoplasm of pancreas, unspecified: Secondary | ICD-10-CM | POA: Diagnosis not present

## 2013-03-16 DIAGNOSIS — J449 Chronic obstructive pulmonary disease, unspecified: Secondary | ICD-10-CM | POA: Diagnosis not present

## 2013-03-16 DIAGNOSIS — J9620 Acute and chronic respiratory failure, unspecified whether with hypoxia or hypercapnia: Secondary | ICD-10-CM | POA: Diagnosis not present

## 2013-03-17 DIAGNOSIS — J449 Chronic obstructive pulmonary disease, unspecified: Secondary | ICD-10-CM | POA: Diagnosis not present

## 2013-03-17 DIAGNOSIS — C259 Malignant neoplasm of pancreas, unspecified: Secondary | ICD-10-CM | POA: Diagnosis not present

## 2013-03-17 DIAGNOSIS — J9620 Acute and chronic respiratory failure, unspecified whether with hypoxia or hypercapnia: Secondary | ICD-10-CM | POA: Diagnosis not present

## 2013-03-18 ENCOUNTER — Telehealth: Payer: Self-pay | Admitting: Oncology

## 2013-03-18 ENCOUNTER — Ambulatory Visit (HOSPITAL_BASED_OUTPATIENT_CLINIC_OR_DEPARTMENT_OTHER): Payer: Medicare Other | Admitting: Oncology

## 2013-03-18 VITALS — BP 135/89 | HR 86 | Temp 97.8°F | Resp 18 | Ht 62.0 in | Wt 115.0 lb

## 2013-03-18 DIAGNOSIS — K869 Disease of pancreas, unspecified: Secondary | ICD-10-CM

## 2013-03-18 DIAGNOSIS — M810 Age-related osteoporosis without current pathological fracture: Secondary | ICD-10-CM | POA: Diagnosis not present

## 2013-03-18 NOTE — Telephone Encounter (Signed)
gv and printed appt schedule for pt for OCT °

## 2013-03-18 NOTE — Progress Notes (Signed)
   [facility]    OFFICE PROGRESS NOTE   INTERVAL HISTORY:   She returns as scheduled. No new complaint. She continues to have intermittent discomfort at the right iliac region. She takes tramadol for pain.  She is eating 3 meals a day. She uses a nutrition supplement.  Dr. Christella Hartigan obtain a restaging CT of the abdomen on 01/27/2013. A previously suspected mass in the region of the uncinate process was no longer visualized. No pancreas mass was identified. No features to suggest malignancy. On 01/28/2013 an ERCP revealed growth of tissue through the previously noted metal biliary stent causing a biliary stricture. A new stent was placed across a stricture within the previously placed placed stent. Brushings revealed no malignancy.  Objective:  Vital signs in last 24 hours:  Blood pressure 135/89, pulse 86, temperature 97.8 F (36.6 C), temperature source Oral, resp. rate 18, height 5\' 2"  (1.575 m), weight 115 lb (52.164 kg).    HEENT: Neck without mass, sclera anicteric Lymphatics: No cervical, supra-clavicular, axillary, or inguinal nodes Resp: Lungs clear bilaterally Cardio: Regular rate and rhythm GI: No hepatomegaly, nontender, no mass Vascular: The left lower leg is slightly larger than the right side. No erythema or tenderness.      Medications: I have reviewed the patient's current medications.  Assessment/Plan: 1.Pancreas head mass-status post an ERCP brushing and EUS biopsy that were nondiagnostic , the clinical history was consistent with pancreas cancer. The patient and her family were comfortable with no further diagnostic evaluation. Repeat CT 01/27/2013 with no evidence of malignancy, specifically no pancreas mass was identified. 2. Obstructive jaundice secondary to pancreas head mass-status post placement of a bile duct stent 03/24/2012.  3. Weight loss- initially improved with Megace and prednisone. Recent weight loss may be related to the admission  with sepsis  4. Cough- chest x-ray and CT on 05/07/12 confirmed extensive infiltrate in the right lung, she completed 10 days of antibiotics. There is a persistent cough and abnormal lung exam . She completed another course of antibiotics under the direction of Dr.Young. She is now maintained on home oxygen and prednisone. The dyspnea has improved. A chest x-ray was improved on 10/03/2012.  5. acute low back/abdominal pain in November 2013-a plain x-ray on 10/29/2012 confirmed a new T10 compression fracture  6. Admission to [facility] hospital in late December of 2013 with "sepsis " -likely secondary to biliary sepsis from an obstructed stent, status post ERCP with placement of a new stent on 01/28/2013   Disposition:  Bridget Walker appears stable. I discussed the recent ERCP/CT findings with Bridget Walker and her family. She most likely does not have pancreas cancer.  She plans to followup with Dr. Maple Hudson for the history of lung infiltrates. She would like to come off of prednisone. She knows to contact Dr. Christella Hartigan for a fever or chills.  She would like to continue followup at the cancer Center. She will return for an office visit here in 6 months. Bridget Walker will be discharged from the [facility] hospice program. She will try discontinuing the pancreatic enzyme replacement.   Thornton Papas, MD  03/18/2013  1:27 PM

## 2013-03-19 ENCOUNTER — Telehealth: Payer: Self-pay | Admitting: *Deleted

## 2013-03-19 ENCOUNTER — Encounter: Payer: Self-pay | Admitting: Oncology

## 2013-03-19 DIAGNOSIS — J449 Chronic obstructive pulmonary disease, unspecified: Secondary | ICD-10-CM | POA: Diagnosis not present

## 2013-03-19 DIAGNOSIS — C259 Malignant neoplasm of pancreas, unspecified: Secondary | ICD-10-CM | POA: Diagnosis not present

## 2013-03-19 DIAGNOSIS — J9620 Acute and chronic respiratory failure, unspecified whether with hypoxia or hypercapnia: Secondary | ICD-10-CM | POA: Diagnosis not present

## 2013-03-19 NOTE — Telephone Encounter (Signed)
Left message to D/C from Hospice services per Dr. Truett Perna. Per office visit and recent scans-highly probable she does not have cancer. Also notified financial counselor of change.

## 2013-03-19 NOTE — Progress Notes (Signed)
Darl Pikes left me a message that the patient is cancer free and no longer has Hospice as insurance.-- see the previous notes.

## 2013-03-22 ENCOUNTER — Encounter: Payer: Self-pay | Admitting: Internal Medicine

## 2013-03-22 ENCOUNTER — Ambulatory Visit (INDEPENDENT_AMBULATORY_CARE_PROVIDER_SITE_OTHER)
Admission: RE | Admit: 2013-03-22 | Discharge: 2013-03-22 | Disposition: A | Discharge status: Home / Self Care | Payer: Medicare Other | Source: Ambulatory Visit | Attending: Internal Medicine | Admitting: Internal Medicine

## 2013-03-22 ENCOUNTER — Ambulatory Visit (INDEPENDENT_AMBULATORY_CARE_PROVIDER_SITE_OTHER): Payer: Medicare Other | Admitting: Internal Medicine

## 2013-03-22 VITALS — BP 112/66 | HR 82 | Ht 60.0 in | Wt 117.4 lb

## 2013-03-22 DIAGNOSIS — R000 Tachycardia, unspecified: Secondary | ICD-10-CM

## 2013-03-22 DIAGNOSIS — J13 Pneumonia due to Streptococcus pneumoniae: Secondary | ICD-10-CM

## 2013-03-22 DIAGNOSIS — C259 Malignant neoplasm of pancreas, unspecified: Secondary | ICD-10-CM | POA: Diagnosis not present

## 2013-03-22 DIAGNOSIS — J4489 Other specified chronic obstructive pulmonary disease: Secondary | ICD-10-CM

## 2013-03-22 DIAGNOSIS — J438 Other emphysema: Secondary | ICD-10-CM

## 2013-03-22 DIAGNOSIS — J9620 Acute and chronic respiratory failure, unspecified whether with hypoxia or hypercapnia: Secondary | ICD-10-CM | POA: Diagnosis not present

## 2013-03-22 DIAGNOSIS — J449 Chronic obstructive pulmonary disease, unspecified: Secondary | ICD-10-CM

## 2013-03-22 DIAGNOSIS — R911 Solitary pulmonary nodule: Secondary | ICD-10-CM

## 2013-03-22 DIAGNOSIS — J181 Lobar pneumonia, unspecified organism: Secondary | ICD-10-CM

## 2013-03-22 DIAGNOSIS — I493 Ventricular premature depolarization: Secondary | ICD-10-CM

## 2013-03-22 DIAGNOSIS — J439 Emphysema, unspecified: Secondary | ICD-10-CM

## 2013-03-22 DIAGNOSIS — I4949 Other premature depolarization: Secondary | ICD-10-CM

## 2013-03-22 DIAGNOSIS — J984 Other disorders of lung: Secondary | ICD-10-CM | POA: Diagnosis not present

## 2013-03-22 NOTE — Patient Instructions (Addendum)
Order- CXR dx lung nodule  EKG- done  We can reduce your prednisone to 5 mg (1/2 x 10 mg tab) once daily  [facility] 2L for sleep and as needed portable   Dx COPD, Organizing pneumonia.   She was getting it through [facility], and then through Hospice. Now living at [facility] in [place]. Does she need to change DME companies?  Order- ONOX on room air   Dx COPD

## 2013-03-22 NOTE — Progress Notes (Signed)
06/08/12- 20 yoF former smoker  referred by Dr Benay Spice. Pt states having Increase SOB upon activity Productive cough in am . PCP Dr Loura Pardon    Son here She is being monitored for possible pancreatic tumor and has history of a biliary stent. For about 3 months she has been aware of dyspnea on exertion while walking, relieved by sitting. She had a "mild outpatient pneumonia" treated 2 weeks ago. She had been exercising at the "[facility]. She was noting more shortness of breath with exertion after bad cold and some shingles. Weight had been going down. She has been drinking diet supplements to gain weight. She denies cough, wheeze, chest pain. She does hear a rattle but coughs out nothing. Legs feel weak. She denies fever, blood or swollen glands. She denies past history of asthma or pneumonia and she denies heart disease, reflux, choking with meals. She is widowed, living in a retirement center. 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 Father died with pneumonia, mother died with MI. CT chest was concerning for density, more than a "mild pneumonia". CT chest 05/07/12- reviewed with her IMPRESSION:  No evidence of significant pulmonary embolus. The extensive  consolidation of the right lower lung and right middle lung with  patchy areas of nodular infiltration in the left lower lung.  Changes may represent pneumonia. No definite evidence of a central  obstructing lesion although the distal right main stem bronchus is  somewhat narrowed and a cold mass may be obscured by the  consolidation. Recommend follow up after resolution of acute  symptoms. Large esophageal hiatal hernia with mild esophageal  dilatation. Esophagitis versus dysmotility. Biliary wall stent  and biliary gas demonstrated in the upper abdomen.  Original Report Authenticated By: Neale Burly, M.D.   CXR 06/03/12- IMPRESSION:  1. Multi focal  right lung airspace disease has not significantly  changed since 05/07/2012.  2. Interval increased patchy opacity at the left lung base.  Original Report Authenticated By: Randall An, M.D.   07/07/12- 56 yoF former smoker followed for pneumonia complicated by large hiatal hernia, pancreatic cancer. Dr Sherrill/ Onc> Hospice. Daughter and son here today. She is now followed by hospice because of pancreatic cancer with biliary stent. Notices dyspnea standing, and cough as she gets out of bed and starts moving. Mostly clear phlegm, occasional scant green. Wakes herself coughing. Avelox did not help. We reviewed the CT scan images again, nothing large hiatal hernia full of food and parenchymal consolidation consistent with pneumonia and especially consistent with aspiration pneumonia. Discussed possibility of organizing pneumonia.  07/27/12-  55 yoF former smoker followed for pneumonia complicated by large hiatal hernia, pancreatic cancer.  Dtr here. Sputum CX 06/10/12- Nl flora, neg for AFB. Dr Ardis Hughs has indicated steroid trial, aimed at organizing pneumonia, is ok from pancreas standpoint.  Very short of breath after shower. Home oxygen helps, provided by Hospice through? [employer]. No pain, fever, sweat. Scant clear mucus. Flutter device helps shortness of breath after use, but does not generate productive cough. She denies any choking or strangling with meals or during sleep. CXR I 07/27/12- images reviewed with them. IMPRESSION:  1. Worsening asymmetric airspace disease involving right lung  greater than left, suspicious for pneumonia.  2. Stable mild cardiomegaly.  3. Hiatal hernia again noted.  Original Report Authenticated By: Marlaine Hind, M.D.   08/13/12- 22 yoF former smoker followed for  pneumonia complicated by large hiatal hernia, pancreatic cancer.  Dtr here.  Pt reports legs no longer hurt, no more coughing in the morning, wearing O2 most of the time--not today. Has continued  maintenance prednisone 20 mg daily and recognizes breathing is better but it gives her some insomnia. Has elevated head of bed. Using oxygen at 2 L only as needed now. Not coughing at all. Wants flu vax CXR 08/13/12- IMPRESSION:  Improved right lower lobe and left lung base infiltrates.  Underlying COPD. No new abnormalities.   10/02/12- 63 yoF former smoker followed for pneumonia complicated by large hiatal hernia, pancreatic cancer.  Dtr here. Follow-Up  - has been feeling very anxious lately. Hospice nurse thinks that she needs something for anxiety. Prednisone seemed to reduce the amount of parenchymal density, consistent with impression that it was organizing pneumonia. She is followed with a hospice nurse. Reducing prednisone to 5 mg daily left her too short of breath, so  she went back to 10 mg daily. Less morning cough. Recent night sweats. Low back pain sometimes radiates to abdomen. CXR 10/02/12- we reviewed the images together IMPRESSION:  Improved airspace disease in the right mid and lower lung zones.  Persistent opacity is noted  Stable spiculated opacity at the left lung base. Underlying  malignancy cannot be excluded.  Original Report Authenticated By: Donavan Burnet, M.D.   03/22/13- 89 yoF former smoker followed for pneumonia complicated by large hiatal hernia.  Dtr here. FOLLOWS FOR: breathing doing good as long as wearing O2; daughter states that pt is still breathy; hospice is pulling out as pt does not have pancreatic cancer; will need order for O2 through DME company since hospice is no longer going to help with this.  Has been using prednisone 10 mg alternating with 5 mg every other day. Makes her anxious. Mentions pains and right rib cage that seem to come and go.  ROS-see HPI Constitutional:   No-  weight loss, night sweats, fevers, chills, fatigue, lassitude. HEENT:   No-  headaches, difficulty swallowing, tooth/dental problems, sore throat,       No-  sneezing,  itching, ear ache, nasal congestion, post nasal drip,  CV:  No-   chest pain, orthopnea, PND, swelling in lower extremities, anasarca,dizziness, palpitations Resp: Less shortness of breath with exertion or at rest.              No- productive cough,  No non-productive cough,  No- coughing up of blood.              No-   change in color of mucus.  No- wheezing.   Skin: No-   rash or lesions. GI:  No-   heartburn, indigestion, abdominal pain, nausea, vomiting,  GU:  MS:  No-   joint pain or swelling.  +back pain Neuro-     nothing unusual Psych:  No- change in mood or affect.  depression or +anxiety.  No memory loss.  OBJ- Physical Exam General- Alert, Oriented, Affect-appropriate, Distress- none acute, thin elderly woman Skin- rash-none, lesions- none, excoriation- none Lymphadenopathy- none Head- atraumatic            Eyes- Gross vision intact, PERRLA, conjunctivae and secretions clear            Ears- Hearing, canals-normal            Nose- Clear, no-Septal dev, mucus, polyps, erosion, perforation             Throat- Mallampati II ,  mucosa clear , drainage- none, tonsils- atrophic. Own teeth Neck- flexible , trachea midline, no stridor , thyroid nl, carotid no bruit Chest - symmetrical excursion , unlabored           Heart/CV- RRR/few extra beats , no murmur , no gallop  , no rub, nl s1 s2                           - JVD- none , edema- none, stasis changes- none, varices- none           Lung- + few crackles, rub- none, no cough or wheeze           Chest wall-  Abd-  Br/ Gen/ Rectal- Not done, not indicated Extrem- cyanosis- none, clubbing, none, atrophy- none, strength- nl Neuro- grossly intact to observation

## 2013-03-23 DIAGNOSIS — C259 Malignant neoplasm of pancreas, unspecified: Secondary | ICD-10-CM | POA: Diagnosis not present

## 2013-03-23 DIAGNOSIS — J449 Chronic obstructive pulmonary disease, unspecified: Secondary | ICD-10-CM | POA: Diagnosis not present

## 2013-03-23 DIAGNOSIS — J9620 Acute and chronic respiratory failure, unspecified whether with hypoxia or hypercapnia: Secondary | ICD-10-CM | POA: Diagnosis not present

## 2013-03-23 NOTE — Progress Notes (Signed)
Quick Note:  Called spoke with patient, advised of cxr results / recs as stated by CY. Pt verbalized her understanding and denied any questions. ______

## 2013-03-24 DIAGNOSIS — J449 Chronic obstructive pulmonary disease, unspecified: Secondary | ICD-10-CM | POA: Diagnosis not present

## 2013-03-24 DIAGNOSIS — C259 Malignant neoplasm of pancreas, unspecified: Secondary | ICD-10-CM | POA: Diagnosis not present

## 2013-03-24 DIAGNOSIS — J9620 Acute and chronic respiratory failure, unspecified whether with hypoxia or hypercapnia: Secondary | ICD-10-CM | POA: Diagnosis not present

## 2013-03-25 ENCOUNTER — Telehealth: Payer: Self-pay | Admitting: Internal Medicine

## 2013-03-25 ENCOUNTER — Telehealth: Payer: Self-pay | Admitting: *Deleted

## 2013-03-25 DIAGNOSIS — C259 Malignant neoplasm of pancreas, unspecified: Secondary | ICD-10-CM | POA: Diagnosis not present

## 2013-03-25 DIAGNOSIS — J9620 Acute and chronic respiratory failure, unspecified whether with hypoxia or hypercapnia: Secondary | ICD-10-CM | POA: Diagnosis not present

## 2013-03-25 DIAGNOSIS — J449 Chronic obstructive pulmonary disease, unspecified: Secondary | ICD-10-CM | POA: Diagnosis not present

## 2013-03-25 NOTE — Telephone Encounter (Signed)
That is ok-please give the verbal order

## 2013-03-25 NOTE — Telephone Encounter (Signed)
Received voicemail from [place] with [facility] saying they need orders on pt for PT, OT, evaluation and treatment, with the diagnosis of evaluation for assistive devices and strengthening, Angelica Chessman request the order faxed to [phone], and if we have any questions we can call her back on her cell # [phone], please advise

## 2013-03-25 NOTE — Telephone Encounter (Signed)
lmomtcb for pts daughter joyce.

## 2013-03-25 NOTE — Telephone Encounter (Signed)
Calling again in ref to previous msg can be reached at [phone].Bridget Walker

## 2013-03-25 NOTE — Telephone Encounter (Signed)
done

## 2013-03-28 DIAGNOSIS — I493 Ventricular premature depolarization: Secondary | ICD-10-CM | POA: Insufficient documentation

## 2013-03-28 DIAGNOSIS — J449 Chronic obstructive pulmonary disease, unspecified: Secondary | ICD-10-CM | POA: Insufficient documentation

## 2013-03-28 NOTE — Assessment & Plan Note (Signed)
Irregular pulse noted during ambulation for oxygen assessment. EKG shows asymptomatic PVCs, noted for the record and

## 2013-03-28 NOTE — Assessment & Plan Note (Signed)
Oxygen 2 L sleep and exertion

## 2013-03-28 NOTE — Assessment & Plan Note (Signed)
Plan-follow-up chest x-ray 

## 2013-03-28 NOTE — Assessment & Plan Note (Addendum)
Infiltrate treated as BOOP with low-dose prednisone. Plan-reduce prednisone to 5 mg daily (1/2x10 mg tablet). Steroid talk. Followup chest x-ray.

## 2013-03-29 DIAGNOSIS — J9620 Acute and chronic respiratory failure, unspecified whether with hypoxia or hypercapnia: Secondary | ICD-10-CM | POA: Diagnosis not present

## 2013-03-29 DIAGNOSIS — C259 Malignant neoplasm of pancreas, unspecified: Secondary | ICD-10-CM | POA: Diagnosis not present

## 2013-03-29 DIAGNOSIS — J449 Chronic obstructive pulmonary disease, unspecified: Secondary | ICD-10-CM | POA: Diagnosis not present

## 2013-03-29 NOTE — Telephone Encounter (Signed)
Returning call can be reached at [phone] at 3:30 until.Raylene Everts

## 2013-03-29 NOTE — Telephone Encounter (Signed)
ATC no answer,lmomtcb

## 2013-03-29 NOTE — Telephone Encounter (Signed)
Notes Recorded by Waymon Budge, MD on 03/22/2013 at 5:19 PM CXR- stable, with hiatal hernia, some sacrring. Nothing looks new or active  Spoke with Alona Bene and notified of results per CDY She verbalized understanding and states nothing further needed

## 2013-03-30 ENCOUNTER — Telehealth: Payer: Self-pay | Admitting: Internal Medicine

## 2013-03-30 DIAGNOSIS — C259 Malignant neoplasm of pancreas, unspecified: Secondary | ICD-10-CM | POA: Diagnosis not present

## 2013-03-30 DIAGNOSIS — J9620 Acute and chronic respiratory failure, unspecified whether with hypoxia or hypercapnia: Secondary | ICD-10-CM | POA: Diagnosis not present

## 2013-03-30 DIAGNOSIS — J449 Chronic obstructive pulmonary disease, unspecified: Secondary | ICD-10-CM | POA: Diagnosis not present

## 2013-03-30 NOTE — Telephone Encounter (Signed)
I spoke with pt daughter and advised we will check into this.Marland KitchenMarland [facility]St [facility] pt? It was placed on 03-22-13 and I cannot tell if anything has been done with it? Thanks. Carron Curie, CMA

## 2013-03-31 ENCOUNTER — Telehealth: Payer: Self-pay | Admitting: Internal Medicine

## 2013-03-31 DIAGNOSIS — J449 Chronic obstructive pulmonary disease, unspecified: Secondary | ICD-10-CM | POA: Diagnosis not present

## 2013-03-31 DIAGNOSIS — C259 Malignant neoplasm of pancreas, unspecified: Secondary | ICD-10-CM | POA: Diagnosis not present

## 2013-03-31 DIAGNOSIS — J9620 Acute and chronic respiratory failure, unspecified whether with hypoxia or hypercapnia: Secondary | ICD-10-CM | POA: Diagnosis not present

## 2013-03-31 NOTE — Telephone Encounter (Signed)
Order refaxed to [facility] for ono to be done Tobe Sos

## 2013-03-31 NOTE — Telephone Encounter (Signed)
Spoke to [email or web address] and she will call daughter right now to arrange this Tobe Sos

## 2013-04-01 ENCOUNTER — Other Ambulatory Visit: Payer: Self-pay | Admitting: *Deleted

## 2013-04-01 DIAGNOSIS — J9620 Acute and chronic respiratory failure, unspecified whether with hypoxia or hypercapnia: Secondary | ICD-10-CM | POA: Diagnosis not present

## 2013-04-01 DIAGNOSIS — J4489 Other specified chronic obstructive pulmonary disease: Secondary | ICD-10-CM | POA: Diagnosis not present

## 2013-04-01 DIAGNOSIS — C259 Malignant neoplasm of pancreas, unspecified: Secondary | ICD-10-CM | POA: Diagnosis not present

## 2013-04-01 DIAGNOSIS — J449 Chronic obstructive pulmonary disease, unspecified: Secondary | ICD-10-CM | POA: Diagnosis not present

## 2013-04-01 MED ORDER — TRAMADOL HCL 50 MG PO TABS: 50.0000 mg | ORAL_TABLET | Freq: Two times a day (BID) | ORAL | Status: DC | PRN

## 2013-04-01 MED ORDER — MEGESTROL ACETATE 40 MG/ML PO SUSP: 400.0000 mg | Freq: Every day | ORAL | Status: DC

## 2013-04-01 MED ORDER — ESOMEPRAZOLE MAGNESIUM 40 MG PO CPDR: 40.0000 mg | DELAYED_RELEASE_CAPSULE | Freq: Every day | ORAL | Status: DC

## 2013-04-01 MED ORDER — ALPRAZOLAM 0.25 MG PO TABS: 0.2500 mg | ORAL_TABLET | Freq: Three times a day (TID) | ORAL | Status: DC | PRN

## 2013-04-01 MED ORDER — MELOXICAM 7.5 MG PO TABS: 7.5000 mg | ORAL_TABLET | Freq: Every day | ORAL | Status: DC

## 2013-04-01 MED ORDER — PREDNISONE 5 MG PO TABS: 5.0000 mg | ORAL_TABLET | Freq: Every day | ORAL | Status: DC

## 2013-04-01 MED ORDER — AMYLASE-LIPASE-PROTEASE 12-4-12 MU PO CPEP: 3.0000 | ORAL_CAPSULE | Freq: Three times a day (TID) | ORAL | Status: DC

## 2013-04-01 NOTE — Telephone Encounter (Signed)
Received fax from hospice requesting one month supply of all pt's medications to be sent to her pharmacy as pt is being discharged from hospice. OK, per Dr. Truett Perna. Rx sent to [facility] [place] [facility].

## 2013-04-02 ENCOUNTER — Telehealth: Payer: Self-pay

## 2013-04-02 DIAGNOSIS — J9620 Acute and chronic respiratory failure, unspecified whether with hypoxia or hypercapnia: Secondary | ICD-10-CM | POA: Diagnosis not present

## 2013-04-02 DIAGNOSIS — J449 Chronic obstructive pulmonary disease, unspecified: Secondary | ICD-10-CM | POA: Diagnosis not present

## 2013-04-02 DIAGNOSIS — C259 Malignant neoplasm of pancreas, unspecified: Secondary | ICD-10-CM | POA: Diagnosis not present

## 2013-04-02 NOTE — Telephone Encounter (Signed)
Cherie Ouch pt's daughter left v/m to let Dr Milinda Antis know that pt is doing well; about one year ago thought pt had pancreatic CA; Dr Christella Hartigan diagnosed with pancreatic CA; Alona Bene said Dr told them that mass that was there has disappeared; pt is at [facility] in assisted living and doing well. Hospice is discharging pt today. Pt has CPX 08/11/13 with Dr Milinda Antis. Alona Bene wanted to share good news and will discuss more detail at CPX.

## 2013-04-02 NOTE — Telephone Encounter (Signed)
I left her a message - telling her how thrilled I am about the news - I'm so glad Bridget Walker is doing well! I look forward to seeing her at the next office visit

## 2013-04-05 DIAGNOSIS — M6281 Muscle weakness (generalized): Secondary | ICD-10-CM | POA: Diagnosis not present

## 2013-04-05 DIAGNOSIS — R262 Difficulty in walking, not elsewhere classified: Secondary | ICD-10-CM | POA: Diagnosis not present

## 2013-04-05 DIAGNOSIS — M549 Dorsalgia, unspecified: Secondary | ICD-10-CM | POA: Diagnosis not present

## 2013-04-07 DIAGNOSIS — R262 Difficulty in walking, not elsewhere classified: Secondary | ICD-10-CM | POA: Diagnosis not present

## 2013-04-07 DIAGNOSIS — M6281 Muscle weakness (generalized): Secondary | ICD-10-CM | POA: Diagnosis not present

## 2013-04-07 DIAGNOSIS — M549 Dorsalgia, unspecified: Secondary | ICD-10-CM | POA: Diagnosis not present

## 2013-04-08 ENCOUNTER — Telehealth: Payer: Self-pay | Admitting: Internal Medicine

## 2013-04-08 ENCOUNTER — Encounter: Payer: Self-pay | Admitting: Family Medicine

## 2013-04-08 ENCOUNTER — Ambulatory Visit (INDEPENDENT_AMBULATORY_CARE_PROVIDER_SITE_OTHER): Payer: Medicare Other | Admitting: Family Medicine

## 2013-04-08 VITALS — BP 142/82 | HR 88 | Temp 98.2°F | Wt 115.5 lb

## 2013-04-08 DIAGNOSIS — R0781 Pleurodynia: Secondary | ICD-10-CM

## 2013-04-08 DIAGNOSIS — R079 Chest pain, unspecified: Secondary | ICD-10-CM | POA: Diagnosis not present

## 2013-04-08 DIAGNOSIS — J439 Emphysema, unspecified: Secondary | ICD-10-CM

## 2013-04-08 NOTE — Telephone Encounter (Signed)
Dr. Maple Hudson do you have pt's ONO. Please advise thanks

## 2013-04-08 NOTE — Telephone Encounter (Signed)
Per CY-let patient know that she quailfies for O2 for sleep based on ONO; okay to place order to [facility] O2 2L/M sleep and prn Dx COPD

## 2013-04-08 NOTE — Telephone Encounter (Signed)
i spoke with Bridget Walker and is aware she stated not to put PRN in the order bc medicare will not approve that. Order has been sent.  I spoke with patient about results and she verbalized understanding and had no questions

## 2013-04-08 NOTE — Progress Notes (Signed)
Nature conservation officer at [facility] [address] [place] [address] Phone: [phone] Fax: [phone]  Date:  04/08/2013   Name:  Bridget Walker   DOB:  [birth year]   MRN:  [id number] Gender: female Age: 79 y.o.  Primary Physician:  [name], MD  Evaluating MD: Hannah Beat, MD   Chief Complaint: Back Pain   History of Present Illness:  Bridget Walker is a 79 y.o. pleasant patient who presents with the following:  Patient was at PT, placed on bike, then reports pain in posterior R side and back. Now with significant pain. Normally walks with a walker. In wheelchair now. H/o prior compression fracture.  Patient Active Problem List   Diagnosis Date Noted  . Frequent PVCs 03/28/2013  . COPD with emphysema 03/28/2013  . Lung nodule, L lower lobe 10/15/2012  . Lobar pneumonia due to unspecified organism 06/11/2012  . Abnormal CT scan, gastrointestinal tract 03/24/2012  . Nonspecific (abnormal) findings on radiological and other examination of biliary tract 03/24/2012  . Weight loss 03/18/2012  . Fatigue 03/18/2012  . Jaundice 03/18/2012  . Loss of appetite 03/18/2012  . Anemia 11/29/2011  . TINNITUS 08/09/2010  . OVERACTIVE BLADDER 03/14/2010  . GERD 11/06/2009  . UNSPECIFIED VITAMIN D DEFICIENCY 10/31/2008  . DIVERTICULOSIS OF COLON 12/25/2007  . RESTLESS LEG SYNDROME 10/26/2007  . COLONIC POLYPS, HX OF 10/26/2007  . HYPERCHOLESTEROLEMIA 09/23/2007  . OSTEOARTHRITIS 09/23/2007  . OSTEOPOROSIS 09/23/2007    Past Medical History  Diagnosis Date  . Osteoporosis   . Hyperlipidemia   . RLS (restless legs syndrome)   . Vitamin D deficiency   . GERD (gastroesophageal reflux disease)   . Cerumen impaction     recurrent  . Unexplained weight loss   . Cough   . Constipation   . Diarrhea   . Shingles   . Overactive bladder   . Cancer     pancreatic  . Arthritis     OA, in hands    Past Surgical History  Procedure Laterality Date  . Cataract  extraction      patient unsure of date procedure was done.  . Cataract extraction  11/08    2nd cataract  . Ercp  03/24/2012    Procedure: ENDOSCOPIC RETROGRADE CHOLANGIOPANCREATOGRAPHY (ERCP);  Surgeon: Rachael Fee, MD;  Location: [facility];  Service: Endoscopy;  Laterality: N/A;  . Eus  04/02/2012    Procedure: ESOPHAGEAL ENDOSCOPIC ULTRASOUND (EUS) RADIAL;  Surgeon: Rachael Fee, MD;  Location: [facility];  Service: Endoscopy;  Laterality: N/A;  EUS with FNA  . Fine needle aspiration  04/02/2012    Procedure: FINE NEEDLE ASPIRATION (FNA) LINEAR;  Surgeon: Rachael Fee, MD;  Location: [facility] END[facility];  Service: Endoscopy;  Laterality: N/A;  . Endoscopic retrograde cholangiopancreatography (ercp) with propofol N/A 01/28/2013    Procedure: ENDOSCOPIC RETROGRADE CHOLANGIOPANCREATOGRAPHY (ERCP) WITH PROPOFOL;  Surgeon: Rachael Fee, MD;  Location: [facility];  Service: Endoscopy;  Laterality: N/A;    History   Social History  . Marital Status: Widowed    Spouse Name: N/A    Number of Children: 3  . Years of Education: N/A   Occupational History  .      RETIRED    Social History Main Topics  . Smoking status: Former Smoker -- 35 years    Types: Cigarettes    Quit date: 03/23/1992  . Smokeless tobacco: Never Used     Comment: PT SMOKED 2 CIGARETTES A DAY   .  Alcohol Use: No  . Drug Use: No  . Sexually Active: No     Comment: not applicable   Other Topics Concern  . Not on file   Social History Narrative   Resides at [facility] in [place] (Independent Living)   Widow    Family History  Problem Relation Age of Onset  . Diabetes Sister   . Hypertension Sister   . Heart disease Brother   . Fibromyalgia Daughter   . Alcohol abuse Son   . Heart disease Brother   . Diabetes Brother   . Heart disease Mother   . Pneumonia Father   . Cancer Other     niece had breast and bone cancer  . Heart failure Father     Allergies  Allergen  Reactions  . Lactose Intolerance (Gi)     Pt can drink skim milk  . Hydrocodone Rash    Patient unsure of reaction, but it may have been diarrhea.  . Penicillins Rash    On scalp    Medication list has been reviewed and updated.  Outpatient Prescriptions Prior to Visit  Medication Sig Dispense Refill  . ALPRAZolam (XANAX) 0.25 MG tablet Take 1 tablet (0.25 mg total) by mouth 3 (three) times daily as needed for anxiety.  50 tablet  0  . amylase-lipase-protease (PANCREASE MT 4) 11-12-11 MU per capsule Take 3 capsules by mouth 3 (three) times daily with meals.  270 capsule  0  . esomeprazole (NEXIUM) 40 MG capsule Take 1 capsule (40 mg total) by mouth daily before breakfast.  30 capsule  0  . ibuprofen (ADVIL,MOTRIN) 200 MG tablet Take 400 mg by mouth every 6 (six) hours as needed for pain.      . megestrol (MEGACE) 40 MG/ML suspension Take 10 mLs (400 mg total) by mouth daily.  240 mL  0  . meloxicam (MOBIC) 7.5 MG tablet Take 1 tablet (7.5 mg total) by mouth daily.  30 tablet  0  . polyethylene glycol (MIRALAX / GLYCOLAX) packet Take 17 g by mouth daily as needed (for constipation).      . predniSONE (DELTASONE) 5 MG tablet Take 1 tablet (5 mg total) by mouth daily.  30 tablet  0  . traMADol (ULTRAM) 50 MG tablet Take 1 tablet (50 mg total) by mouth 2 (two) times daily as needed for pain.  60 tablet  0   No facility-administered medications prior to visit.    Review of Systems:   GEN: No fevers, chills. Nontoxic. Primarily MSK c/o today. MSK: Detailed in the HPI GI: tolerating PO intake without difficulty Neuro: No numbness, parasthesias, or tingling associated. Otherwise the pertinent positives of the ROS are noted above.    Physical Examination: BP 142/82  Pulse 88  Temp(Src) 98.2 F (36.8 C) (Oral)  Wt 115 lb 8 oz (52.39 kg)  BMI 22.56 kg/m2  Ideal Body Weight:     GEN: WDWN, NAD, Non-toxic, Alert & Oriented x 3 HEENT: Atraumatic, Normocephalic.  Ears and Nose: No  external deformity. EXTR: No clubbing/cyanosis/edema Chest: Sternum nt, L chest wall NT. R posterior rib, approx 10, with dramatic ttp. NT throughout spine.  PSYCH: Normally interactive. Conversant. Not depressed or anxious appearing.  Calm demeanor.    Assessment and Plan:  Rib pain on right side  C/w rib fx vs intercostal muscle tear. Relative rest, tylenol, motrin prn. Tramadol for bad pain. Given rib belt.   Orders Today:  No orders of the defined types  were placed in this encounter.    Updated Medication List: (Includes new medications, updates to list, dose adjustments) No orders of the defined types were placed in this encounter.    Medications Discontinued: There are no discontinued medications.    Signed, Elpidio Galea. Linetta Regner, MD 04/08/2013 4:23 PM

## 2013-04-08 NOTE — Patient Instructions (Addendum)
Ibuprofen 200 mg, 1-2 tabs every 8 hours Tylenol OTC, 1-2 tabs po every 6 hours Tramadol, 1 tab up to every 6 hours

## 2013-04-09 DIAGNOSIS — M549 Dorsalgia, unspecified: Secondary | ICD-10-CM | POA: Diagnosis not present

## 2013-04-09 DIAGNOSIS — M6281 Muscle weakness (generalized): Secondary | ICD-10-CM | POA: Diagnosis not present

## 2013-04-09 DIAGNOSIS — R262 Difficulty in walking, not elsewhere classified: Secondary | ICD-10-CM | POA: Diagnosis not present

## 2013-04-12 ENCOUNTER — Telehealth: Payer: Self-pay | Admitting: *Deleted

## 2013-04-12 IMAGING — RF DG ERCP WO/W SPHINCTEROTOMY
5 series · 5 of 5 positions shown · non-contrast
Comparison: CT 01/26/2013 and ERCP images from 03/24/2012

CLINICAL DATA: Common bile duct stricture.

ERCP
TECHNIQUE: Multiple spot images obtained with the fluoroscopic
device and submitted for interpretation post-procedure.  ERCP was
performed by Dr. Jeanbaptiste.

[Series 2: cont. · 1 of 1 slices shown]
[im 1/1]
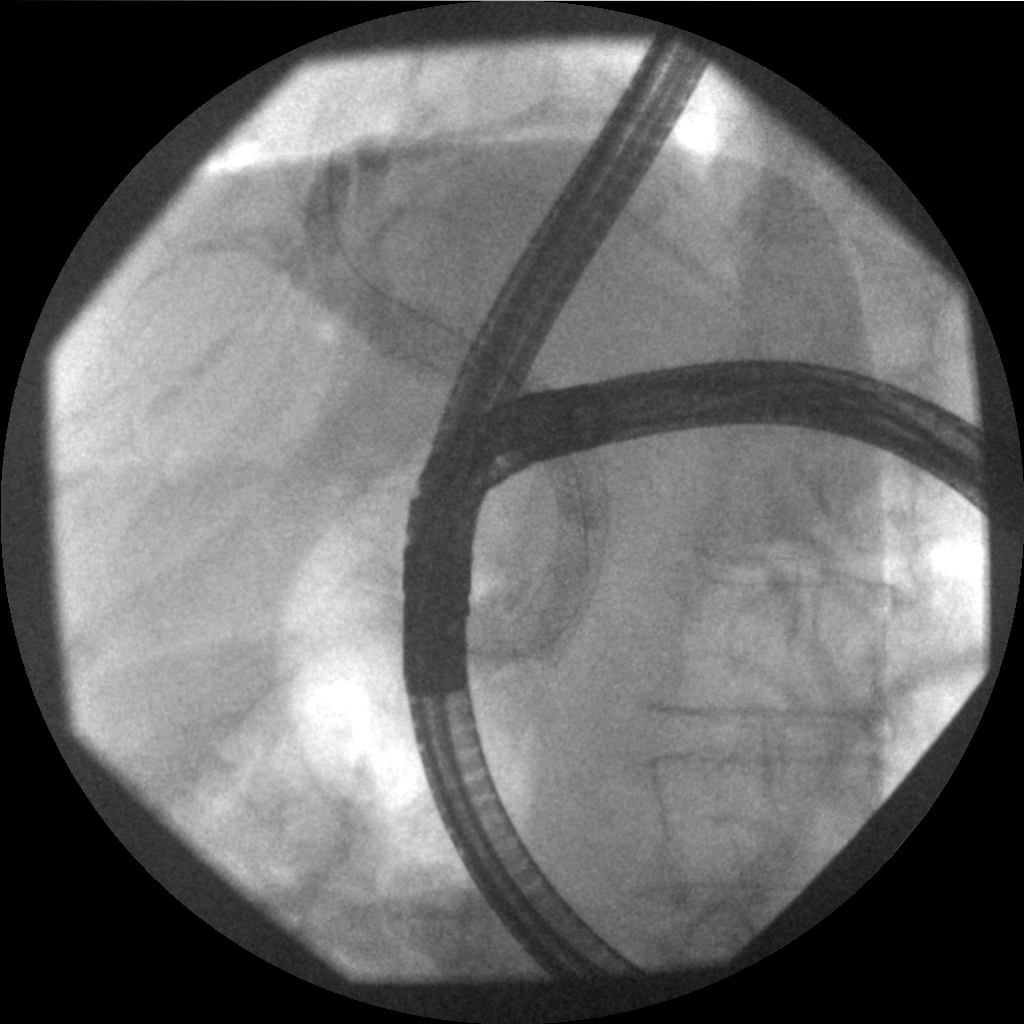

[Series 3: dr · 1 of 1 slices shown (1 of 4)]
[im 1/1]
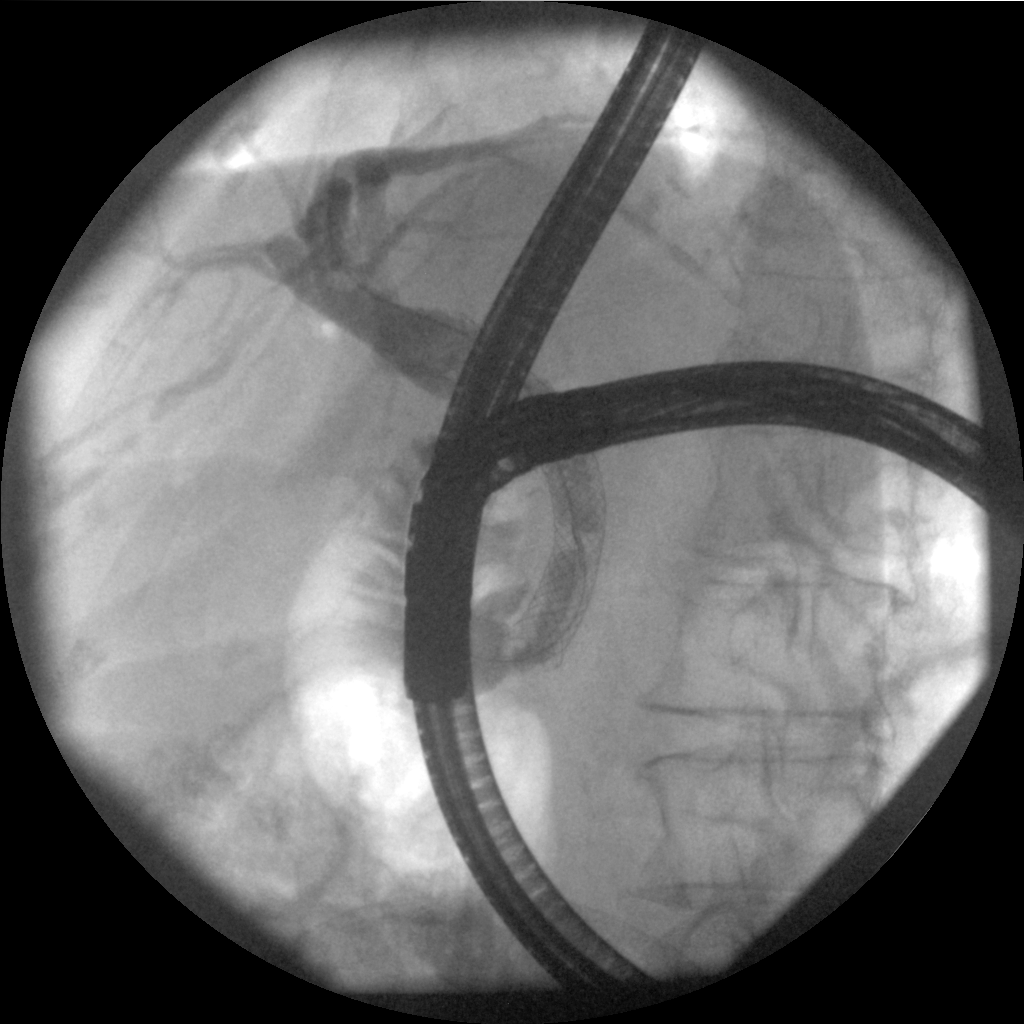

[Series 4: dr · 1 of 1 slices shown (2 of 4)]
[im 1/1]
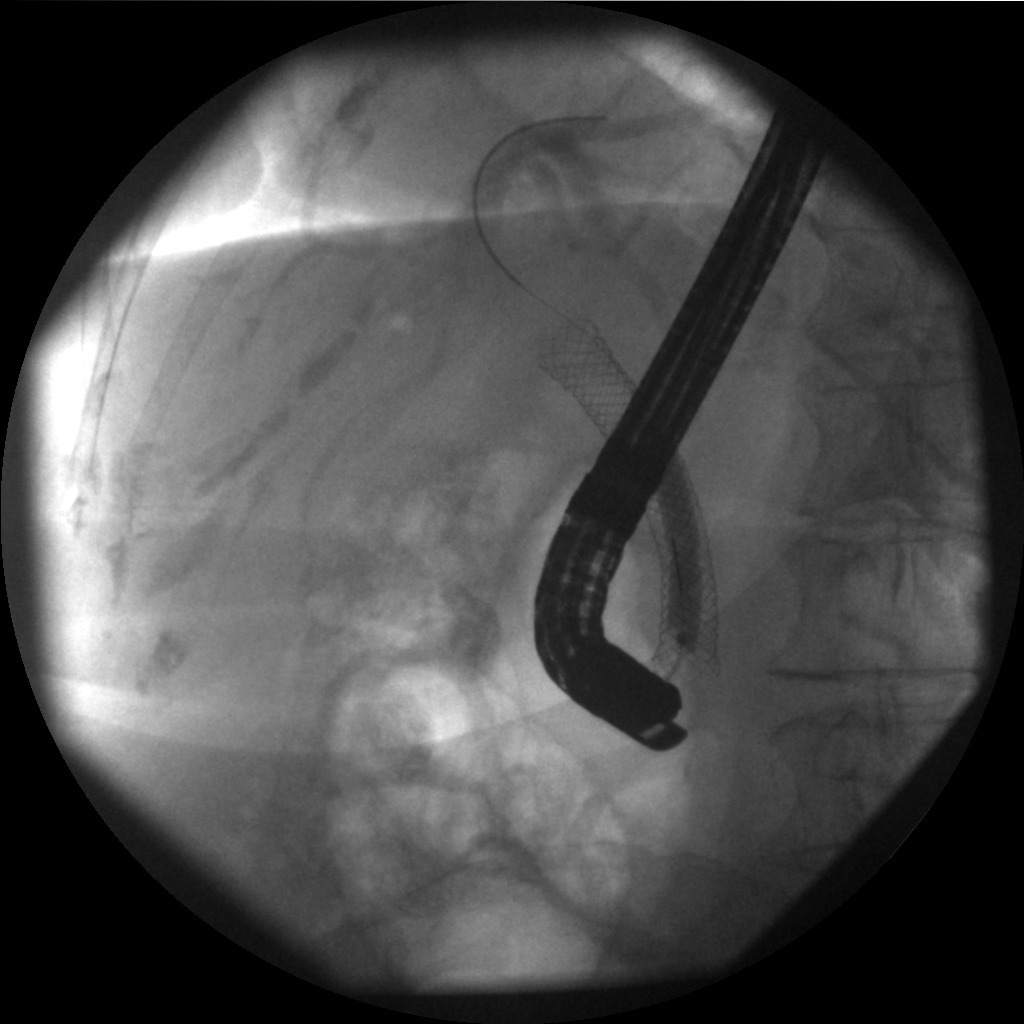

[Series 5: dr · 1 of 1 slices shown (3 of 4)]
[im 1/1]
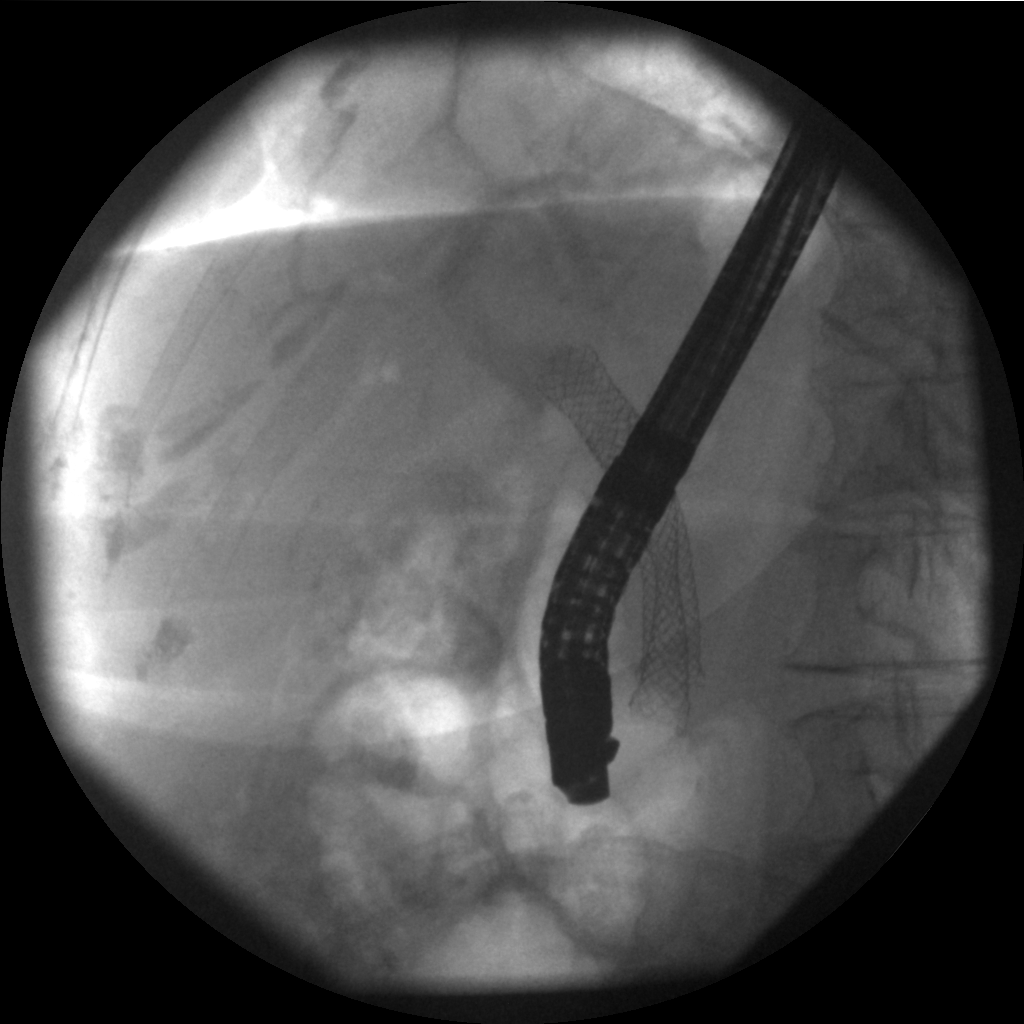

[Series 6: dr · 1 of 1 slices shown (4 of 4)]
[im 1/1]
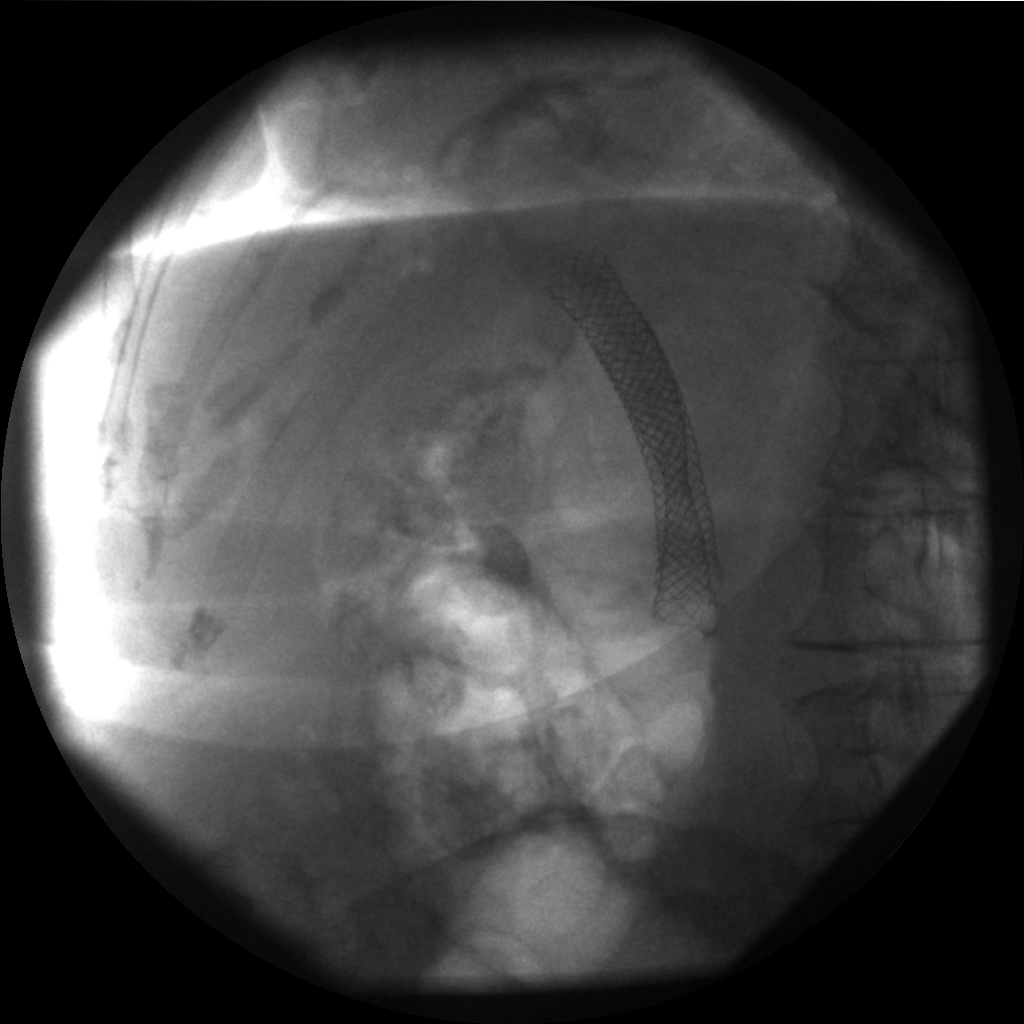

[5 of 5 positions shown; findings below may reference images not displayed]

FINDINGS: The patient already has a common bile duct metallic
stent.  The biliary system was cannulated with a wire.  Contrast
injection demonstrated filling defects within the biliary stent.
The biliary stent was dilated with a balloon.  A new metallic stent
was placed in the mid and distal aspect of the old stent and
appears to extend into the duodenum.
IMPRESSION: Filling defects identified in the distal common bile duct stent.
This area was treated with balloon dilatation and placement of a
new metallic stent.

These images were submitted for radiologic interpretation only.
Please see the procedural report for the amount of contrast and the
fluoroscopy time utilized.

## 2013-04-12 NOTE — Telephone Encounter (Signed)
Received voicemail from [place] at [facility] she said that they are moving pt to skilled nursing short term for her rib fracture and she is filing out her FL2 forms and had a few questions regarding her medication, called her back and she was not at her desk, left message requesting her to call office

## 2013-04-12 NOTE — Telephone Encounter (Signed)
Received call back from [facility]) and she wanted to know if pt is still taking pancrease, megace, and prednisone since she was cleared of having pancreatic cancer?

## 2013-04-12 NOTE — Telephone Encounter (Signed)
Noreene Larsson with [name] advise of Dr. Royden Purl comment and I advise to check with GI an oncologist docs

## 2013-04-12 NOTE — Telephone Encounter (Signed)
They are still on her med list so I am unsure - they may need to check with her family or her GI doctor or oncologist- since I was not managing that  thanks

## 2013-04-13 DIAGNOSIS — K219 Gastro-esophageal reflux disease without esophagitis: Secondary | ICD-10-CM

## 2013-04-13 DIAGNOSIS — F068 Other specified mental disorders due to known physiological condition: Secondary | ICD-10-CM | POA: Diagnosis not present

## 2013-04-13 DIAGNOSIS — J449 Chronic obstructive pulmonary disease, unspecified: Secondary | ICD-10-CM | POA: Diagnosis not present

## 2013-04-13 DIAGNOSIS — R079 Chest pain, unspecified: Secondary | ICD-10-CM | POA: Diagnosis not present

## 2013-04-15 ENCOUNTER — Telehealth: Payer: Self-pay

## 2013-04-15 NOTE — Telephone Encounter (Signed)
Alona Bene, pt's daughter left v/m requesting written prescription for walker, shower chair and 4 prong cane. Alona Bene will pick up prescription when ready.

## 2013-04-16 ENCOUNTER — Encounter: Payer: Self-pay | Admitting: Family Medicine

## 2013-04-16 DIAGNOSIS — N390 Urinary tract infection, site not specified: Secondary | ICD-10-CM | POA: Diagnosis not present

## 2013-04-16 DIAGNOSIS — Z7409 Other reduced mobility: Secondary | ICD-10-CM | POA: Insufficient documentation

## 2013-04-16 NOTE — Telephone Encounter (Signed)
Alona Bene notified orders are ready for pick-up

## 2013-04-16 NOTE — Telephone Encounter (Signed)
Done and in IN box 

## 2013-04-19 ENCOUNTER — Telehealth: Payer: Self-pay | Admitting: Internal Medicine

## 2013-04-19 NOTE — Telephone Encounter (Signed)
LMTCB at number given-patient was made aware of ONO results per 04-08-13 phone note; we also sent order for O2 Sleep to [facility].

## 2013-04-19 NOTE — Telephone Encounter (Signed)
Pt's daughter called back & wants the results today.  Antionette Fairy

## 2013-04-19 NOTE — Telephone Encounter (Signed)
Please advise on results on ONO thanks

## 2013-04-20 NOTE — Telephone Encounter (Signed)
Pt's daughter returned call.  Cherie Ouch [phone].  Antionette Fairy

## 2013-04-20 NOTE — Telephone Encounter (Signed)
I have provided [name]--pts daughter with this information as well and made her aware that o2 for sleep has been placed with [facility]  Alona Bene verbalized understanding and nothing further needed at this time

## 2013-04-21 ENCOUNTER — Telehealth: Payer: Self-pay | Admitting: Internal Medicine

## 2013-04-21 NOTE — Telephone Encounter (Signed)
Called [facility] needs order for 02 faxed to skilled nursing and [facility] will fax the order we gave them Tobe Sos

## 2013-04-21 NOTE — Telephone Encounter (Signed)
[facility] returned call from [place] re: O2. Bridget Walker

## 2013-04-22 ENCOUNTER — Encounter: Payer: Self-pay | Admitting: Internal Medicine

## 2013-04-23 ENCOUNTER — Telehealth: Payer: Self-pay | Admitting: Family Medicine

## 2013-04-23 NOTE — Telephone Encounter (Signed)
Discussed with Bonita Quin the nurse there We will get PT back involved but in very gentle way---there was concern that her injury occurred from therapy

## 2013-04-23 NOTE — Telephone Encounter (Signed)
Caller: [name]/Other; Phone: [phone]; Reason for Call: Patient is a resident at [facility] but is looking at placing her back in assisted living when discharged from skilled nursing.  Is not getting out of bed and daughter is wanting MD to writew order for her to have cold or warm therapy to rib injury that was seen for when placed in skilled nursing.  Would also like order for "minimal walking therapy".  Wants physical therapy to be prescribed for her so she does not lose functioning. Advised to have facility contact MD that covers facility/Dr. Alphonsus Sias for re evaluation of this injury for possible continuance of physical therapy. Daughter agrees.

## 2013-05-05 ENCOUNTER — Telehealth: Payer: Self-pay | Admitting: Family Medicine

## 2013-05-05 NOTE — Telephone Encounter (Signed)
Pt's daughter, Cherie Ouch dropped off a medical certification form for assisted living to be completed by Dr. Milinda Antis.  Ms. Levada Schilling says she needs these forms by Monday or Tuesday of next week.  I let her know Dr. Milinda Antis is out of the office this week.  The forms are on your desk. Thank you.

## 2013-05-05 NOTE — Telephone Encounter (Signed)
Form placed in you inbox to complete when you return 

## 2013-05-10 DIAGNOSIS — Z0279 Encounter for issue of other medical certificate: Secondary | ICD-10-CM

## 2013-05-10 NOTE — Telephone Encounter (Signed)
Pt's daughter answered the questions that we needed answered and I advised pt form ready for pick-up and advised pt's daughter of the $10 fee

## 2013-05-10 NOTE — Telephone Encounter (Signed)
Done and in IN box- family may need to supplement some of the answers - re: 24 hour care ? I put down that she cannot take own meds- change that if necessary, thanks

## 2013-05-17 ENCOUNTER — Encounter: Payer: Self-pay | Admitting: Internal Medicine

## 2013-05-17 ENCOUNTER — Ambulatory Visit (INDEPENDENT_AMBULATORY_CARE_PROVIDER_SITE_OTHER): Payer: Medicare Other | Admitting: Internal Medicine

## 2013-05-17 VITALS — BP 112/70 | HR 91 | Ht 60.0 in | Wt 113.4 lb

## 2013-05-17 DIAGNOSIS — J438 Other emphysema: Secondary | ICD-10-CM | POA: Diagnosis not present

## 2013-05-17 DIAGNOSIS — J439 Emphysema, unspecified: Secondary | ICD-10-CM

## 2013-05-17 MED ORDER — PREDNISONE 5 MG PO TABS: ORAL_TABLET | ORAL | Status: DC

## 2013-05-17 NOTE — Patient Instructions (Addendum)
We can reduce prednisone to 5 mg  Every  Other Day until next visit here  Please call as needed  Order- future CXR to be done on arrival, next visit     Dx COPD, lung nodule  Script for prednisoneshe

## 2013-05-17 NOTE — Progress Notes (Signed)
06/08/12- 20 yoF former smoker  referred by Dr Benay Spice. Pt states having Increase SOB upon activity Productive cough in am . PCP Dr Loura Pardon    Son here She is being monitored for possible pancreatic tumor and has history of a biliary stent. For about 3 months she has been aware of dyspnea on exertion while walking, relieved by sitting. She had a "mild outpatient pneumonia" treated 2 weeks ago. She had been exercising at the [facility]. She was noting more shortness of breath with exertion after bad cold and some shingles. Weight had been going down. She has been drinking diet supplements to gain weight. She denies cough, wheeze, chest pain. She does hear a rattle but coughs out nothing. Legs feel weak. She denies fever, blood or swollen glands. She denies past history of asthma or pneumonia and she denies heart disease, reflux, choking with meals. She is widowed, living in a retirement center. 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 Father died with pneumonia, mother died with MI. CT chest was concerning for density, more than a "mild pneumonia". CT chest 05/07/12- reviewed with her IMPRESSION:  No evidence of significant pulmonary embolus. The extensive  consolidation of the right lower lung and right middle lung with  patchy areas of nodular infiltration in the left lower lung.  Changes may represent pneumonia. No definite evidence of a central  obstructing lesion although the distal right main stem bronchus is  somewhat narrowed and a cold mass may be obscured by the  consolidation. Recommend follow up after resolution of acute  symptoms. Large esophageal hiatal hernia with mild esophageal  dilatation. Esophagitis versus dysmotility. Biliary wall stent  and biliary gas demonstrated in the upper abdomen.  Original Report Authenticated By: Neale Burly, M.D.   CXR 06/03/12- IMPRESSION:  1. Multi focal  right lung airspace disease has not significantly  changed since 05/07/2012.  2. Interval increased patchy opacity at the left lung base.  Original Report Authenticated By: Randall An, M.D.   07/07/12- 56 yoF former smoker followed for pneumonia complicated by large hiatal hernia, pancreatic cancer. Dr Sherrill/ Onc> Hospice. Daughter and son here today. She is now followed by hospice because of pancreatic cancer with biliary stent. Notices dyspnea standing, and cough as she gets out of bed and starts moving. Mostly clear phlegm, occasional scant green. Wakes herself coughing. Avelox did not help. We reviewed the CT scan images again, nothing large hiatal hernia full of food and parenchymal consolidation consistent with pneumonia and especially consistent with aspiration pneumonia. Discussed possibility of organizing pneumonia.  07/27/12-  55 yoF former smoker followed for pneumonia complicated by large hiatal hernia, pancreatic cancer.  Dtr here. Sputum CX 06/10/12- Nl flora, neg for AFB. Dr Ardis Hughs has indicated steroid trial, aimed at organizing pneumonia, is ok from pancreas standpoint.  Very short of breath after shower. Home oxygen helps, provided by Hospice through? [facility]. No pain, fever, sweat. Scant clear mucus. Flutter device helps shortness of breath after use, but does not generate productive cough. She denies any choking or strangling with meals or during sleep. CXR I 07/27/12- images reviewed with them. IMPRESSION:  1. Worsening asymmetric airspace disease involving right lung  greater than left, suspicious for pneumonia.  2. Stable mild cardiomegaly.  3. Hiatal hernia again noted.  Original Report Authenticated By: Marlaine Hind, M.D.   08/13/12- 22 yoF former smoker followed for  pneumonia complicated by large hiatal hernia, pancreatic cancer.  Dtr here.  Pt reports legs no longer hurt, no more coughing in the morning, wearing O2 most of the time--not today. Has continued  maintenance prednisone 20 mg daily and recognizes breathing is better but it gives her some insomnia. Has elevated head of bed. Using oxygen at 2 L only as needed now. Not coughing at all. Wants flu vax CXR 08/13/12- IMPRESSION:  Improved right lower lobe and left lung base infiltrates.  Underlying COPD. No new abnormalities.   10/02/12- 63 yoF former smoker followed for pneumonia complicated by large hiatal hernia, pancreatic cancer.  Dtr here. Follow-Up  - has been feeling very anxious lately. Hospice nurse thinks that she needs something for anxiety. Prednisone seemed to reduce the amount of parenchymal density, consistent with impression that it was organizing pneumonia. She is followed with a hospice nurse. Reducing prednisone to 5 mg daily left her too short of breath, so  she went back to 10 mg daily. Less morning cough. Recent night sweats. Low back pain sometimes radiates to abdomen. CXR 10/02/12- we reviewed the images together IMPRESSION:  Improved airspace disease in the right mid and lower lung zones.  Persistent opacity is noted  Stable spiculated opacity at the left lung base. Underlying  malignancy cannot be excluded.  Original Report Authenticated By: Donavan Burnet, M.D.   03/22/13- 49 yoF former smoker followed for pneumonia complicated by large hiatal hernia.  Dtr here. FOLLOWS FOR: breathing doing good as long as wearing O2; daughter states that pt is still breathy; hospice is pulling out as pt does not have pancreatic cancer; will need order for O2 through DME company since hospice is no longer going to help with this.  Has been using prednisone 10 mg alternating with 5 mg every other day. Makes her anxious. Mentions pains and right rib cage that seem to come and go.  05/17/13- 77 yoF former smoker followed for Hx pneumonia, COPD/E,  complicated by large hiatal hernia.  Dtr here. FOLLOWS FOR: continues to use O2 QHS. Breathing is doing okay O2 2-4L/ [facility], just for  sleep and as needed, but she doesn't think she needs much. Moving to assisted living. Per the right side of her back exercising, helped by physical therapy Prednisone now reduced to 5 mg daily. CXR 03/23/13-Stable spiculated opacity at the left lung base. Large hiatal hernia. IMPRESSION:  No acute abnormality is noted.  Original Report Authenticated By: Alcide Clever, M.D  ROS-see HPI Constitutional:   No-  weight loss, night sweats, fevers, chills, fatigue, lassitude. HEENT:   No-  headaches, difficulty swallowing, tooth/dental problems, sore throat,       No-  sneezing, itching, ear ache, nasal congestion, post nasal drip,  CV:  No-   chest pain, orthopnea, PND, swelling in lower extremities, anasarca,dizziness, palpitations Resp: Less shortness of breath with exertion or at rest.              No- productive cough,  No non-productive cough,  No- coughing up of blood.              No-   change in color of mucus.  No- wheezing.   Skin: No-   rash or lesions. GI:  No-   heartburn, indigestion, abdominal pain, nausea, vomiting,  GU:  MS:  No-   joint pain or swelling.  +back pain Neuro-     nothing unusual Psych:  No- change in mood or affect.  depression  or +anxiety.  No memory loss.  OBJ- Physical Exam General- Alert, Oriented, Affect-appropriate, Distress- none acute, thin elderly woman Skin- rash-none, lesions- none, excoriation- none Lymphadenopathy- none Head- atraumatic            Eyes- Gross vision intact, PERRLA, conjunctivae and secretions clear            Ears- Hearing, canals-normal            Nose- Clear, no-Septal dev, mucus, polyps, erosion, perforation             Throat- Mallampati II , mucosa clear , drainage- none, tonsils- atrophic. Own teeth Neck- flexible , trachea midline, no stridor , thyroid nl, carotid no bruit Chest - symmetrical excursion , unlabored           Heart/CV- RRR/few extra beats , no murmur , no gallop  , no rub, nl s1 s2                            - JVD- none , edema- none, stasis changes- none, varices- none           Lung- . Clear, rub- none, no cough or wheeze           Chest wall-  Abd-  Br/ Gen/ Rectal- Not done, not indicated Extrem- cyanosis- none, clubbing, none, atrophy- none, strength- nl, using a walker Neuro- grossly intact to observation

## 2013-05-24 ENCOUNTER — Telehealth: Payer: Self-pay | Admitting: Internal Medicine

## 2013-05-24 NOTE — Telephone Encounter (Signed)
Pt daughter needed to verify which DME pt gets O2 from>> looks as though [facility] has been covering. Daughter trying to contact DME to make sure that when pt moves facilities that her O2 is covered. Daughter to call [facility] to verify and to call us if anything further needed.

## 2013-05-25 ENCOUNTER — Other Ambulatory Visit: Payer: Self-pay | Admitting: Oncology

## 2013-05-25 DIAGNOSIS — C259 Malignant neoplasm of pancreas, unspecified: Secondary | ICD-10-CM

## 2013-05-26 ENCOUNTER — Other Ambulatory Visit: Payer: Self-pay | Admitting: Oncology

## 2013-05-27 ENCOUNTER — Other Ambulatory Visit: Payer: Self-pay

## 2013-05-27 DIAGNOSIS — C259 Malignant neoplasm of pancreas, unspecified: Secondary | ICD-10-CM

## 2013-05-27 NOTE — Telephone Encounter (Addendum)
Bridget Walker pts daughter left v/m pt has moved into assisted living at [facility]; Pt is coming to office to see Dr Milinda Antis now and needs written prescriptions for all pt's meds for 90 days with 3 refills to go to [facility] fax [phone]. Pt also needs Alprazolam and megestrol sent to [facility] for 30 day prescriptions. Bridget Walker request cb when done. Bridget Walker request pt's Tramadol changed to twice daily and not prn due to back pain. Pancrease MT instructions need to be changed to 2 capsules three times a day with meals.Bridget Walker not positive how often pt takes Vit D 50000. Pt also does not need prednisone refilled. Bridget Walker request cb when done.

## 2013-05-28 MED ORDER — MELOXICAM 7.5 MG PO TABS: 7.5000 mg | ORAL_TABLET | Freq: Every day | ORAL | Status: DC

## 2013-05-28 MED ORDER — ALPRAZOLAM 0.25 MG PO TABS: ORAL_TABLET | ORAL | Status: DC

## 2013-05-28 MED ORDER — MEGESTROL ACETATE 40 MG/ML PO SUSP: 400.0000 mg | Freq: Every day | ORAL | Status: DC

## 2013-05-28 MED ORDER — VITAMIN D (ERGOCALCIFEROL) 1.25 MG (50000 UNIT) PO CAPS: 50000.0000 [IU] | ORAL_CAPSULE | ORAL | Status: DC

## 2013-05-28 MED ORDER — ESOMEPRAZOLE MAGNESIUM 40 MG PO CPDR: DELAYED_RELEASE_CAPSULE | ORAL | Status: DC

## 2013-05-28 MED ORDER — ALPRAZOLAM 0.25 MG PO TABS: 0.2500 mg | ORAL_TABLET | Freq: Three times a day (TID) | ORAL | Status: DC | PRN

## 2013-05-28 MED ORDER — AMYLASE-LIPASE-PROTEASE 12-4-12 MU PO CPEP: 2.0000 | ORAL_CAPSULE | Freq: Three times a day (TID) | ORAL | Status: DC

## 2013-05-28 MED ORDER — TRAMADOL HCL 50 MG PO TABS: 50.0000 mg | ORAL_TABLET | Freq: Two times a day (BID) | ORAL | Status: DC | PRN

## 2013-05-28 MED ORDER — VITAMIN D (ERGOCALCIFEROL) 1.25 MG (50000 UNIT) PO CAPS: ORAL_CAPSULE | ORAL | Status: DC

## 2013-05-28 NOTE — Telephone Encounter (Signed)
Scripts faxed to fax number provided in note below.

## 2013-05-28 NOTE — Telephone Encounter (Signed)
Printed scripts are on your desk for signature, [facility] day supply [facility]am [facility]

## 2013-05-29 ENCOUNTER — Encounter: Payer: Self-pay | Admitting: Internal Medicine

## 2013-05-29 NOTE — Assessment & Plan Note (Signed)
Chest x-ray does not suggest active disease. We discussed the hiatal hernia as a risk for aspiration. We will keep watch on areas of scarring in the left lower lobe. Plan-steroid talk. Reduce prednisone to 5 mg every other day.

## 2013-06-08 ENCOUNTER — Encounter: Payer: Self-pay | Admitting: Radiology

## 2013-06-09 ENCOUNTER — Telehealth: Payer: Self-pay

## 2013-06-09 ENCOUNTER — Encounter: Payer: Self-pay | Admitting: Family Medicine

## 2013-06-09 ENCOUNTER — Ambulatory Visit (INDEPENDENT_AMBULATORY_CARE_PROVIDER_SITE_OTHER)
Admission: RE | Admit: 2013-06-09 | Discharge: 2013-06-09 | Disposition: A | Discharge status: Home / Self Care | Payer: Medicare Other | Source: Ambulatory Visit | Attending: Family Medicine | Admitting: Family Medicine

## 2013-06-09 ENCOUNTER — Ambulatory Visit (INDEPENDENT_AMBULATORY_CARE_PROVIDER_SITE_OTHER): Payer: Medicare Other | Admitting: Family Medicine

## 2013-06-09 VITALS — BP 104/66 | HR 96 | Temp 98.2°F | Ht 60.0 in | Wt 111.2 lb

## 2013-06-09 DIAGNOSIS — M545 Low back pain, unspecified: Secondary | ICD-10-CM

## 2013-06-09 DIAGNOSIS — S22009A Unspecified fracture of unspecified thoracic vertebra, initial encounter for closed fracture: Secondary | ICD-10-CM | POA: Diagnosis not present

## 2013-06-09 DIAGNOSIS — S32009A Unspecified fracture of unspecified lumbar vertebra, initial encounter for closed fracture: Secondary | ICD-10-CM | POA: Diagnosis not present

## 2013-06-09 DIAGNOSIS — Z7409 Other reduced mobility: Secondary | ICD-10-CM

## 2013-06-09 DIAGNOSIS — M546 Pain in thoracic spine: Secondary | ICD-10-CM

## 2013-06-09 DIAGNOSIS — R69 Illness, unspecified: Secondary | ICD-10-CM

## 2013-06-09 LAB — POCT URINALYSIS DIPSTICK
Blood, UA: NEGATIVE
Glucose, UA: NEGATIVE
Nitrite, UA: NEGATIVE
Spec Grav, UA: >=1.03
pH, UA: 6

## 2013-06-09 NOTE — Telephone Encounter (Signed)
Please ask pt how often she needs it and I will change order accordingly

## 2013-06-09 NOTE — Assessment & Plan Note (Signed)
In pt with hx of OP/ kyphosis and also T10 comp fx in past  xrays today LS and TS Tramadol prn

## 2013-06-09 NOTE — Telephone Encounter (Signed)
Albin Felling with [facility] left v/m requesting clarification of instructions for Tramadol to be faxed to [phone]. Pt was admitted to [facility] from health care and pt was ordered Tramadol 50 mg q6h. Pt saw Dr Milinda Antis today and Tramadol order reads take twice a day.Please fax clarification of Tramadol to [phone].

## 2013-06-09 NOTE — Patient Instructions (Addendum)
xrays of low and mid back today Try to get 1200-1500 mg of calcium per day with at least 1000 iu of vitamin D - for bone health  Continue tramadol as needed

## 2013-06-09 NOTE — Progress Notes (Signed)
Subjective:    Patient ID: Bridget Walker, female    DOB: Oct 21, 1925, 79 y.o.   MRN: [id number]  HPI Here with low back pain  Now lives in a facility - assisted living - help with getting medicines    ua is nl except for bili today (has hx of biliary stent)  Low back pain on and off - goes from R to L side  This started after using an exercise bike for an assessment for a cane  Saw Dr Patsy Lager- thought she may have a strain or muscle/ rib issue  She rested   Has hx of T10 compression fracture   She did take a pain today  Was hurting upper lumbar area and radiates up  Pain is the worst when she sits up from lying down (eases off when she does lie down) No trauma  No rad to leg  No numbness or weakness   Patient Active Problem List   Diagnosis Date Noted  . Compression fracture of spine 06/10/2013  . Back pain of thoracolumbar region 06/09/2013  . Mobility impaired 04/16/2013  . Frequent PVCs 03/28/2013  . COPD with emphysema 03/28/2013  . Lung nodule, L lower lobe 10/15/2012  . Lobar pneumonia due to unspecified organism 06/11/2012  . Abnormal CT scan, gastrointestinal tract 03/24/2012  . Nonspecific (abnormal) findings on radiological and other examination of biliary tract 03/24/2012  . Weight loss 03/18/2012  . Fatigue 03/18/2012  . Jaundice 03/18/2012  . Loss of appetite 03/18/2012  . Anemia 11/29/2011  . TINNITUS 08/09/2010  . OVERACTIVE BLADDER 03/14/2010  . GERD 11/06/2009  . UNSPECIFIED VITAMIN D DEFICIENCY 10/31/2008  . DIVERTICULOSIS OF COLON 12/25/2007  . RESTLESS LEG SYNDROME 10/26/2007  . COLONIC POLYPS, HX OF 10/26/2007  . HYPERCHOLESTEROLEMIA 09/23/2007  . OSTEOARTHRITIS 09/23/2007  . OSTEOPOROSIS 09/23/2007   Past Medical History  Diagnosis Date  . Osteoporosis   . Hyperlipidemia   . RLS (restless legs syndrome)   . Vitamin D deficiency   . GERD (gastroesophageal reflux disease)   . Cerumen impaction     recurrent  . Unexplained weight loss   .  Cough   . Constipation   . Diarrhea   . Shingles   . Overactive bladder   . Cancer     pancreatic  . Arthritis     OA, in hands   Past Surgical History  Procedure Laterality Date  . Cataract extraction      patient unsure of date procedure was done.  . Cataract extraction  11/08    2nd cataract  . Ercp  03/24/2012    Procedure: ENDOSCOPIC RETROGRADE CHOLANGIOPANCREATOGRAPHY (ERCP);  Surgeon: Rachael Fee, MD;  Location: [facility];  Service: Endoscopy;  Laterality: N/A;  . Eus  04/02/2012    Procedure: ESOPHAGEAL ENDOSCOPIC ULTRASOUND (EUS) RADIAL;  Surgeon: Rachael Fee, MD;  Location: [facility];  Service: Endoscopy;  Laterality: N/A;  EUS with FNA  . Fine needle aspiration  04/02/2012    Procedure: FINE NEEDLE ASPIRATION (FNA) LINEAR;  Surgeon: Rachael Fee, MD;  Location: [facility];  Service: Endoscopy;  Laterality: N/A;  . Endoscopic retrograde cholangiopancreatography (ercp) with propofol N/A 01/28/2013    Procedure: ENDOSCOPIC RETROGRADE CHOLANGIOPANCREATOGRAPHY (ERCP) WITH PROPOFOL;  Surgeon: Rachael Fee, MD;  Location: [facility];  Service: Endoscopy;  Laterality: N/A;   History  Substance Use Topics  . Smoking status: Former Smoker -- 35 years    Types: Cigarettes    Quit date: 03/23/1992  .  Smokeless tobacco: Never Used     Comment: PT SMOKED 2 CIGARETTES A DAY   . Alcohol Use: No   Family History  Problem Relation Age of Onset  . Diabetes Sister   . Hypertension Sister   . Heart disease Brother   . Fibromyalgia Daughter   . Alcohol abuse Son   . Heart disease Brother   . Diabetes Brother   . Heart disease Mother   . Pneumonia Father   . Cancer Other     niece had breast and bone cancer  . Heart failure Father    Allergies  Allergen Reactions  . Lactose Intolerance (Gi)     Pt can drink skim milk  . Hydrocodone Rash    Patient unsure of reaction, but it may have been diarrhea.  . Penicillins Rash    On scalp   Current Outpatient  Prescriptions on File Prior to Visit  Medication Sig Dispense Refill  . ALPRAZolam (XANAX) 0.25 MG tablet TAKE 1 TABLET BY MOUTH 3 TIMES A DAY AS NEEDED FOR ANXIETY  270 tablet  1  . ALPRAZolam (XANAX) 0.25 MG tablet Take 1 tablet (0.25 mg total) by mouth 3 (three) times daily as needed for sleep.  90 tablet  0  . ALPRAZolam (XANAX) 0.25 MG tablet TAKE 1 TABLET BY MOUTH 3 TIMES A DAY AS NEEDED FOR ANXIETY  270 tablet  0  . amylase-lipase-protease (PANCREASE MT 4) 11-12-11 MU per capsule Take 2 capsules by mouth 3 (three) times daily with meals.  540 capsule  0  . esomeprazole (NEXIUM) 40 MG capsule TAKE 1 CAPSULE (40 MG TOTAL) BY MOUTH DAILY BEFORE BREAKFAST.  90 capsule  3  . esomeprazole (NEXIUM) 40 MG capsule TAKE 1 CAPSULE (40 MG TOTAL) BY MOUTH DAILY BEFORE BREAKFAST.  90 capsule  3  . megestrol (MEGACE) 40 MG/ML suspension Take 10 mLs (400 mg total) by mouth daily.  240 mL  0  . megestrol (MEGACE) 40 MG/ML suspension Take 10 mLs (400 mg total) by mouth daily.  240 mL  0  . megestrol (MEGACE) 40 MG/ML suspension Take 10 mLs (400 mg total) by mouth daily.  240 mL  0  . meloxicam (MOBIC) 7.5 MG tablet Take 1 tablet (7.5 mg total) by mouth daily.  90 tablet  0  . meloxicam (MOBIC) 7.5 MG tablet Take 1 tablet (7.5 mg total) by mouth daily.  90 tablet  0  . polyethylene glycol (MIRALAX / GLYCOLAX) packet Take 17 g by mouth daily as needed (for constipation).      . predniSONE (DELTASONE) 5 MG tablet 1 every other day  20 tablet  3  . traMADol (ULTRAM) 50 MG tablet Take 1 tablet (50 mg total) by mouth 2 (two) times daily as needed for pain.  60 tablet  0  . traMADol (ULTRAM) 50 MG tablet Take 1 tablet (50 mg total) by mouth 2 (two) times daily as needed for pain.  180 tablet  0  . Vitamin D, Ergocalciferol, (DRISDOL) 50000 UNITS CAPS Take 1 capsule (50,000 Units total) by mouth every 7 (seven) days.  30 capsule  0  . Vitamin D, Ergocalciferol, (DRISDOL) 50000 UNITS CAPS Take one capsule by mouth  every 7 days.  30 capsule  0   No current facility-administered medications on file prior to visit.    Review of Systems Review of Systems  Constitutional: Negative for fever, appetite change, fatigue and unexpected weight change.  Eyes: Negative for pain and visual disturbance.  Respiratory: Negative for cough and shortness of breath.   Cardiovascular: Negative for cp or palpitations    Gastrointestinal: Negative for nausea, diarrhea and constipation.  Genitourinary: Negative for urgency and frequency.  Skin: Negative for pallor or rash   MSK pos for back pain , neg for joint swelling  Neurological: Negative for weakness, light-headedness, numbness and headaches.  Hematological: Negative for adenopathy. Does not bruise/bleed easily.  Psychiatric/Behavioral: Negative for dysphoric mood. The patient is not nervous/anxious.         Objective:   Physical Exam  Constitutional: She appears well-developed and well-nourished. No distress.  Frail appearing elderly female   HENT:  Head: Normocephalic and atraumatic.  Mouth/Throat: Oropharynx is clear and moist.  Eyes: Conjunctivae and EOM are normal. Pupils are equal, round, and reactive to light. Right eye exhibits no discharge. Left eye exhibits no discharge. No scleral icterus.  Neck: Normal range of motion. Neck supple. No JVD present. Carotid bruit is not present. No thyromegaly present.  Cardiovascular: Normal rate and regular rhythm.   Pulmonary/Chest: Effort normal and breath sounds normal.  Musculoskeletal: She exhibits tenderness. She exhibits no edema.       Thoracic back: She exhibits decreased range of motion, tenderness, bony tenderness and spasm. She exhibits no edema.       Lumbar back: She exhibits decreased range of motion, tenderness, bony tenderness and spasm. She exhibits no edema.  Kyphosis noted  Limited rom of spine  No acute joint changes   Tenderness over low thoracic and upper lumbar spinous processes    Lymphadenopathy:    She has no cervical adenopathy.  Neurological: She is alert. She has normal reflexes.  Skin: Skin is warm and dry. No rash noted.  Psychiatric: She has a normal mood and affect.          Assessment & Plan:

## 2013-06-09 NOTE — Telephone Encounter (Signed)
Left voicemail on Bridget Walker's phone asking how much does pt take on average a day

## 2013-06-10 ENCOUNTER — Telehealth: Payer: Self-pay | Admitting: Family Medicine

## 2013-06-10 DIAGNOSIS — M4850XA Collapsed vertebra, not elsewhere classified, site unspecified, initial encounter for fracture: Secondary | ICD-10-CM | POA: Insufficient documentation

## 2013-06-10 DIAGNOSIS — M545 Low back pain: Secondary | ICD-10-CM

## 2013-06-10 NOTE — Telephone Encounter (Signed)
Message copied by Judy Pimple on Thu Jun 10, 2013 11:50 AM ------      Message from: Shon Millet      Created: Thu Jun 10, 2013 11:07 AM       Pt and pt's daughter Alona Bene notified of Xray results and Dr. Royden Purl recommendations, daughter agrees with referral, I advised that Marion/Linda will call to get appt set up ------

## 2013-06-14 DIAGNOSIS — Z79899 Other long term (current) drug therapy: Secondary | ICD-10-CM | POA: Diagnosis not present

## 2013-06-22 ENCOUNTER — Encounter: Payer: Self-pay | Admitting: Family Medicine

## 2013-06-22 ENCOUNTER — Telehealth: Payer: Self-pay

## 2013-06-22 NOTE — Assessment & Plan Note (Signed)
At this time , Bridget Walker requires a walker with seat to get to the bathroom, perform basic grooming, and to get to the kitchen to eat her meals -- all without falling

## 2013-06-22 NOTE — Telephone Encounter (Signed)
I attached the office note they needed and in the IN box

## 2013-06-22 NOTE — Telephone Encounter (Signed)
Bridget Walker left v/m; pt is at assisted living [facility] and Bridget Walker thinks pt is taking a lot of med in the early morning rather than spacing med out during the day and pt is getting nauseated. Bridget Walker request if meds could be spaced out through out the day.Bridget Walker request Megestrol to help pt to gain weight; Bridget Walker thinks  [address] still giving [facility].Bridget Walker request cb.

## 2013-06-22 NOTE — Telephone Encounter (Signed)
OV note faxed and Cherie Ouch notified

## 2013-06-22 NOTE — Telephone Encounter (Signed)
Left Bridget Walker a 2nd voicemail asking her on average how much is pt taking Tramadol so we can change her order if we need to

## 2013-06-22 NOTE — Telephone Encounter (Signed)
Bridget Walker left v/m checking on status of fax request from Beau Fanny with [facility] medical faxing a note for authorization to file with [employer] medicare for walker and shower chair verifying diagnosis for pt's need.Please advise.Alona Bene request cb.

## 2013-06-22 NOTE — Telephone Encounter (Signed)
She could space out her medicines I prefer nexium in am - it works better as am med  If she takes meloxicam- always give with food , also ultram- may do better with food when she needs it  The pancrease is with meals The xanax is as needed -so that depends on symptoms  I think the megace could be given at night  Let me know what they want to do

## 2013-06-23 ENCOUNTER — Ambulatory Visit: Admitting: Family Medicine

## 2013-06-23 NOTE — Telephone Encounter (Signed)
I wrote order for tramadol to remain bid  For meloxicam and megace to be given at night with a meal Let me know if nausea persists Order in IN box

## 2013-06-23 NOTE — Telephone Encounter (Signed)
Spoke with pt's daughter and she would like to only give pt tramadol BID, one tab in am and 1 tab in pm and if any pain in between, is it okay for pt to take tylenol, see other phone note also on pt

## 2013-06-23 NOTE — Telephone Encounter (Signed)
Orders faxed and pt's daughter notified, copy of orders mailed to pt's daughter per her request

## 2013-06-23 NOTE — Telephone Encounter (Signed)
See prev not-keep it at bid

## 2013-06-23 NOTE — Telephone Encounter (Signed)
Spoke with pt's daughter and they are giving her all of her meds 1st thing in the morning before breakfast and it is really upsetting her stomach, pt does agree with your recommendations and would like a order faxed to [facility] changing when we give pt her meds, daughter also wants to know if it's okay to only give pt ultram 1 tab in the am and one tab in the pm and then if pt is having any pain during the day to give her tylenol if that is okay please put it in orders  And Fax to [facility] at fax # [phone]

## 2013-06-25 NOTE — Telephone Encounter (Signed)
Pt's Daughter Alona Bene said spreading out pt's meds is helping her nausea a lot. There is one change pt's daughter is requesting.  On the last order faxed you put that pt is to take her tramadol BID prn, pt is taking her tramadol everyday (BID). Since you put the order as BID prn, [name] is only giving her the tramadol when she calls an request it. The problem is sometimes it can take up to an hour before pt gets her medication. Pt's daughter request that you change her order of tramadol to just BID (one tab in am an one tab in pm) so that it's already in her medicine box and they give it to her on a schedule with out her having to call a nurse and request it and then have to wait until they can bring it to her. Alona Bene would liked this changed until they can get her Ortho problems taken care of when she sees the orthopedic   Alona Bene would like new order faxed to [facility] fax # in prev. note

## 2013-06-25 NOTE — Telephone Encounter (Signed)
No problem-I put order in In box-please change directions in her med list

## 2013-06-25 NOTE — Addendum Note (Signed)
Addended by: Shon Millet on: 06/25/2013 04:56 PM   Modules accepted: Orders

## 2013-06-25 NOTE — Telephone Encounter (Signed)
Orders faxed, a copy mailed to her daughter (per her request), and med list updated

## 2013-06-30 ENCOUNTER — Telehealth: Payer: Self-pay

## 2013-06-30 NOTE — Telephone Encounter (Signed)
I put order in IN box to fax

## 2013-06-30 NOTE — Telephone Encounter (Signed)
Bridget Walker at assisted living at [facility] needs written order for Miralax faxed to [facility] attention fax # [phone]; is changing pharmacy.Please advise.

## 2013-07-01 MED ORDER — POLYETHYLENE GLYCOL 3350 17 GM/SCOOP PO POWD: 17.0000 g | Freq: Every day | ORAL | Status: DC | PRN

## 2013-07-01 NOTE — Addendum Note (Signed)
Addended by: Shon Millet on: 07/01/2013 12:20 PM   Modules accepted: Orders

## 2013-07-01 NOTE — Telephone Encounter (Addendum)
Pt's daughter called back and advise [place] that she didn't need the order sent into [name], she needs a Rx for Peg 3350 powder, 5527g sent to [facility] because pt wouldn't have a co-pay, I advise pt's daughter that I already faxed order to [facility] for [place] and pt's daughter said she will call them and have them cancel that order since it would be free through [place], Rx sent electronically to [facility] and pt called and canceled order sent to [facility]

## 2013-07-01 NOTE — Telephone Encounter (Signed)
Orders Faxed

## 2013-07-06 DIAGNOSIS — T148XXA Other injury of unspecified body region, initial encounter: Secondary | ICD-10-CM | POA: Diagnosis not present

## 2013-07-19 ENCOUNTER — Telehealth: Payer: Self-pay | Admitting: Internal Medicine

## 2013-07-19 NOTE — Telephone Encounter (Signed)
Called spoke with pt dtr Alona Bene who reports that pt has been having increased SOB x1 with any activity and some wheezing.  Pt denies any chest tightness, cough, congestion, edema, fever.  Alona Bene would like to know if pt's prednisone needs to be increased.  Has upcoming appt w/ CDY 9.11.14.  Dr Maple Hudson please advise, thank you.  Per last ov w/ CDY on 6.9.14: Patient Instructions    We can reduce prednisone to 5 mg Every Other Day until next visit here  Please call as needed  Order- future CXR to be done on arrival, next visit Dx COPD, lung nodule  Script for prednisoneshe    [facility] Allergies  Allergen Reactions  . Lactose Intolerance (Gi)     Pt can drink skim milk  . Hydrocodone Rash    Patient unsure of reaction, but it may have been diarrhea.  . Penicillins Rash    On scalp

## 2013-07-19 NOTE — Telephone Encounter (Signed)
Called spoke with Alona Bene, advised of CY's recs to increase pt's prednisone to 10mg  daily until 9.11.14 ov.  Alona Bene okay with these recommendations and verbalized her understanding.  Alona Bene was unsure of the fax number to send the order to (pt stays in an independent living facility >> [facility] in [place]) and advised to call pt's nurse [place] at [phone].  Med list updated.  Called the main number for [facility] but the receptionist was unable to provide a fax number.  LMOM TCB x1 for [name].

## 2013-07-19 NOTE — Telephone Encounter (Signed)
Pt's daughter, Alona Bene,  returned call.  Please call at [phone].  Antionette Fairy

## 2013-07-19 NOTE — Telephone Encounter (Signed)
ATC patients daughter at # provided No answer LMOMTCB

## 2013-07-19 NOTE — Telephone Encounter (Signed)
Returning call can be reached at [phone].Bridget Walker

## 2013-07-19 NOTE — Telephone Encounter (Signed)
Recommend we go back to prednisone 10 mg daily till next ov. May need refill to have enough.

## 2013-07-19 NOTE — Telephone Encounter (Signed)
LMOM TCB x1 for Bridget Walker Last ov 6.9.14 w/ CY

## 2013-07-20 ENCOUNTER — Telehealth: Payer: Self-pay | Admitting: Internal Medicine

## 2013-07-20 NOTE — Telephone Encounter (Signed)
Noreene Larsson from [facility] returned call stating she is Chemical engineer.  She stated their fax # is [phone]. Leanora Ivanoff

## 2013-07-20 NOTE — Telephone Encounter (Signed)
I have faxed this over and made [facility] aware. Nothing further needed

## 2013-07-20 NOTE — Telephone Encounter (Signed)
Called spoke with [facility] who stated she wanted to clarify that pt's prednisone is being increased from 5mg  QOD to 10mg  QD.  Advised [name] that yes, we are aware and that pt's daughter had called yesterday with reports that pt has been having some increased DOE.  Angelica Chessman reported that both patient and daughter seem to be anxious and that she has not noticed any increased exertional dyspnea.  Angelica Chessman is concerned that increasing pt's prednisone may "send [pt] into a tailspin."  Advised [name] that pt does have an uncomping appt with CDY on 9.11.14.  Angelica Chessman stated that she will speak with pt and dtr as well.  Nothing further needed at this time per [facility].  Will sign off.

## 2013-08-02 ENCOUNTER — Telehealth: Payer: Self-pay | Admitting: Family Medicine

## 2013-08-02 ENCOUNTER — Telehealth: Payer: Self-pay | Admitting: *Deleted

## 2013-08-02 NOTE — Telephone Encounter (Signed)
Copy mailed to pt's daughter

## 2013-08-02 NOTE — Telephone Encounter (Signed)
Pt's daughter left voicemail. Pt is at [facility] and right now they are giving her the tramadol at 11:30 am and 4:30 pm. Daughter said from 4:30 pm until her next dose at 11:30 am is a long time and pt is in a lot of pain. Pt is using the tylenol in between but due to the long stretch pt still has pain. Daughter would like you to write a new order saying to give pt her tramadol in the morning with breakfast and then at bedtime, and in between she can use her tylenol that way her tramadol is a little more spaced out, please advise

## 2013-08-02 NOTE — Telephone Encounter (Signed)
Orders faxed and I left voicemail letting pt's daughter know orders have been faxed

## 2013-08-02 NOTE — Telephone Encounter (Signed)
I wrote order for tramadol w/ breakfast and at bedtime , and then she can take 1-2  (325 mg) tylenol up to every 4 hours  In IN box

## 2013-08-02 NOTE — Telephone Encounter (Signed)
Pt's daughter, Cherie Ouch, would like a copy of the fax that [place] sent to [facility] this morning so that she can have it on file as her POA.  Her address is:  [address] [place], [place] [address]

## 2013-08-02 NOTE — Telephone Encounter (Signed)
[name] called from [facility] and said pt is taking tylenol 500mg  so we need to change order from 1-2 tab q4 hr to 1-2 tab q6hr, Dr. Janace Hoard it and I refaxed over orders, med list updated with correct dose of tylenol

## 2013-08-06 ENCOUNTER — Telehealth: Payer: Self-pay | Admitting: *Deleted

## 2013-08-06 NOTE — Telephone Encounter (Signed)
Pt's daughter Bridget Walker) called because pt's pharmacy advise them that pt's pancrease is on back order and they don't know when they will get some more. They advise Alona Bene that an alternative would be creon. If that's okay with you please send in new Rx for creon to [facility] pharmacy said you may want to order a few months sine they don't know when they will have more

## 2013-08-06 NOTE — Telephone Encounter (Signed)
I looked that up and dosing is confusing - would the pharmacist please adv me - re: dosing that would be closest to the med she was taking previously? thanks

## 2013-08-10 ENCOUNTER — Telehealth: Payer: Self-pay | Admitting: Family Medicine

## 2013-08-10 NOTE — Telephone Encounter (Signed)
Px called in as prescribed, and left voicemail letting pt's daughter Alona Bene know Rx sent to pharmacy

## 2013-08-10 NOTE — Telephone Encounter (Signed)
pharmacist said Creon 3000 is the closest to the dose of the pancrease she is on, please advise

## 2013-08-10 NOTE — Telephone Encounter (Signed)
[facility] Triage Call Report Triage Record Num: [id number] Operator: Joni Reining Deal Patient Name: Bridget Walker Call Date & Time: 08/07/2013 10:26:18PM Patient Phone: [phone] PCP: Idamae Schuller A. [place] Patient Gender: Female PCP Fax : Patient DOB: 1925/11/15 Practice Name:  [facility] Reason for Call: Caller: Joy/Other; PCP: [name] [facility]); CB#: [phone]; Call regarding Medication Issue; Medication(s): Pancrease or Creon; Pt on Pancrease MT 4 to aid with digestion and cannot eat without medication on board. [phone], medication is unavaliable must have new Rx for Creon. Daughter unsure how much medication pt has left. Instructed daughter to find out amount of medication left and if medication will be out before 9/2 to have [facility] facility to contact office on 8/31 for refill. Protocol(s) Used: Office Note Recommended Outcome per Protocol: Information Noted and Sent to Office Reason for Outcome: Caller information to office Care Advice: ~ 08/07/2013 11:01:55PM Page 1 of 1 [id number]

## 2013-08-10 NOTE — Telephone Encounter (Signed)
Confidential Office Message [address] [place], [place] [address] p. [phone] f. [phone] To: Gar Gibbon (After Hours Triage) Fax: [phone] From: [facility] Date/ Time: 08/07/2013 8:44 PM Taken By: Ezra Sites, RN Caller: Alona Bene Facility: Not Collected Patient: Bridget Walker, Bridget Walker DOB: 1925-11-18 Phone: [phone] Reason for Call: Caller was unable to be reached on callback - Left Message Regarding Appointment: No Appt Date: Appt Time: Unknown Provider: Reason: Details: Outcome: Confidential

## 2013-08-10 NOTE — Telephone Encounter (Signed)
Please call in creon 3000 units po tid with meals  1 month supply with 11 refills Thanks  Did not see this dose in the data base for epic

## 2013-08-11 ENCOUNTER — Encounter: Admitting: Family Medicine

## 2013-08-11 ENCOUNTER — Telehealth: Payer: Self-pay | Admitting: Family Medicine

## 2013-08-11 NOTE — Telephone Encounter (Signed)
Katie @ [facility] wasn't aware that pt's daughter had med sent to [facility] [address] So she is going to call them and see if they have her med ready and see if they can get it from them, if she needs [place] to send a Rx [facility] she will call us back

## 2013-08-11 NOTE — Telephone Encounter (Signed)
That is fine - I thought we changed it however ? (see prev phone notes) Whatever they want done is fine for me

## 2013-08-11 NOTE — Telephone Encounter (Signed)
Katie @ [address] called and says pt normally gets her Creon from [facility], however pt's daughter says it's on national back order. Pt only has enough to Saturday morning.  Katie wants to know if it's ok to switch to [facility] for this medication in order to get enough for pt for this weekend and next weekend.  You can contact Katie w/any info or questions.  Thank you.

## 2013-08-13 ENCOUNTER — Telehealth: Payer: Self-pay

## 2013-08-13 NOTE — Telephone Encounter (Signed)
The pharmacist calculated the recommended dose of creon to be 3000 tid (to be as close to the pancrease as possible)

## 2013-08-13 NOTE — Telephone Encounter (Signed)
Left message on Bridget Walker's voicemail letting her know Dr. Royden Purl comments

## 2013-08-13 NOTE — Telephone Encounter (Signed)
Katie nurse independent living at [facility] left v/m to clarify dosage of Creon; on bottle says Creon 3000 units take one capsule three times a day with meals however on previous Creon rx instructions take 2 tabs three times daily.Please advise.

## 2013-08-17 ENCOUNTER — Other Ambulatory Visit: Payer: Self-pay | Admitting: *Deleted

## 2013-08-17 MED ORDER — MEGESTROL ACETATE 40 MG/ML PO SUSP: 400.0000 mg | Freq: Every day | ORAL | Status: DC

## 2013-08-17 NOTE — Telephone Encounter (Signed)
Pt's daughter Alona Bene left voicemail: [name] called daughter to let her know pt's Rx had no more refills on it and oncologist thinks its a good idea for pt to stay on megestrol since pt isn't gaining weight. Daughter would need [facility] please advise

## 2013-08-17 NOTE — Telephone Encounter (Signed)
[facility] Looks like she takes 10 ml per day - for 30 days that would be 300 ml (3 months would be )

## 2013-08-18 NOTE — Telephone Encounter (Signed)
Pt's daughter notified Rx sent to mail order pharmacy

## 2013-08-19 ENCOUNTER — Ambulatory Visit (INDEPENDENT_AMBULATORY_CARE_PROVIDER_SITE_OTHER): Payer: Medicare Other | Admitting: Internal Medicine

## 2013-08-19 ENCOUNTER — Encounter: Payer: Self-pay | Admitting: Internal Medicine

## 2013-08-19 VITALS — BP 116/76 | HR 89 | Ht 60.0 in | Wt 119.8 lb

## 2013-08-19 DIAGNOSIS — Z23 Encounter for immunization: Secondary | ICD-10-CM | POA: Diagnosis not present

## 2013-08-19 DIAGNOSIS — R911 Solitary pulmonary nodule: Secondary | ICD-10-CM

## 2013-08-19 DIAGNOSIS — J438 Other emphysema: Secondary | ICD-10-CM | POA: Diagnosis not present

## 2013-08-19 DIAGNOSIS — J439 Emphysema, unspecified: Secondary | ICD-10-CM

## 2013-08-19 MED ORDER — PREDNISONE 5 MG PO TABS: ORAL_TABLET | ORAL | Status: DC

## 2013-08-19 NOTE — Progress Notes (Signed)
06/08/12- 20 yoF former smoker  referred by Dr Benay Spice. Pt states having Increase SOB upon activity Productive cough in am . PCP Dr Loura Pardon    Son here She is being monitored for possible pancreatic tumor and has history of a biliary stent. For about 3 months she has been aware of dyspnea on exertion while walking, relieved by sitting. She had a "mild outpatient pneumonia" treated 2 weeks ago. She had been exercising at the "[facility]. She was noting more shortness of breath with exertion after bad cold and some shingles. Weight had been going down. She has been drinking diet supplements to gain weight. She denies cough, wheeze, chest pain. She does hear a rattle but coughs out nothing. Legs feel weak. She denies fever, blood or swollen glands. She denies past history of asthma or pneumonia and she denies heart disease, reflux, choking with meals. She is widowed, living in a retirement center. 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 Father died with pneumonia, mother died with MI. CT chest was concerning for density, more than a "mild pneumonia". CT chest 05/07/12- reviewed with her IMPRESSION:  No evidence of significant pulmonary embolus. The extensive  consolidation of the right lower lung and right middle lung with  patchy areas of nodular infiltration in the left lower lung.  Changes may represent pneumonia. No definite evidence of a central  obstructing lesion although the distal right main stem bronchus is  somewhat narrowed and a cold mass may be obscured by the  consolidation. Recommend follow up after resolution of acute  symptoms. Large esophageal hiatal hernia with mild esophageal  dilatation. Esophagitis versus dysmotility. Biliary wall stent  and biliary gas demonstrated in the upper abdomen.  Original Report Authenticated By: Neale Burly, M.D.   CXR 06/03/12- IMPRESSION:  1. Multi focal  right lung airspace disease has not significantly  changed since 05/07/2012.  2. Interval increased patchy opacity at the left lung base.  Original Report Authenticated By: Randall An, M.D.   07/07/12- 56 yoF former smoker followed for pneumonia complicated by large hiatal hernia, pancreatic cancer. Dr Sherrill/ Onc> Hospice. Daughter and son here today. She is now followed by hospice because of pancreatic cancer with biliary stent. Notices dyspnea standing, and cough as she gets out of bed and starts moving. Mostly clear phlegm, occasional scant green. Wakes herself coughing. Avelox did not help. We reviewed the CT scan images again, nothing large hiatal hernia full of food and parenchymal consolidation consistent with pneumonia and especially consistent with aspiration pneumonia. Discussed possibility of organizing pneumonia.  07/27/12-  55 yoF former smoker followed for pneumonia complicated by large hiatal hernia, pancreatic cancer.  Dtr here. Sputum CX 06/10/12- Nl flora, neg for AFB. Dr Ardis Hughs has indicated steroid trial, aimed at organizing pneumonia, is ok from pancreas standpoint.  Very short of breath after shower. Home oxygen helps, provided by Hospice through? [facility]. No pain, fever, sweat. Scant clear mucus. Flutter device helps shortness of breath after use, but does not generate productive cough. She denies any choking or strangling with meals or during sleep. CXR I 07/27/12- images reviewed with them. IMPRESSION:  1. Worsening asymmetric airspace disease involving right lung  greater than left, suspicious for pneumonia.  2. Stable mild cardiomegaly.  3. Hiatal hernia again noted.  Original Report Authenticated By: Marlaine Hind, M.D.   08/13/12- 22 yoF former smoker followed for  pneumonia complicated by large hiatal hernia, pancreatic cancer.  Dtr here.  Pt reports legs no longer hurt, no more coughing in the morning, wearing O2 most of the time--not today. Has continued  maintenance prednisone 20 mg daily and recognizes breathing is better but it gives her some insomnia. Has elevated head of bed. Using oxygen at 2 L only as needed now. Not coughing at all. Wants flu vax CXR 08/13/12- IMPRESSION:  Improved right lower lobe and left lung base infiltrates.  Underlying COPD. No new abnormalities.   10/02/12- 69 yoF former smoker followed for pneumonia complicated by large hiatal hernia, pancreatic cancer.  Dtr here. Follow-Up  - has been feeling very anxious lately. Hospice nurse thinks that she needs something for anxiety. Prednisone seemed to reduce the amount of parenchymal density, consistent with impression that it was organizing pneumonia. She is followed with a hospice nurse. Reducing prednisone to 5 mg daily left her too short of breath, so  she went back to 10 mg daily. Less morning cough. Recent night sweats. Low back pain sometimes radiates to abdomen. CXR 10/02/12- we reviewed the images together IMPRESSION:  Improved airspace disease in the right mid and lower lung zones.  Persistent opacity is noted  Stable spiculated opacity at the left lung base. Underlying  malignancy cannot be excluded.  Original Report Authenticated By: Donavan Burnet, M.D.   03/22/13- 43 yoF former smoker followed for pneumonia complicated by large hiatal hernia.  Dtr here. FOLLOWS FOR: breathing doing good as long as wearing O2; daughter states that pt is still breathy; hospice is pulling out as pt does not have pancreatic cancer; will need order for O2 through DME company since hospice is no longer going to help with this.  Has been using prednisone 10 mg alternating with 5 mg every other day. Makes her anxious. Mentions pains and right rib cage that seem to come and go.  05/17/13- 40 yoF former smoker followed for Hx pneumonia, COPD/E,  complicated by large hiatal hernia.  Dtr here. FOLLOWS FOR: continues to use O2 QHS. Breathing is doing okay O2 2-4L/ [facility], just for  sleep and as needed, but she doesn't think she needs much. Moving to assisted living. Per the right side of her back exercising, helped by physical therapy Prednisone now reduced to 5 mg daily. CXR 03/23/13-Stable spiculated opacity at the left lung base. Large hiatal hernia. IMPRESSION:  No acute abnormality is noted.  Original Report Authenticated By: Alcide Clever, M.D  08/19/13- 89 yoF former smoker followed for Hx pneumonia, COPD/E, lung nodule,  complicated by large hiatal hernia.  Dtr here FOLLOWS FOR: unsure if needs to go back to O2; uses her flutter device and feels that  helps some; had called about SOB and had prednisone increased. Cut off oxygen  Then tapered prednisone. Started getting more short of breath so she increased the prednisone back to 10 mg daily for the past month. Occasional dyspnea on exertion in assisted living but now feels comfortable.  //needs f/u CXR for lung nodule//  ROS-see HPI Constitutional:   No-  weight loss, night sweats, fevers, chills, fatigue, lassitude. HEENT:   No-  headaches, difficulty swallowing, tooth/dental problems, sore throat,       No-  sneezing, itching, ear ache, nasal congestion, post nasal drip,  CV:  No-   chest pain, orthopnea, PND, swelling in lower extremities, anasarca,dizziness, palpitations Resp: +shortness of breath with exertion or at rest.  No- productive cough,  No non-productive cough,  No- coughing up of blood.              No-   change in color of mucus.  No- wheezing.   Skin: No-   rash or lesions. GI:  No-   heartburn, indigestion, abdominal pain, nausea, vomiting,  GU:  MS:  No-   joint pain or swelling.  +back pain Neuro-     nothing unusual Psych:  No- change in mood or affect.  depression or +anxiety.  No memory loss.  OBJ- Physical Exam General- Alert, Oriented, Affect-appropriate, Distress- none acute, thin elderly woman Skin- rash-none, lesions- none, excoriation- none Lymphadenopathy-  none Head- atraumatic            Eyes- Gross vision intact, PERRLA, conjunctivae and secretions clear            Ears- Hearing, canals-normal            Nose- Clear, no-Septal dev, mucus, polyps, erosion, perforation             Throat- Mallampati II , mucosa clear , drainage- none, tonsils- atrophic. Own teeth Neck- flexible , trachea midline, no stridor , thyroid nl, carotid no bruit Chest - symmetrical excursion , unlabored           Heart/CV- RRR/few extra beats , no murmur , no gallop  , no rub, nl s1 s2                           - JVD- none , edema- none, stasis changes- none, varices- none           Lung- . Clear, rub- none, no cough or wheeze           Chest wall-  Abd-  Br/ Gen/ Rectal- Not done, not indicated Extrem- cyanosis- none, clubbing, none, atrophy- none, strength- nl, using a walker Neuro- grossly intact to observation

## 2013-08-19 NOTE — Patient Instructions (Addendum)
Flu vax  Ok to alternate prednisone 10 mg with 5 mg, every other day- script written  You should have a follow-up chest xray every 6 months or so, to ensure that nodule is just a scar.

## 2013-08-20 DIAGNOSIS — T148XXA Other injury of unspecified body region, initial encounter: Secondary | ICD-10-CM | POA: Diagnosis not present

## 2013-08-24 ENCOUNTER — Telehealth: Payer: Self-pay | Admitting: Internal Medicine

## 2013-08-24 NOTE — Telephone Encounter (Signed)
lmomtcb x1 for [employer]

## 2013-08-25 NOTE — Telephone Encounter (Signed)
ATC Katie @ number provided, no answer [id number]

## 2013-08-26 NOTE — Telephone Encounter (Signed)
ATC Katie @ number provided no answer [id number]

## 2013-08-27 NOTE — Telephone Encounter (Signed)
Looked up alternate # for [employer] ---[phone] Phone rang several times with no answer and no option to leave message  Also ATC number provided, no asnwer [facility]  Will sign off at this time as several attempts have been made to contact [facility]

## 2013-08-28 ENCOUNTER — Encounter: Payer: Self-pay | Admitting: Internal Medicine

## 2013-08-28 NOTE — Assessment & Plan Note (Signed)
Fair control. Plan-flu vaccine. Alternate prednisone 10 mg with 5 mg every other day to reduce total dose

## 2013-08-28 NOTE — Assessment & Plan Note (Signed)
This may be scar. She is too frail to tolerate much intervention. Plan-chest x-ray next visit

## 2013-09-02 ENCOUNTER — Telehealth: Payer: Self-pay

## 2013-09-02 NOTE — Telephone Encounter (Signed)
Please verbally ok that - if it needs to be written I will have to do it when I am back in the office tomorrow

## 2013-09-02 NOTE — Telephone Encounter (Signed)
The do need written orders and they are okay with waiting until tomorrow when you are in the office

## 2013-09-02 NOTE — Telephone Encounter (Signed)
Bridget Walker with assisted living at [facility] said pt has a prn order for Miralax; the pt and staff at [facility] request and order faxed to [phone] stating pt can use Miralax independently as needed.Please advise.

## 2013-09-03 NOTE — Telephone Encounter (Signed)
Orders faxed

## 2013-09-03 NOTE — Telephone Encounter (Signed)
Order in IN box 

## 2013-09-08 HISTORY — PX: CARDIAC CATHETERIZATION: SHX172

## 2013-09-14 ENCOUNTER — Encounter: Payer: Self-pay | Admitting: Family Medicine

## 2013-09-14 ENCOUNTER — Ambulatory Visit (INDEPENDENT_AMBULATORY_CARE_PROVIDER_SITE_OTHER): Payer: Medicare Other | Admitting: Family Medicine

## 2013-09-14 VITALS — BP 136/84 | HR 97 | Temp 98.4°F | Ht 60.0 in | Wt 120.8 lb

## 2013-09-14 DIAGNOSIS — M810 Age-related osteoporosis without current pathological fracture: Secondary | ICD-10-CM | POA: Diagnosis not present

## 2013-09-14 DIAGNOSIS — E559 Vitamin D deficiency, unspecified: Secondary | ICD-10-CM | POA: Diagnosis not present

## 2013-09-14 DIAGNOSIS — R634 Abnormal weight loss: Secondary | ICD-10-CM

## 2013-09-14 DIAGNOSIS — IMO0002 Reserved for concepts with insufficient information to code with codable children: Secondary | ICD-10-CM | POA: Diagnosis not present

## 2013-09-14 MED ORDER — ALENDRONATE SODIUM 70 MG PO TABS: 70.0000 mg | ORAL_TABLET | ORAL | Status: DC

## 2013-09-14 NOTE — Progress Notes (Signed)
Subjective:    Patient ID: Bridget Walker, female    DOB: [birth year], 79 y.o.   MRN: [id number]  HPI Here to disc OP  Is doing fair  Sob is troublesome for her - continues to see pulmonary for copd (had cut down prednisone) Has moved to assisted living now - has meals provided for her and also nurses dispense medication Is getting some better nutrition and gaining weight   She uses a walker when she walks outside her apartment   Will start a water class next week   Has last dexa 3/13 with LS T score -3.0 and FN of -2.2 Has had comp fx  On miacalcin Ca and D (Last D level was 670 improved)  Former smoker On PPI On prednisone intermittent for copd Adv age   Patient Active Problem List   Diagnosis Date Noted  . Compression fracture of spine 06/10/2013  . Back pain of thoracolumbar region 06/09/2013  . Mobility impaired 04/16/2013  . Frequent PVCs 03/28/2013  . COPD with emphysema 03/28/2013  . Lung nodule, L lower lobe 10/15/2012  . Lobar pneumonia due to unspecified organism 06/11/2012  . Abnormal CT scan, gastrointestinal tract 03/24/2012  . Nonspecific (abnormal) findings on radiological and other examination of biliary tract 03/24/2012  . Weight loss 03/18/2012  . Fatigue 03/18/2012  . Jaundice 03/18/2012  . Loss of appetite 03/18/2012  . Anemia 11/29/2011  . TINNITUS 08/09/2010  . OVERACTIVE BLADDER 03/14/2010  . GERD 11/06/2009  . UNSPECIFIED VITAMIN D DEFICIENCY 10/31/2008  . DIVERTICULOSIS OF COLON 12/25/2007  . RESTLESS LEG SYNDROME 10/26/2007  . COLONIC POLYPS, HX OF 10/26/2007  . HYPERCHOLESTEROLEMIA 09/23/2007  . OSTEOARTHRITIS 09/23/2007  . OSTEOPOROSIS 09/23/2007   Past Medical History  Diagnosis Date  . Osteoporosis   . Hyperlipidemia   . RLS (restless legs syndrome)   . Vitamin D deficiency   . GERD (gastroesophageal reflux disease)   . Cerumen impaction     recurrent  . Unexplained weight loss   . Cough   . Constipation   . Diarrhea   .  Shingles   . Overactive bladder   . Cancer     pancreatic  . Arthritis     OA, in hands   Past Surgical History  Procedure Laterality Date  . Cataract extraction      patient unsure of date procedure was done.  . Cataract extraction  11/08    2nd cataract  . Ercp  03/24/2012    Procedure: ENDOSCOPIC RETROGRADE CHOLANGIOPANCREATOGRAPHY (ERCP);  Surgeon: Rachael Fee, MD;  Location: [facility];  Service: Endoscopy;  Laterality: N/A;  . Eus  04/02/2012    Procedure: ESOPHAGEAL ENDOSCOPIC ULTRASOUND (EUS) RADIAL;  Surgeon: Rachael Fee, MD;  Location: [facility];  Service: Endoscopy;  Laterality: N/A;  EUS with FNA  . Fine needle aspiration  04/02/2012    Procedure: FINE NEEDLE ASPIRATION (FNA) LINEAR;  Surgeon: Rachael Fee, MD;  Location: [facility];  Service: Endoscopy;  Laterality: N/A;  . Endoscopic retrograde cholangiopancreatography (ercp) with propofol N/A 01/28/2013    Procedure: ENDOSCOPIC RETROGRADE CHOLANGIOPANCREATOGRAPHY (ERCP) WITH PROPOFOL;  Surgeon: Rachael Fee, MD;  Location: [facility];  Service: Endoscopy;  Laterality: N/A;   History  Substance Use Topics  . Smoking status: Former Smoker -- 35 years    Types: Cigarettes    Quit date: 03/23/1992  . Smokeless tobacco: Never Used     Comment: PT SMOKED 2 CIGARETTES A DAY   . Alcohol Use: No  Family History  Problem Relation Age of Onset  . Diabetes Sister   . Hypertension Sister   . Heart disease Brother   . Fibromyalgia Daughter   . Alcohol abuse Son   . Heart disease Brother   . Diabetes Brother   . Heart disease Mother   . Pneumonia Father   . Cancer Other     niece had breast and bone cancer  . Heart failure Father    Allergies  Allergen Reactions  . Lactose Intolerance (Gi)     Pt can drink skim milk  . Hydrocodone Rash    Patient unsure of reaction, but it may have been diarrhea.  . Penicillins Rash    On scalp   Current Outpatient Prescriptions on File Prior to Visit   Medication Sig Dispense Refill  . acetaminophen (TYLENOL) 500 MG tablet Take by mouth. 1-2 tab every 6 hrs prn      . ALPRAZolam (XANAX) 0.25 MG tablet TAKE 1 TABLET BY MOUTH 3 TIMES A DAY AS NEEDED FOR ANXIETY  270 tablet  1  . calcitonin, salmon, (MIACALCIN/FORTICAL) 200 UNIT/ACT nasal spray Place 1 spray into the nose daily. Alternate nostrils each day      . calcium-vitamin D (OSCAL) 250-125 MG-UNIT per tablet Take 2 tablets by mouth daily.      Marland Kitchen esomeprazole (NEXIUM) 40 MG capsule TAKE 1 CAPSULE (40 MG TOTAL) BY MOUTH DAILY BEFORE BREAKFAST.  90 capsule  3  . guaiFENesin (MUCINEX) 600 MG 12 hr tablet Take 600 mg by mouth daily. For cough and increased mucus      . Ibuprofen 200 MG CAPS Take by mouth. Take 1-2 tablets every 8 hours prn pain      . megestrol (MEGACE) 40 MG/ML suspension Take 10 mLs (400 mg total) by mouth daily.  240 mL  0  . meloxicam (MOBIC) 7.5 MG tablet Take 1 tablet (7.5 mg total) by mouth daily.  90 tablet  0  . Pancrelipase, Lip-Prot-Amyl, (CREON) 3000-9500 UNITS CPEP Take 1 tablet by mouth 3 (three) times daily.      . polyethylene glycol powder (GLYCOLAX/MIRALAX) powder Take 17 g by mouth daily as needed.  527 g  3  . predniSONE (DELTASONE) 5 MG tablet Alternate 5 mg with 10 mg, every other day  50 tablet  5  . traMADol (ULTRAM) 50 MG tablet Take 1 tablet (50 mg total) by mouth 2 (two) times daily as needed for pain.  60 tablet  0  . Vitamin D, Ergocalciferol, (DRISDOL) 50000 UNITS CAPS Take 1 capsule (50,000 Units total) by mouth every 7 (seven) days.  30 capsule  0   No current facility-administered medications on file prior to visit.    Review of Systems Review of Systems  Constitutional: Negative for fever, appetite change, fatigue and unexpected weight change.  Eyes: Negative for pain and visual disturbance.  Respiratory: Negative for cough and pos for sob with exertion Cardiovascular: Negative for cp or palpitations    Gastrointestinal: Negative for  nausea, diarrhea and constipation.  Genitourinary: Negative for urgency and frequency.  Skin: Negative for pallor or rash   Neurological: Negative for weakness, light-headedness, numbness and headaches.  Hematological: Negative for adenopathy. Does not bruise/bleed easily.  Psychiatric/Behavioral: Negative for dysphoric mood. The patient is not nervous/anxious.         Objective:   Physical Exam  Constitutional: She appears well-developed and well-nourished. No distress.  Frail kyphotic elderly female with walker  HENT:  Head: Normocephalic and  atraumatic.  Mouth/Throat: Oropharynx is clear and moist.  Eyes: Conjunctivae and EOM are normal. Pupils are equal, round, and reactive to light. No scleral icterus.  Neck: Normal range of motion. Neck supple. No JVD present. Carotid bruit is not present. No thyromegaly present.  Cardiovascular: Normal rate, regular rhythm, normal heart sounds and intact distal pulses.  Exam reveals no gallop.   Pulmonary/Chest: Effort normal and breath sounds normal. No respiratory distress. She has no wheezes. She has no rales.  No crackles  Abdominal: Soft. Bowel sounds are normal. She exhibits no distension and no abdominal bruit. There is no tenderness.  Musculoskeletal: She exhibits no edema.  Kyphosis  Petite frame  No spinous tenderness  Lymphadenopathy:    She has no cervical adenopathy.  Neurological: She is alert. She has normal reflexes. No cranial nerve deficit. She exhibits normal muscle tone. Coordination normal.  Skin: Skin is warm and dry. No pallor.  Psychiatric: She has a normal mood and affect.          Assessment & Plan:

## 2013-09-14 NOTE — Patient Instructions (Signed)
For bone density : take generic fosamax 1 pill weekly first thing in the am , on empty stomach , with a glass of water- and do not eat or take other medicines or lie down for 30 minutes If any side effects like heartburn or chest pain or swallowing problems- stop the medicine and let me know  If all goes well you will take this for 5 years Continue calcium and vitamin D  D level today

## 2013-09-15 NOTE — Assessment & Plan Note (Signed)
Rev dexa Hx of comp fx  High fall risk- disc safety Rev ca and D  D level today to see if she can get off high dose tx  Start fosamax after disc of poss side eff- if problems will stop it  inst written for assist living  Plan for 5 y  dexa every 2 y

## 2013-09-15 NOTE — Assessment & Plan Note (Signed)
Resolved with high dose weekly tx  Level today and then adv

## 2013-09-15 NOTE — Assessment & Plan Note (Signed)
Improved with better nutrition at assist living

## 2013-09-16 ENCOUNTER — Ambulatory Visit (HOSPITAL_BASED_OUTPATIENT_CLINIC_OR_DEPARTMENT_OTHER): Payer: Medicare Other | Admitting: Oncology

## 2013-09-16 ENCOUNTER — Other Ambulatory Visit: Payer: Self-pay | Admitting: Family Medicine

## 2013-09-16 VITALS — BP 132/80 | HR 95 | Temp 97.5°F | Resp 20 | Ht 60.0 in | Wt 121.1 lb

## 2013-09-16 DIAGNOSIS — R933 Abnormal findings on diagnostic imaging of other parts of digestive tract: Secondary | ICD-10-CM | POA: Diagnosis not present

## 2013-09-16 NOTE — Progress Notes (Signed)
   [facility]    OFFICE PROGRESS NOTE   INTERVAL HISTORY:   Ms. Carre returns as scheduled. She feels well. Good appetite. She is no longer using oxygen. She has moved into assisted living. Her daughter reports that she was diagnosed with another compression fracture recently. The back pain has improved.  Objective:  Vital signs in last 24 hours:  Blood pressure 132/80, pulse 95, temperature 97.5 F (36.4 C), temperature source Oral, resp. rate 20, height 5' (1.524 m), weight 121 lb 1.6 oz (54.931 kg), SpO2 95.00%.    HEENT: Sclera anicteric, neck without mass Lymphatics: No cervical, supraclavicular, or axillary nodes Resp: Lungs with inspiratory rhonchi at the bases bilaterally, no respiratory distress Cardio: Regular rate and rhythm GI: No hepatomegaly, nontender, no mass Vascular: No leg edema   Medications: I have reviewed the patient's current medications.  Assessment/Plan: 1.Pancreas head mass-status post an ERCP brushing and EUS biopsy that were nondiagnostic , the clinical history was consistent with pancreas cancer. The patient and her family were comfortable with no further diagnostic evaluation. Repeat CT 01/27/2013 with no evidence of malignancy, specifically no pancreas mass was identified.  2. Obstructive jaundice secondary to pancreas head mass-status post placement of a bile duct stent 03/24/2012.  3. history of Weight loss- improved, she continues Megace-the daughter reports her orthopedic physician wants her to gain more weight 4. Cough- chest x-ray and CT on 05/07/12 confirmed extensive infiltrate in the right lung, she completed 10 days of antibiotics. There is a persistent cough and abnormal lung exam . She completed another course of antibiotics under the direction of Dr.Young. She is now maintained on home oxygen and prednisone. The dyspnea has improved. A chest x-ray was improved on 10/03/2012.  5. acute low back/abdominal pain in November  2013-a plain x-ray on 10/29/2012 confirmed a new T10 compression fracture  6. Admission to [facility] hospital in late December of 2013 with "sepsis " -likely secondary to biliary sepsis from an obstructed stent, status post ERCP with placement of a new stent on 01/28/2013    Disposition:  She appears well. Ms. Levay does not appear to have pancreas cancer. She will continue clinical followup with Dr. Milinda Antis. She knows to seek medical attention for a fever or chills. She continues clinical and x-ray followup with Dr. Maple Hudson for pulmonary issues.  She is not scheduled for a followup appointment at the [facility]. We will see her in the future as needed.   Thornton Papas, MD  09/16/2013  1:35 PM

## 2013-09-16 NOTE — Telephone Encounter (Signed)
Last office visit 09/14/2013.  Ok to refill? 

## 2013-09-16 NOTE — Telephone Encounter (Signed)
Please refillf or 6 mo , thanks 

## 2013-09-17 ENCOUNTER — Encounter: Payer: Self-pay | Admitting: *Deleted

## 2013-09-17 NOTE — Telephone Encounter (Signed)
done

## 2013-09-21 DIAGNOSIS — T148XXA Other injury of unspecified body region, initial encounter: Secondary | ICD-10-CM | POA: Diagnosis not present

## 2013-09-29 ENCOUNTER — Other Ambulatory Visit: Payer: Self-pay

## 2013-09-29 MED ORDER — CALCITONIN (SALMON) 200 UNIT/ACT NA SOLN: 1.0000 | Freq: Every day | NASAL | Status: DC

## 2013-09-29 MED ORDER — TRAMADOL HCL 50 MG PO TABS: 50.0000 mg | ORAL_TABLET | Freq: Two times a day (BID) | ORAL | Status: DC | PRN

## 2013-09-29 MED ORDER — ALENDRONATE SODIUM 70 MG PO TABS: 70.0000 mg | ORAL_TABLET | ORAL | Status: DC

## 2013-09-29 NOTE — Telephone Encounter (Signed)
Bridget Walker pts daughter request [facility]ill [facility]itonin [facility] said [facility] not [facility]on and is there a substitute med that could be [facility] to [facility] for Creon. Pt also thinks pt is on pill for osteoporosis also but Bridget Walker cannot remember name of med and wants that med [facility] (if Dr Milinda Antis knows which pill was given for osteoporosis). Bridget Walker request cb when refilled.

## 2013-09-29 NOTE — Telephone Encounter (Signed)
She is going to have to [facility] herself to find out what they have as sub for creon and let me know (I do not know what they carry) I will send the fosamax and miacalcin  The tramadol has to be printed and faxed I think due to change in the drug schedule-I will print that

## 2013-09-30 ENCOUNTER — Telehealth: Payer: Self-pay

## 2013-09-30 ENCOUNTER — Telehealth: Payer: Self-pay | Admitting: Internal Medicine

## 2013-09-30 DIAGNOSIS — R0609 Other forms of dyspnea: Secondary | ICD-10-CM | POA: Diagnosis not present

## 2013-09-30 DIAGNOSIS — K8689 Other specified diseases of pancreas: Secondary | ICD-10-CM | POA: Diagnosis not present

## 2013-09-30 DIAGNOSIS — K219 Gastro-esophageal reflux disease without esophagitis: Secondary | ICD-10-CM | POA: Diagnosis not present

## 2013-09-30 DIAGNOSIS — R0602 Shortness of breath: Secondary | ICD-10-CM | POA: Diagnosis not present

## 2013-09-30 DIAGNOSIS — I509 Heart failure, unspecified: Secondary | ICD-10-CM | POA: Diagnosis not present

## 2013-09-30 LAB — URINALYSIS, COMPLETE
Bacteria: NONE SEEN
Bilirubin,UR: NEGATIVE
Blood: NEGATIVE
Ketone: NEGATIVE
Leukocyte Esterase: NEGATIVE
Nitrite: NEGATIVE
Ph: 5 (ref 4.5–8.0)
RBC,UR: 4 /HPF (ref 0–5)
Specific Gravity: 1.021 (ref 1.003–1.030)
Squamous Epithelial: <1
WBC UR: <1 /HPF (ref 0–5)

## 2013-09-30 LAB — CBC WITH DIFFERENTIAL/PLATELET
Basophil #: 0 10*3/uL (ref 0.0–0.1)
Basophil %: 0.5 %
HGB: 13.8 g/dL (ref 12.0–16.0)
Lymphocyte %: 15.3 %
MCH: 31.6 pg (ref 26.0–34.0)
MCHC: 34 g/dL (ref 32.0–36.0)
MCV: 93 fL (ref 80–100)
Monocyte %: 8.7 %
Neutrophil %: 75.1 %
Platelet: 209 10*3/uL (ref 150–440)
RBC: 4.37 10*6/uL (ref 3.80–5.20)
RDW: 15.2 % — ABNORMAL HIGH (ref 11.5–14.5)
WBC: 9.9 10*3/uL (ref 3.6–11.0)

## 2013-09-30 LAB — COMPREHENSIVE METABOLIC PANEL
Albumin: 3.8 g/dL (ref 3.4–5.0)
Alkaline Phosphatase: 62 U/L (ref 50–136)
Anion Gap: 7 (ref 7–16)
BUN: 23 mg/dL — ABNORMAL HIGH (ref 7–18)
Bilirubin,Total: 0.4 mg/dL (ref 0.2–1.0)
Co2: 23 mmol/L (ref 21–32)
Creatinine: 1.06 mg/dL (ref 0.60–1.30)
EGFR (Non-African Amer.): 48 — ABNORMAL LOW
Osmolality: 283 (ref 275–301)
Potassium: 4.7 mmol/L (ref 3.5–5.1)
SGOT(AST): 54 U/L — ABNORMAL HIGH (ref 15–37)

## 2013-09-30 LAB — PRO B NATRIURETIC PEPTIDE: B-Type Natriuretic Peptide: 793 pg/mL — ABNORMAL HIGH (ref 0–450)

## 2013-09-30 NOTE — Telephone Encounter (Signed)
I agree- to send her to ER (I am not in the office today)

## 2013-09-30 NOTE — Telephone Encounter (Signed)
I spoke with Angelica Chessman at [facility]. She reports within the last 24 hrs pt has became more SOB w/ very little movement. With exertion her O2 sats drop to 88-89% RA. At rest she still feels winded but sats are at 93% RA. Pt no longer has O2. She reports her lungs sound clear, no cold like symptoms, HR is about 100 bpm. Pt alternated prednisone 5 and 10 mg daily. She called PCP and was advised to take her to the ED. Please advise Dr. Maple Hudson thanks Last OV 08/19/13 Pending 02/17/14  Allergies  Allergen Reactions  . Lactose Intolerance (Gi)     Pt can drink skim milk  . Hydrocodone Rash    Patient unsure of reaction, but it may have been diarrhea.  . Penicillins Rash    On scalp

## 2013-09-30 NOTE — Telephone Encounter (Signed)
Agree, with weekend coming, best to have her evaluated at ER.

## 2013-09-30 NOTE — Telephone Encounter (Signed)
I spoke with Angelica Chessman and is aware of recs. She voiced her understanding. I called and spoke with daughter as well. Made aware of recs. Nothing further needed

## 2013-09-30 NOTE — Telephone Encounter (Signed)
Pt's daughter called and said she gave an incorrect says you can reach [facility] at [phone].Raylene Everts

## 2013-09-30 NOTE — Telephone Encounter (Signed)
Pt's daughter has called back & is asking to have someone call Martyn Malay @ [facility] ASAP at [phone].  Please call Angelica Chessman first & then please call pt's daughter, Cherie Ouch, at [phone].  Antionette Fairy

## 2013-09-30 NOTE — Telephone Encounter (Signed)
[name] from [facility] assisted living said pt has had increased SOB last 24 hours; now pt is very SOB at rest.BP 118/68 and P 95-100. Pt has SOB but has worsened in last 24 hours; O2 sat is below 90 upon exertion and at rest 93%. [name] said pts SOB has had a major change. No available appts this afternoon and advised [name] to send pt to UC or ED for eval. [name] voiced understanding and will send pt out for eval.

## 2013-10-01 ENCOUNTER — Inpatient Hospital Stay: Payer: Self-pay | Admitting: Internal Medicine

## 2013-10-01 DIAGNOSIS — I509 Heart failure, unspecified: Secondary | ICD-10-CM | POA: Diagnosis not present

## 2013-10-01 DIAGNOSIS — K861 Other chronic pancreatitis: Secondary | ICD-10-CM | POA: Diagnosis not present

## 2013-10-01 DIAGNOSIS — Z87891 Personal history of nicotine dependence: Secondary | ICD-10-CM | POA: Diagnosis not present

## 2013-10-01 DIAGNOSIS — Z66 Do not resuscitate: Secondary | ICD-10-CM | POA: Diagnosis present

## 2013-10-01 DIAGNOSIS — K219 Gastro-esophageal reflux disease without esophagitis: Secondary | ICD-10-CM | POA: Diagnosis not present

## 2013-10-01 DIAGNOSIS — E875 Hyperkalemia: Secondary | ICD-10-CM | POA: Diagnosis present

## 2013-10-01 DIAGNOSIS — I129 Hypertensive chronic kidney disease with stage 1 through stage 4 chronic kidney disease, or unspecified chronic kidney disease: Secondary | ICD-10-CM | POA: Diagnosis present

## 2013-10-01 DIAGNOSIS — I5023 Acute on chronic systolic (congestive) heart failure: Secondary | ICD-10-CM | POA: Diagnosis not present

## 2013-10-01 DIAGNOSIS — F411 Generalized anxiety disorder: Secondary | ICD-10-CM | POA: Diagnosis present

## 2013-10-01 DIAGNOSIS — K449 Diaphragmatic hernia without obstruction or gangrene: Secondary | ICD-10-CM | POA: Diagnosis present

## 2013-10-01 DIAGNOSIS — R0609 Other forms of dyspnea: Secondary | ICD-10-CM | POA: Diagnosis not present

## 2013-10-01 DIAGNOSIS — I2589 Other forms of chronic ischemic heart disease: Secondary | ICD-10-CM | POA: Diagnosis present

## 2013-10-01 DIAGNOSIS — M810 Age-related osteoporosis without current pathological fracture: Secondary | ICD-10-CM | POA: Diagnosis present

## 2013-10-01 DIAGNOSIS — J9610 Chronic respiratory failure, unspecified whether with hypoxia or hypercapnia: Secondary | ICD-10-CM | POA: Diagnosis not present

## 2013-10-01 DIAGNOSIS — Z7982 Long term (current) use of aspirin: Secondary | ICD-10-CM | POA: Diagnosis not present

## 2013-10-01 DIAGNOSIS — I959 Hypotension, unspecified: Secondary | ICD-10-CM | POA: Diagnosis present

## 2013-10-01 DIAGNOSIS — R0602 Shortness of breath: Secondary | ICD-10-CM | POA: Diagnosis not present

## 2013-10-01 DIAGNOSIS — K8689 Other specified diseases of pancreas: Secondary | ICD-10-CM | POA: Diagnosis not present

## 2013-10-01 DIAGNOSIS — N179 Acute kidney failure, unspecified: Secondary | ICD-10-CM | POA: Diagnosis not present

## 2013-10-01 DIAGNOSIS — J449 Chronic obstructive pulmonary disease, unspecified: Secondary | ICD-10-CM | POA: Diagnosis present

## 2013-10-01 DIAGNOSIS — G259 Extrapyramidal and movement disorder, unspecified: Secondary | ICD-10-CM | POA: Diagnosis present

## 2013-10-01 DIAGNOSIS — Z885 Allergy status to narcotic agent status: Secondary | ICD-10-CM | POA: Diagnosis not present

## 2013-10-01 DIAGNOSIS — IMO0002 Reserved for concepts with insufficient information to code with codable children: Secondary | ICD-10-CM | POA: Diagnosis not present

## 2013-10-01 DIAGNOSIS — I2510 Atherosclerotic heart disease of native coronary artery without angina pectoris: Secondary | ICD-10-CM | POA: Diagnosis present

## 2013-10-01 DIAGNOSIS — Z880 Allergy status to penicillin: Secondary | ICD-10-CM | POA: Diagnosis not present

## 2013-10-01 NOTE — Telephone Encounter (Signed)
Pt's daughter notified Rx's sent to pharmacy and Tramadol faxed, and advise to check with pharmacy to see what sub they have for creon

## 2013-10-02 LAB — BASIC METABOLIC PANEL
Anion Gap: 11 (ref 7–16)
Calcium, Total: 9.3 mg/dL (ref 8.5–10.1)
Chloride: 99 mmol/L (ref 98–107)
Co2: 24 mmol/L (ref 21–32)
Creatinine: 1.15 mg/dL (ref 0.60–1.30)
EGFR (African American): 50 — ABNORMAL LOW
EGFR (Non-African Amer.): 43 — ABNORMAL LOW
Glucose: 274 mg/dL — ABNORMAL HIGH (ref 65–99)
Potassium: 3.9 mmol/L (ref 3.5–5.1)
Sodium: 134 mmol/L — ABNORMAL LOW (ref 136–145)

## 2013-10-02 LAB — CBC WITH DIFFERENTIAL/PLATELET
Basophil %: 0.3 %
Eosinophil #: 0 10*3/uL (ref 0.0–0.7)
HCT: 43.6 % (ref 35.0–47.0)
Lymphocyte #: 1.8 10*3/uL (ref 1.0–3.6)
Lymphocyte %: 17 %
Monocyte #: 0.7 x10 3/mm (ref 0.2–0.9)
Monocyte %: 7 %
Neutrophil #: 7.9 10*3/uL — ABNORMAL HIGH (ref 1.4–6.5)
Neutrophil %: 75.2 %
Platelet: 232 10*3/uL (ref 150–440)
RBC: 4.68 10*6/uL (ref 3.80–5.20)
WBC: 10.5 10*3/uL (ref 3.6–11.0)

## 2013-10-03 LAB — BASIC METABOLIC PANEL
Anion Gap: 9 (ref 7–16)
BUN: 47 mg/dL — ABNORMAL HIGH (ref 7–18)
Chloride: 97 mmol/L — ABNORMAL LOW (ref 98–107)
Creatinine: 2.58 mg/dL — ABNORMAL HIGH (ref 0.60–1.30)
EGFR (Non-African Amer.): 16 — ABNORMAL LOW
Glucose: 108 mg/dL — ABNORMAL HIGH (ref 65–99)
Osmolality: 279 (ref 275–301)
Sodium: 133 mmol/L — ABNORMAL LOW (ref 136–145)

## 2013-10-04 LAB — BASIC METABOLIC PANEL
EGFR (African American): 35 — ABNORMAL LOW
EGFR (Non-African Amer.): 30 — ABNORMAL LOW
Glucose: 107 mg/dL — ABNORMAL HIGH (ref 65–99)
Osmolality: 277 (ref 275–301)
Potassium: 5 mmol/L (ref 3.5–5.1)
Sodium: 133 mmol/L — ABNORMAL LOW (ref 136–145)

## 2013-10-04 LAB — PLATELET COUNT: Platelet: 251 10*3/uL (ref 150–440)

## 2013-10-05 ENCOUNTER — Telehealth: Payer: Self-pay

## 2013-10-05 DIAGNOSIS — J449 Chronic obstructive pulmonary disease, unspecified: Secondary | ICD-10-CM | POA: Diagnosis not present

## 2013-10-05 DIAGNOSIS — Z880 Allergy status to penicillin: Secondary | ICD-10-CM | POA: Diagnosis not present

## 2013-10-05 DIAGNOSIS — F411 Generalized anxiety disorder: Secondary | ICD-10-CM | POA: Diagnosis not present

## 2013-10-05 DIAGNOSIS — K8689 Other specified diseases of pancreas: Secondary | ICD-10-CM | POA: Diagnosis not present

## 2013-10-05 DIAGNOSIS — I5021 Acute systolic (congestive) heart failure: Secondary | ICD-10-CM | POA: Diagnosis not present

## 2013-10-05 DIAGNOSIS — I959 Hypotension, unspecified: Secondary | ICD-10-CM | POA: Diagnosis not present

## 2013-10-05 DIAGNOSIS — Z87891 Personal history of nicotine dependence: Secondary | ICD-10-CM | POA: Diagnosis not present

## 2013-10-05 DIAGNOSIS — I509 Heart failure, unspecified: Secondary | ICD-10-CM | POA: Diagnosis not present

## 2013-10-05 DIAGNOSIS — IMO0002 Reserved for concepts with insufficient information to code with codable children: Secondary | ICD-10-CM | POA: Diagnosis not present

## 2013-10-05 DIAGNOSIS — K219 Gastro-esophageal reflux disease without esophagitis: Secondary | ICD-10-CM | POA: Diagnosis not present

## 2013-10-05 DIAGNOSIS — Z885 Allergy status to narcotic agent status: Secondary | ICD-10-CM | POA: Diagnosis not present

## 2013-10-05 DIAGNOSIS — R5381 Other malaise: Secondary | ICD-10-CM | POA: Diagnosis not present

## 2013-10-05 DIAGNOSIS — Z888 Allergy status to other drugs, medicaments and biological substances status: Secondary | ICD-10-CM | POA: Diagnosis not present

## 2013-10-05 DIAGNOSIS — Z66 Do not resuscitate: Secondary | ICD-10-CM | POA: Diagnosis not present

## 2013-10-05 DIAGNOSIS — E860 Dehydration: Secondary | ICD-10-CM | POA: Diagnosis not present

## 2013-10-05 LAB — CBC WITH DIFFERENTIAL/PLATELET
Basophil %: 0.1 %
Eosinophil %: 0.1 %
HCT: 41.3 % (ref 35.0–47.0)
HGB: 14 g/dL (ref 12.0–16.0)
Lymphocyte %: 12 %
MCH: 31.5 pg (ref 26.0–34.0)
MCHC: 33.9 g/dL (ref 32.0–36.0)
Monocyte #: 0.8 x10 3/mm (ref 0.2–0.9)
Platelet: 232 10*3/uL (ref 150–440)
RDW: 14.9 % — ABNORMAL HIGH (ref 11.5–14.5)
WBC: 10 10*3/uL (ref 3.6–11.0)

## 2013-10-05 LAB — COMPREHENSIVE METABOLIC PANEL
Albumin: 3.5 g/dL (ref 3.4–5.0)
Alkaline Phosphatase: 62 U/L (ref 50–136)
Anion Gap: 7 (ref 7–16)
BUN: 44 mg/dL — ABNORMAL HIGH (ref 7–18)
Bilirubin,Total: 0.5 mg/dL (ref 0.2–1.0)
Calcium, Total: 9.3 mg/dL (ref 8.5–10.1)
Chloride: 105 mmol/L (ref 98–107)
Co2: 24 mmol/L (ref 21–32)
Creatinine: 1.32 mg/dL — ABNORMAL HIGH (ref 0.60–1.30)
EGFR (Non-African Amer.): 36 — ABNORMAL LOW
Glucose: 150 mg/dL — ABNORMAL HIGH (ref 65–99)
SGOT(AST): 45 U/L — ABNORMAL HIGH (ref 15–37)
SGPT (ALT): 37 U/L (ref 12–78)
Total Protein: 7.1 g/dL (ref 6.4–8.2)

## 2013-10-05 LAB — PRO B NATRIURETIC PEPTIDE: B-Type Natriuretic Peptide: 725 pg/mL — ABNORMAL HIGH (ref 0–450)

## 2013-10-05 NOTE — Telephone Encounter (Signed)
Triage Record Num: [id number] Operator: [place] [employer] Patient Name: Bridget Walker Call Date & Time: 10/04/2013 9:19:25PM Patient Phone: [phone] PCP: Audrie Gallus. Tower Caller Name: Ananias Pilgrim Relationship to Patient: Unknown Patient Gender: Female PCP Fax : Patient DOB: 09-22-1925 Practice Name: [facility] Reason for Call: Call regarding pt Bridget Walker was admitted to facility for observation s/p cardiac cath on 10/04/13; Facility is wanting office to be aware so that pt will be seen tomorrow; Tom, RN made aware that a note would be sent, but that someone from facility needs to call the office in the am also to make them aware of new pt, verbalizes understanding. Protocol(s) Used: Office Note Recommended Outcome per Protocol: Information Noted and Sent to Office Reason for Outcome: Caller information to office Care Advice:

## 2013-10-06 ENCOUNTER — Telehealth: Payer: Self-pay

## 2013-10-06 ENCOUNTER — Observation Stay: Payer: Self-pay | Admitting: Family Medicine

## 2013-10-06 DIAGNOSIS — F0390 Unspecified dementia without behavioral disturbance: Secondary | ICD-10-CM | POA: Diagnosis not present

## 2013-10-06 DIAGNOSIS — R0602 Shortness of breath: Secondary | ICD-10-CM | POA: Diagnosis not present

## 2013-10-06 DIAGNOSIS — I509 Heart failure, unspecified: Secondary | ICD-10-CM | POA: Diagnosis not present

## 2013-10-06 DIAGNOSIS — Z87891 Personal history of nicotine dependence: Secondary | ICD-10-CM | POA: Diagnosis not present

## 2013-10-06 DIAGNOSIS — M19049 Primary osteoarthritis, unspecified hand: Secondary | ICD-10-CM | POA: Diagnosis not present

## 2013-10-06 DIAGNOSIS — I959 Hypotension, unspecified: Secondary | ICD-10-CM | POA: Diagnosis not present

## 2013-10-06 DIAGNOSIS — J449 Chronic obstructive pulmonary disease, unspecified: Secondary | ICD-10-CM | POA: Diagnosis not present

## 2013-10-06 DIAGNOSIS — E860 Dehydration: Secondary | ICD-10-CM | POA: Diagnosis not present

## 2013-10-06 DIAGNOSIS — K219 Gastro-esophageal reflux disease without esophagitis: Secondary | ICD-10-CM | POA: Diagnosis not present

## 2013-10-06 DIAGNOSIS — E7800 Pure hypercholesterolemia, unspecified: Secondary | ICD-10-CM | POA: Diagnosis not present

## 2013-10-06 DIAGNOSIS — I5021 Acute systolic (congestive) heart failure: Secondary | ICD-10-CM | POA: Diagnosis not present

## 2013-10-06 DIAGNOSIS — I4949 Other premature depolarization: Secondary | ICD-10-CM | POA: Diagnosis not present

## 2013-10-06 DIAGNOSIS — R5381 Other malaise: Secondary | ICD-10-CM | POA: Diagnosis not present

## 2013-10-06 DIAGNOSIS — N19 Unspecified kidney failure: Secondary | ICD-10-CM | POA: Diagnosis not present

## 2013-10-06 DIAGNOSIS — I5020 Unspecified systolic (congestive) heart failure: Secondary | ICD-10-CM | POA: Diagnosis not present

## 2013-10-06 DIAGNOSIS — F411 Generalized anxiety disorder: Secondary | ICD-10-CM | POA: Diagnosis not present

## 2013-10-06 DIAGNOSIS — M549 Dorsalgia, unspecified: Secondary | ICD-10-CM | POA: Diagnosis not present

## 2013-10-06 DIAGNOSIS — F068 Other specified mental disorders due to known physiological condition: Secondary | ICD-10-CM | POA: Diagnosis not present

## 2013-10-06 DIAGNOSIS — I5022 Chronic systolic (congestive) heart failure: Secondary | ICD-10-CM | POA: Diagnosis not present

## 2013-10-06 DIAGNOSIS — K8689 Other specified diseases of pancreas: Secondary | ICD-10-CM | POA: Diagnosis not present

## 2013-10-06 DIAGNOSIS — J438 Other emphysema: Secondary | ICD-10-CM | POA: Diagnosis not present

## 2013-10-06 DIAGNOSIS — M6281 Muscle weakness (generalized): Secondary | ICD-10-CM | POA: Diagnosis not present

## 2013-10-06 LAB — URINALYSIS, COMPLETE
Bacteria: NONE SEEN
Bilirubin,UR: NEGATIVE
Hyaline Cast: 8
Ketone: NEGATIVE
Leukocyte Esterase: NEGATIVE
Nitrite: NEGATIVE
Ph: 5 (ref 4.5–8.0)
Protein: NEGATIVE
RBC,UR: 2 /HPF (ref 0–5)
Specific Gravity: 1.02 (ref 1.003–1.030)
Squamous Epithelial: NONE SEEN
WBC UR: <1 /HPF (ref 0–5)

## 2013-10-06 NOTE — Telephone Encounter (Signed)
Patient contacted regarding discharge from [facility] on 10/06/13.  Lmom for pt to call back.   

## 2013-10-06 NOTE — Telephone Encounter (Signed)
Aware - will wait for records

## 2013-10-06 NOTE — Telephone Encounter (Signed)
[facility] Triage Call Report Triage Record Num: [id number] Operator: Rebeca Allegra Patient Name: Bridget Walker Call Date & Time: 10/05/2013 7:24:18PM Patient Phone: [phone] PCP: Audrie Gallus. Tower Patient Gender: Female PCP Fax : Patient DOB: 1925-01-22 Practice Name: [facility] Reason for Call: Caller: [name]/RN; PCP: [name] [facility]); CB#: [phone]; Call regarding Condition Change; Pt s/p cardiac cath 10/04/13. [name] reports weakness, SOB and overall condition change. EMS called to transport pt out. Protocol(s) Used: Office Note Recommended Outcome per Protocol: Information Noted and Sent to Office Reason for Outcome: Caller information to office Care Advice:

## 2013-10-07 NOTE — Telephone Encounter (Signed)
Patient contacted regarding discharge from [facility] on 10/06/13.  2nd attempt to contact pt for TCM appt w/ Dr. Mariah Milling 10/20/13 @ 11:15.  Left message on machine for pt to call back.

## 2013-10-08 ENCOUNTER — Telehealth: Payer: Self-pay | Admitting: Family Medicine

## 2013-10-08 DIAGNOSIS — J449 Chronic obstructive pulmonary disease, unspecified: Secondary | ICD-10-CM | POA: Diagnosis not present

## 2013-10-08 DIAGNOSIS — I5022 Chronic systolic (congestive) heart failure: Secondary | ICD-10-CM

## 2013-10-08 DIAGNOSIS — F0390 Unspecified dementia without behavioral disturbance: Secondary | ICD-10-CM | POA: Diagnosis not present

## 2013-10-08 DIAGNOSIS — F22 Delusional disorders: Secondary | ICD-10-CM

## 2013-10-08 DIAGNOSIS — I959 Hypotension, unspecified: Secondary | ICD-10-CM | POA: Diagnosis not present

## 2013-10-08 NOTE — Telephone Encounter (Signed)
Patient's daughter wanted to let you know patient went to [facility] .  She's at [facility] for a couple of weeks.  She had CHF.  She had and Echocardiogram and Cath. Done at [facility].  Patient's fluid is much better now and she's not having breathing problems.

## 2013-10-11 ENCOUNTER — Encounter: Admitting: Family Medicine

## 2013-10-20 ENCOUNTER — Encounter: Payer: Medicare Other | Admitting: Cardiovascular Disease

## 2013-10-20 ENCOUNTER — Ambulatory Visit (INDEPENDENT_AMBULATORY_CARE_PROVIDER_SITE_OTHER): Payer: Medicare Other | Admitting: Cardiovascular Disease

## 2013-10-20 ENCOUNTER — Encounter: Payer: Self-pay | Admitting: Cardiovascular Disease

## 2013-10-20 VITALS — BP 104/72 | HR 90 | Ht 60.0 in | Wt 125.8 lb

## 2013-10-20 DIAGNOSIS — J439 Emphysema, unspecified: Secondary | ICD-10-CM

## 2013-10-20 DIAGNOSIS — E875 Hyperkalemia: Secondary | ICD-10-CM

## 2013-10-20 DIAGNOSIS — E7800 Pure hypercholesterolemia, unspecified: Secondary | ICD-10-CM | POA: Diagnosis not present

## 2013-10-20 DIAGNOSIS — R0602 Shortness of breath: Secondary | ICD-10-CM

## 2013-10-20 DIAGNOSIS — I5022 Chronic systolic (congestive) heart failure: Secondary | ICD-10-CM | POA: Insufficient documentation

## 2013-10-20 DIAGNOSIS — R69 Illness, unspecified: Secondary | ICD-10-CM

## 2013-10-20 DIAGNOSIS — N19 Unspecified kidney failure: Secondary | ICD-10-CM | POA: Diagnosis not present

## 2013-10-20 DIAGNOSIS — Z7409 Other reduced mobility: Secondary | ICD-10-CM

## 2013-10-20 DIAGNOSIS — J438 Other emphysema: Secondary | ICD-10-CM

## 2013-10-20 DIAGNOSIS — I509 Heart failure, unspecified: Secondary | ICD-10-CM

## 2013-10-20 NOTE — Progress Notes (Signed)
Patient ID: Bridget Walker, female    DOB: 1925-07-03, 79 y.o.   MRN: [id number]  HPI Comments: Bridget Walker is a 79 year old woman with history of large hiatal hernia, underlying pulmonary disease/COPD on chronic prednisone, chronic pancreatitis who lives at [facility], recent admission to the hospital 10/01/2013 with discharge 10/04/2013 with shortness of breath. We will not involved in her care while she was in the hospital  and she is a new patient to our clinic.  Notes indicate there was a concern of CHF. Echocardiogram showed ejection fraction 30% with possible wall motion abnormality. She's given diuretics with subsequent acute renal failure.  creatinine increased from 1.15 up to 2.58. BUN increased from 28 to 47 . Diuretics were held, fluids initiated with improvement of her renal function back to her baseline . She had a cardiac catheterization that showed no significant CAD. This was done 10/04/2013 she was readmitted to the hospital 10/06/2013 for hypotension felt secondary to medications. Systolic pressure was 80. She was given fluids, Lasix was held temporarily. Hold parameters placed on her medications .  In followup today, she reports that her weight is slowly increasing. Leg edema also getting worse. Weight at the time of discharge was 118 pounds. She is currently 125 pounds. She is receiving Lasix 10 mg every other day with 20 mg pill once a week . She does report significant shortness of breath with any exertion   Echocardiogram from the hospital 10/01/2013 shows ejection fraction 30-35%, mention of possible anterior wall hypokinesis. This was not noted on cardiac catheterization.  EKG today shows normal sinus rhythm with rate 90 beats a minute, poor R-wave progression through the anterior precordial leads, left axis deviation    Outpatient Encounter Prescriptions as of 10/20/2013  Medication Sig  . acetaminophen (TYLENOL) 500 MG tablet Take by mouth. 1-2 tab every 6 hrs prn  .  alendronate (FOSAMAX) 70 MG tablet Take 1 tablet (70 mg total) by mouth every 7 (seven) days. Take with a full glass of water on an empty stomach.  . ALPRAZolam (XANAX) 0.25 MG tablet TAKE 1 TABLET BY MOUTH 3 TIMES A DAY AS NEEDED FOR ANXIETY  . aspirin EC 81 MG tablet Take 81 mg by mouth daily.  . Calcium Carbonate-Vitamin D (CALTRATE 600+D PO) Take 2 tablets by mouth daily.  Marland Kitchen esomeprazole (NEXIUM) 40 MG capsule TAKE 1 CAPSULE (40 MG TOTAL) BY MOUTH DAILY BEFORE BREAKFAST.  . furosemide (LASIX) 20 MG tablet Take 20 mg by mouth daily.  Marland Kitchen guaiFENesin (MUCINEX) 600 MG 12 hr tablet Take 600 mg by mouth daily. For cough and increased mucus  . lisinopril (PRINIVIL,ZESTRIL) 2.5 MG tablet Take 2.5 mg by mouth daily.  . megestrol (MEGACE) 40 MG/ML suspension Take 10 mLs (400 mg total) by mouth daily.  . metoprolol succinate (TOPROL-XL) 25 MG 24 hr tablet Take 12.5 mg by mouth daily.  . Pancrelipase, Lip-Prot-Amyl, (CREON PO) Take 1,500 Units by mouth 3 (three) times daily.   . polyethylene glycol powder (GLYCOLAX/MIRALAX) powder Take 17 g by mouth daily as needed.  . Potassium Chloride Crys CR (KLOR-CON M20 PO) Take by mouth daily.  . predniSONE (DELTASONE) 5 MG tablet Alternate 5 mg with 10 mg, every other day  . traMADol (ULTRAM) 50 MG tablet Take 50 mg by mouth daily as needed.  . Vitamin D, Ergocalciferol, (DRISDOL) 50000 UNITS CAPS capsule Take 100,000 Units by mouth every 7 (seven) days.  . [DISCONTINUED] traMADol (ULTRAM) 50 MG tablet Take 1 tablet (50  mg total) by mouth 2 (two) times daily as needed for pain.  . [DISCONTINUED] Vitamin D, Ergocalciferol, (DRISDOL) 50000 UNITS CAPS Take 1 capsule (50,000 Units total) by mouth every 7 (seven) days.  . [DISCONTINUED] calcitonin, salmon, (MIACALCIN/FORTICAL) 200 UNIT/ACT nasal spray Place 1 spray into the nose daily. Alternate nostrils each day  . [DISCONTINUED] calcium-vitamin D (OSCAL) 250-125 MG-UNIT per tablet Take 2 tablets by mouth daily.  .  [DISCONTINUED] Ibuprofen 200 MG CAPS Take by mouth. Take 1-2 tablets every 8 hours prn pain  . [DISCONTINUED] meloxicam (MOBIC) 7.5 MG tablet TAKE 1 TABLET DAILY      Review of Systems  Constitutional: Negative.        Weight gain  HENT: Negative.   Eyes: Negative.   Respiratory: Positive for shortness of breath.   Cardiovascular: Positive for leg swelling.  Gastrointestinal: Negative.   Endocrine: Negative.   Musculoskeletal: Positive for gait problem.  Skin: Negative.   Allergic/Immunologic: Negative.   Neurological: Negative.   Hematological: Negative.   Psychiatric/Behavioral: Negative.   All other systems reviewed and are negative.    BP 104/72  Pulse 90  Ht 5' (1.524 m)  Wt 125 lb 12 oz (57.04 kg)  BMI 24.56 kg/m2  Physical Exam  Nursing note and vitals reviewed. Constitutional: She is oriented to person, place, and time. She appears well-developed and well-nourished.  Unstable gait  HENT:  Head: Normocephalic.  Nose: Nose normal.  Mouth/Throat: Oropharynx is clear and moist.  Eyes: Conjunctivae are normal. Pupils are equal, round, and reactive to light.  Neck: Normal range of motion. Neck supple. No JVD present.  Cardiovascular: Normal rate, regular rhythm, S1 normal, S2 normal, normal heart sounds and intact distal pulses.  Exam reveals no gallop and no friction rub.   No murmur heard. Pulmonary/Chest: Effort normal and breath sounds normal. No respiratory distress. She has no wheezes. She has no rales. She exhibits no tenderness.  Abdominal: Soft. Bowel sounds are normal. She exhibits no distension. There is no tenderness.  Musculoskeletal: Normal range of motion. She exhibits no edema and no tenderness.  Lymphadenopathy:    She has no cervical adenopathy.  Neurological: She is alert and oriented to person, place, and time. Coordination normal.  Skin: Skin is warm and dry. No rash noted. No erythema.  Psychiatric: She has a normal mood and affect. Her  behavior is normal. Judgment and thought content normal.    Assessment and Plan

## 2013-10-20 NOTE — Assessment & Plan Note (Signed)
History of hyperlipidemia. Currently not on a statin. Legs are very weak

## 2013-10-20 NOTE — Assessment & Plan Note (Signed)
Legs are very weak. She is in a skilled nursing facility, does participate in physical therapy but does get tired and very short of breath with activity

## 2013-10-20 NOTE — Patient Instructions (Addendum)
Please take lasix 20 mg daily Hold the lasix for weight less than 121 pounds Take  potassium 10 meq only with lasix  We will check a BMP today  Please call the office for weight 130 pounds or more  Please call us if you have new issues that need to be addressed before your next appt.  Your physician wants you to follow-up in: 1 month.

## 2013-10-20 NOTE — Assessment & Plan Note (Signed)
Recent 7 pound weight gain in the past 2 weeks while at [facility] on low-dose Lasix every other day. We have suggested she start Lasix 20 mg daily with potassium with hold parameters to hold the Lasix for weight less than 121 pounds. She presented to [facility] with weight 118 pounds.

## 2013-10-20 NOTE — Assessment & Plan Note (Signed)
Mild increase in her shortness of breath. Gets worse when she is anxious or tired or pushes herself hard. Is not on oxygen currently but does have access to oxygen if needed in the skilled nursing facility. We will watch her weight closely, increase her Lasix with hold parameters for weight less than 121 pounds

## 2013-10-21 LAB — BASIC METABOLIC PANEL
BUN: 30 mg/dL — ABNORMAL HIGH (ref 8–27)
Calcium: 9.6 mg/dL (ref 8.6–10.2)
GFR calc non Af Amer: 50 mL/min/{1.73_m2} — ABNORMAL LOW (ref >59)
Glucose: 114 mg/dL — ABNORMAL HIGH (ref 65–99)
Potassium: 5.4 mmol/L — ABNORMAL HIGH (ref 3.5–5.2)

## 2013-10-22 ENCOUNTER — Other Ambulatory Visit: Payer: Self-pay | Admitting: Family Medicine

## 2013-10-22 DIAGNOSIS — I5022 Chronic systolic (congestive) heart failure: Secondary | ICD-10-CM

## 2013-10-22 DIAGNOSIS — F068 Other specified mental disorders due to known physiological condition: Secondary | ICD-10-CM

## 2013-10-22 DIAGNOSIS — J449 Chronic obstructive pulmonary disease, unspecified: Secondary | ICD-10-CM | POA: Diagnosis not present

## 2013-10-22 DIAGNOSIS — M549 Dorsalgia, unspecified: Secondary | ICD-10-CM

## 2013-10-22 DIAGNOSIS — J4489 Other specified chronic obstructive pulmonary disease: Secondary | ICD-10-CM

## 2013-10-25 NOTE — Telephone Encounter (Signed)
I refilled electronically 

## 2013-10-25 NOTE — Telephone Encounter (Signed)
Electronic refill request, last appt for osteoporosis you started her of fosamax, I couldn't tell if pt was still on med but not on current med list, please advise

## 2013-10-26 ENCOUNTER — Other Ambulatory Visit: Payer: Self-pay | Admitting: *Deleted

## 2013-10-26 MED ORDER — POTASSIUM CHLORIDE CRYS ER 20 MEQ PO TBCR: 20.0000 meq | EXTENDED_RELEASE_TABLET | Freq: Every day | ORAL | Status: DC

## 2013-10-26 MED ORDER — FUROSEMIDE 20 MG PO TABS: 20.0000 mg | ORAL_TABLET | Freq: Every day | ORAL | Status: DC

## 2013-10-26 MED ORDER — METOPROLOL SUCCINATE ER 25 MG PO TB24: 12.5000 mg | ORAL_TABLET | Freq: Every day | ORAL | Status: DC

## 2013-10-26 MED ORDER — LISINOPRIL 2.5 MG PO TABS: 2.5000 mg | ORAL_TABLET | Freq: Every day | ORAL | Status: DC

## 2013-10-26 NOTE — Telephone Encounter (Signed)
Requested Prescriptions   Signed Prescriptions Disp Refills  . metoprolol succinate (TOPROL-XL) 25 MG 24 hr tablet 45 tablet 3    Sig: Take 0.5 tablets (12.5 mg total) by mouth daily.    Authorizing Provider: Antonieta Iba    Ordering User: Shawnie Dapper, Vickki Igou C  . furosemide (LASIX) 20 MG tablet 90 tablet 3    Sig: Take 1 tablet (20 mg total) by mouth daily.    Authorizing Provider: Antonieta Iba    Ordering User: Shawnie Dapper, Torah Pinnock C  . potassium chloride SA (KLOR-CON M20) 20 MEQ tablet 90 tablet 3    Sig: Take 1 tablet (20 mEq total) by mouth daily.    Authorizing Provider: Antonieta Iba    Ordering User: Shawnie Dapper, Dominga Mcduffie C  . lisinopril (PRINIVIL,ZESTRIL) 2.5 MG tablet 90 tablet 3    Sig: Take 1 tablet (2.5 mg total) by mouth daily.    Authorizing Provider: Antonieta Iba    Ordering User: Kendrick Fries

## 2013-11-01 ENCOUNTER — Telehealth: Payer: Self-pay | Admitting: *Deleted

## 2013-11-01 ENCOUNTER — Other Ambulatory Visit: Payer: Self-pay | Admitting: *Deleted

## 2013-11-01 NOTE — Telephone Encounter (Signed)
Message copied by Fransico Setters on Mon Nov 01, 2013 10:53 AM ------      Message from: Antonieta Iba      Created: Sun Oct 31, 2013  9:30 PM       Potassium dose elevated      Would hold potassium for three days, then cut the dose in 1/2 daily      Recheck BMP in 2 weeks ------

## 2013-11-02 NOTE — Telephone Encounter (Signed)
Message copied by Marilynne Halsted on Tue Nov 02, 2013 12:59 PM ------      Message from: Antonieta Iba      Created: Sun Oct 31, 2013  9:30 PM       Potassium dose elevated      Would hold potassium for three days, then cut the dose in 1/2 daily      Recheck BMP in 2 weeks ------

## 2013-11-03 ENCOUNTER — Telehealth: Payer: Self-pay

## 2013-11-03 NOTE — Telephone Encounter (Signed)
Left message for pt to call back  °

## 2013-11-03 NOTE — Telephone Encounter (Signed)
Message copied by Marilynne Halsted on Wed Nov 03, 2013  3:19 PM ------      Message from: Antonieta Iba      Created: Sun Oct 31, 2013  9:30 PM       Potassium dose elevated      Would hold potassium for three days, then cut the dose in 1/2 daily      Recheck BMP in 2 weeks ------

## 2013-11-05 NOTE — Addendum Note (Signed)
Addended by: Murrell Redden E on: 11/05/2013 01:01 PM   Modules accepted: Orders

## 2013-11-05 NOTE — Progress Notes (Signed)
Spoke w/daughter who is POA and advised her of elevated k+ and to stop for 3 days and then cut in 1/2.  Will repeat BMP on visit 12/15.  She verbalizes understanding. Pt is in health facility.

## 2013-11-05 NOTE — Telephone Encounter (Signed)
°  Follow up  Pt's daughter called back and informed us    [facility]  has to be called [phone]  ASAP and ask to speak to pt's RN there. To change med.

## 2013-11-08 ENCOUNTER — Telehealth: Payer: Self-pay

## 2013-11-08 DIAGNOSIS — M19049 Primary osteoarthritis, unspecified hand: Secondary | ICD-10-CM | POA: Diagnosis not present

## 2013-11-08 DIAGNOSIS — I959 Hypotension, unspecified: Secondary | ICD-10-CM | POA: Diagnosis not present

## 2013-11-08 DIAGNOSIS — I4949 Other premature depolarization: Secondary | ICD-10-CM | POA: Diagnosis not present

## 2013-11-08 DIAGNOSIS — F411 Generalized anxiety disorder: Secondary | ICD-10-CM | POA: Diagnosis not present

## 2013-11-08 DIAGNOSIS — M6281 Muscle weakness (generalized): Secondary | ICD-10-CM | POA: Diagnosis not present

## 2013-11-08 DIAGNOSIS — J438 Other emphysema: Secondary | ICD-10-CM | POA: Diagnosis not present

## 2013-11-08 DIAGNOSIS — K219 Gastro-esophageal reflux disease without esophagitis: Secondary | ICD-10-CM | POA: Diagnosis not present

## 2013-11-08 DIAGNOSIS — I5021 Acute systolic (congestive) heart failure: Secondary | ICD-10-CM | POA: Diagnosis not present

## 2013-11-08 DIAGNOSIS — I509 Heart failure, unspecified: Secondary | ICD-10-CM | POA: Diagnosis not present

## 2013-11-08 NOTE — Telephone Encounter (Signed)
Spoke w/ pt's nurse, Marcelino Duster.  Informed her to hold K+ x 3 days and then 1/2 dose, recheck BMP at their facility in 2 weeks and send [place] the results. She verbalizes understanding and is agreeable to plan.

## 2013-11-08 NOTE — Telephone Encounter (Signed)
Spoke w/ Marcelino Duster.  She needed clarification the pt is to have Basic Metabolic Panel drawn.

## 2013-11-08 NOTE — Telephone Encounter (Signed)
Needs clarification on a lab order (BMP). Please call.

## 2013-11-22 ENCOUNTER — Telehealth: Payer: Self-pay | Admitting: Internal Medicine

## 2013-11-22 ENCOUNTER — Telehealth: Payer: Self-pay

## 2013-11-22 ENCOUNTER — Ambulatory Visit: Payer: Medicare Other | Admitting: Cardiovascular Disease

## 2013-11-22 DIAGNOSIS — I5021 Acute systolic (congestive) heart failure: Secondary | ICD-10-CM | POA: Diagnosis not present

## 2013-11-22 NOTE — Telephone Encounter (Signed)
Spoke w/ Alona Bene.  She states that pt "feels that she needs oxygen" but sats remain > 90% and does not qualify for O2. Reports that pt was previously on O2 but not anymore. Alona Bene would like to know if there is a way for Dr. Mariah Milling to order pt's O2 since she was recently diagnosed w/ CHF. Advised that pt already has a pulmonologist and they would need to address her O2 needs. Alona Bene reports that she was hoping there was something else we knew about to get pt's O2. She states that she will call pulmonology and speak with them about any additional testing they can do for pt. She will call with any other questions or concerns.

## 2013-11-22 NOTE — Telephone Encounter (Signed)
Pt daughter states pt will move back to assisted living this week, states Dr. Mariah Milling said she may need oxygen with her CHF, pt daughter would like to talk about a rx for her oxygen, pt is moving this week from [facility], pt daughter would like BMET orders faxed today to [facility] so they can be drawn before pt is moved. Please call.

## 2013-11-22 NOTE — Telephone Encounter (Signed)
Left message for [name] to call back.

## 2013-11-22 NOTE — Telephone Encounter (Signed)
Spoke with pt's daughter Alona Bene She states that the pt is requesting o2 for her DOE  She is about to be sent home from a nursing home and will not have accesss to their o2 anymore I advised she will have to be qualified for o2 here in the office then  No openings for the rest of the month Please advise when pt can be worked in thanks!

## 2013-11-22 NOTE — Telephone Encounter (Signed)
Could work in with TP?

## 2013-11-23 DIAGNOSIS — I509 Heart failure, unspecified: Secondary | ICD-10-CM

## 2013-11-23 DIAGNOSIS — G8929 Other chronic pain: Secondary | ICD-10-CM

## 2013-11-23 DIAGNOSIS — F0390 Unspecified dementia without behavioral disturbance: Secondary | ICD-10-CM

## 2013-11-23 DIAGNOSIS — I10 Essential (primary) hypertension: Secondary | ICD-10-CM | POA: Diagnosis not present

## 2013-11-23 NOTE — Telephone Encounter (Signed)
i psoke with daughter. She had already scheduled an appt. Nothing further needed

## 2013-11-23 NOTE — Telephone Encounter (Signed)
Returning call can be reached at [phone].Bridget Walker

## 2013-11-23 NOTE — Telephone Encounter (Signed)
lmtcb x1 

## 2013-11-25 ENCOUNTER — Ambulatory Visit (INDEPENDENT_AMBULATORY_CARE_PROVIDER_SITE_OTHER): Payer: Medicare Other | Admitting: Adult Health

## 2013-11-25 ENCOUNTER — Encounter: Payer: Self-pay | Admitting: Adult Health

## 2013-11-25 VITALS — BP 112/74 | HR 74 | Temp 97.0°F | Ht 60.0 in | Wt 131.0 lb

## 2013-11-25 DIAGNOSIS — J438 Other emphysema: Secondary | ICD-10-CM | POA: Diagnosis not present

## 2013-11-25 DIAGNOSIS — J439 Emphysema, unspecified: Secondary | ICD-10-CM

## 2013-11-25 NOTE — Patient Instructions (Signed)
We are setting you up for an Overnight oximetry test .  Continue on current regimen .  Follow up Dr. Maple Hudson  In 6 weeks and As needed

## 2013-11-29 ENCOUNTER — Telehealth: Payer: Self-pay | Admitting: Internal Medicine

## 2013-11-29 NOTE — Telephone Encounter (Signed)
I spoke with mandy. She wanted to make sure she is not suppose to set up pt ONO. I advised her we ordered this for pt and we will set this up. Nothing further needed

## 2013-11-30 NOTE — Progress Notes (Signed)
06/08/12- 20 yoF former smoker  referred by Dr Benay Spice. Pt states having Increase SOB upon activity Productive cough in am . PCP Dr Loura Pardon    Son here She is being monitored for possible pancreatic tumor and has history of a biliary stent. For about 3 months she has been aware of dyspnea on exertion while walking, relieved by sitting. She had a "mild outpatient pneumonia" treated 2 weeks ago. She had been exercising at the [facility]. She was noting more shortness of breath with exertion after bad cold and some shingles. Weight had been going down. She has been drinking diet supplements to gain weight. She denies cough, wheeze, chest pain. She does hear a rattle but coughs out nothing. Legs feel weak. She denies fever, blood or swollen glands. She denies past history of asthma or pneumonia and she denies heart disease, reflux, choking with meals. She is widowed, living in a retirement center. 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 Father died with pneumonia, mother died with MI. CT chest was concerning for density, more than a "mild pneumonia". CT chest 05/07/12- reviewed with her IMPRESSION:  No evidence of significant pulmonary embolus. The extensive  consolidation of the right lower lung and right middle lung with  patchy areas of nodular infiltration in the left lower lung.  Changes may represent pneumonia. No definite evidence of a central  obstructing lesion although the distal right main stem bronchus is  somewhat narrowed and a cold mass may be obscured by the  consolidation. Recommend follow up after resolution of acute  symptoms. Large esophageal hiatal hernia with mild esophageal  dilatation. Esophagitis versus dysmotility. Biliary wall stent  and biliary gas demonstrated in the upper abdomen.  Original Report Authenticated By: Neale Burly, M.D.   CXR 06/03/12- IMPRESSION:  1. Multi focal  right lung airspace disease has not significantly  changed since 05/07/2012.  2. Interval increased patchy opacity at the left lung base.  Original Report Authenticated By: Randall An, M.D.   07/07/12- 56 yoF former smoker followed for pneumonia complicated by large hiatal hernia, pancreatic cancer. Dr Sherrill/ Onc> Hospice. Daughter and son here today. She is now followed by hospice because of pancreatic cancer with biliary stent. Notices dyspnea standing, and cough as she gets out of bed and starts moving. Mostly clear phlegm, occasional scant green. Wakes herself coughing. Avelox did not help. We reviewed the CT scan images again, nothing large hiatal hernia full of food and parenchymal consolidation consistent with pneumonia and especially consistent with aspiration pneumonia. Discussed possibility of organizing pneumonia.  07/27/12-  55 yoF former smoker followed for pneumonia complicated by large hiatal hernia, pancreatic cancer.  Dtr here. Sputum CX 06/10/12- Nl flora, neg for AFB. Dr Ardis Hughs has indicated steroid trial, aimed at organizing pneumonia, is ok from pancreas standpoint.  Very short of breath after shower. Home oxygen helps, provided by Hospice through? [employer]. No pain, fever, sweat. Scant clear mucus. Flutter device helps shortness of breath after use, but does not generate productive cough. She denies any choking or strangling with meals or during sleep. CXR I 07/27/12- images reviewed with them. IMPRESSION:  1. Worsening asymmetric airspace disease involving right lung  greater than left, suspicious for pneumonia.  2. Stable mild cardiomegaly.  3. Hiatal hernia again noted.  Original Report Authenticated By: Marlaine Hind, M.D.   08/13/12- 22 yoF former smoker followed for  pneumonia complicated by large hiatal hernia, pancreatic cancer.  Dtr here.  Pt reports legs no longer hurt, no more coughing in the morning, wearing O2 most of the time--not today. Has continued  maintenance prednisone 20 mg daily and recognizes breathing is better but it gives her some insomnia. Has elevated head of bed. Using oxygen at 2 L only as needed now. Not coughing at all. Wants flu vax CXR 08/13/12- IMPRESSION:  Improved right lower lobe and left lung base infiltrates.  Underlying COPD. No new abnormalities.   10/02/12- 52 yoF former smoker followed for pneumonia complicated by large hiatal hernia, pancreatic cancer.  Dtr here. Follow-Up  - has been feeling very anxious lately. Hospice nurse thinks that she needs something for anxiety. Prednisone seemed to reduce the amount of parenchymal density, consistent with impression that it was organizing pneumonia. She is followed with a hospice nurse. Reducing prednisone to 5 mg daily left her too short of breath, so  she went back to 10 mg daily. Less morning cough. Recent night sweats. Low back pain sometimes radiates to abdomen. CXR 10/02/12- we reviewed the images together IMPRESSION:  Improved airspace disease in the right mid and lower lung zones.  Persistent opacity is noted  Stable spiculated opacity at the left lung base. Underlying  malignancy cannot be excluded.  Original Report Authenticated By: Donavan Burnet, M.D.   03/22/13- 68 yoF former smoker followed for pneumonia complicated by large hiatal hernia.  Dtr here. FOLLOWS FOR: breathing doing good as long as wearing O2; daughter states that pt is still breathy; hospice is pulling out as pt does not have pancreatic cancer; will need order for O2 through DME company since hospice is no longer going to help with this.  Has been using prednisone 10 mg alternating with 5 mg every other day. Makes her anxious. Mentions pains and right rib cage that seem to come and go.  05/17/13- 65 yoF former smoker followed for Hx pneumonia, COPD/E,  complicated by large hiatal hernia.  Dtr here. FOLLOWS FOR: continues to use O2 QHS. Breathing is doing okay O2 2-4L/ [facility], just for  sleep and as needed, but she doesn't think she needs much. Moving to assisted living. Per the right side of her back exercising, helped by physical therapy Prednisone now reduced to 5 mg daily. CXR 03/23/13-Stable spiculated opacity at the left lung base. Large hiatal hernia. IMPRESSION:  No acute abnormality is noted.  Original Report Authenticated By: Alcide Clever, M.D  08/19/13- 60 yoF former smoker followed for Hx pneumonia, COPD/E, lung nodule,  complicated by large hiatal hernia.  Dtr here FOLLOWS FOR: unsure if needs to go back to O2; uses her flutter device and feels that  helps some; had called about SOB and had prednisone increased. Cut off oxygen  Then tapered prednisone. Started getting more short of breath so she increased the prednisone back to 10 mg daily for the past month. Occasional dyspnea on exertion in assisted living but now feels comfortable. //needs f/u CXR for lung nodule//  11/25/13 Follow up COPD  Has to re- qualify for O2 -- did not qualify today here in the office.  No desats with walking.  Says overall breathing is the same . No flare in cough, wheezing or dyspnea. Gets winded easily.  On prednisone 5mg  alterating with 10mg  daily    ROS-see HPI Constitutional:   No-  weight loss, night sweats, fevers, chills, fatigue, lassitude. HEENT:   No-  headaches, difficulty swallowing, tooth/dental  problems, sore throat,       No-  sneezing, itching, ear ache, nasal congestion, post nasal drip,  CV:  No-   chest pain, orthopnea, PND, swelling in lower extremities, anasarca,dizziness, palpitations Resp: +shortness of breath with exertion or at rest.              No- productive cough,  No non-productive cough,  No- coughing up of blood.              No-   change in color of mucus.  No- wheezing.   Skin: No-   rash or lesions. GI:  No-   heartburn, indigestion, abdominal pain, nausea, vomiting,  GU:  MS:  No-   joint pain or swelling.  +back pain Neuro-     nothing  unusual Psych:  No- change in mood or affect.  depression or +anxiety.  No memory loss.  OBJ- Physical Exam General- Alert, Oriented, Affect-appropriate, Distress- none acute, thin elderly woman Skin- rash-none, lesions- none, excoriation- none Lymphadenopathy- none Head- atraumatic            Eyes- Gross vision intact, PERRLA, conjunctivae and secretions clear            Ears- Hearing, canals-normal            Nose- Clear, no-Septal dev, mucus, polyps, erosion, perforation             Throat- Mallampati II , mucosa clear , drainage- none, tonsils- atrophic. Own teeth Neck- flexible , trachea midline, no stridor , thyroid nl, carotid no bruit Chest - symmetrical excursion , unlabored           Heart/CV- RRR/few extra beats , no murmur , no gallop  , no rub, nl s1 s2                           - JVD- none , edema- none, stasis changes- none, varices- none           Lung- . Clear, rub- none, no cough or wheeze           Chest wall-  Abd-  Br/ Gen/ Rectal- Not done, not indicated Extrem- cyanosis- none, clubbing, none, atrophy- none, strength- nl, using a walker Neuro- grossly intact to observation

## 2013-11-30 NOTE — Assessment & Plan Note (Signed)
Continue on current regimen  No exertional desats Set up for home ONO to check for nocturnal desats.   Plan  We are setting you up for an Overnight oximetry test .  Continue on current regimen .  Follow up Dr. Maple Hudson  In 6 weeks and As needed

## 2013-12-06 ENCOUNTER — Telehealth: Payer: Self-pay | Admitting: Family Medicine

## 2013-12-06 NOTE — Telephone Encounter (Signed)
Pt' daughter left v/m requesting new order for Creon to [facility]; pt is presently taking Creon 1500 units three times a day; Alona Bene said [facility] do not have the 1500 units of Creon but they do have Creon 5000 units, 10000 units and 95284 units. Alona Bene wants to know if pts dosage could be changed so pt can get med thru [facility] since med is free from [facility]. Alona Bene also request refill of Tramadol and wants to pick up Tramadol rx so Alona Bene can mail in to [facility].Alona Bene request cb [phone] or [phone].

## 2013-12-06 NOTE — Telephone Encounter (Signed)
Did she get a 3 month supply of tramadol on 10/22? - should not be out -let me know if I am wrong or missing something, thanks

## 2013-12-07 ENCOUNTER — Ambulatory Visit: Payer: Medicare Other | Admitting: Cardiovascular Disease

## 2013-12-07 MED ORDER — TRAMADOL HCL 50 MG PO TABS: 50.0000 mg | ORAL_TABLET | Freq: Two times a day (BID) | ORAL | Status: DC

## 2013-12-07 MED ORDER — PANCRELIPASE (LIP-PROT-AMYL) 12000-38000 UNITS PO CPEP: 1.0000 | ORAL_CAPSULE | Freq: Three times a day (TID) | ORAL | Status: DC

## 2013-12-07 NOTE — Telephone Encounter (Signed)
Rxs faxed and left voicemail with local pharmacy requesting to cancel any remaining refills of tramadol

## 2013-12-07 NOTE — Telephone Encounter (Signed)
I spoke to patient's daughter and she states she is wanting to get her mother's Rx through [facility] because  It will be free. This will help being that she is living at [facility] now. Pt states [facility] will require a form to befilled out if you will approve the Rx for Tramadol to be filled through them. She also states the Creon was on back order but is only available in 5,000--10,000--12,000 and she wants to know if the dosage can be adjusted to be able to get these available doses. Pt currently is taking 1500.--please advise--

## 2013-12-07 NOTE — Telephone Encounter (Signed)
Thanks- can refill the vit D times one

## 2013-12-07 NOTE — Telephone Encounter (Signed)
Last filled 09/28/13 from here was 15 day supply- However the pharmacy states the pt has 30 day BID #60 Rx that has not been picked up from another provider. I left message on voicemail for pt's daughter to call back in reference to the 30 day Rx that is active and can be picked up.

## 2013-12-07 NOTE — Telephone Encounter (Signed)
I will print out her px and put in IN box Please cancel her remaining tramadol at her pharmacy

## 2013-12-13 NOTE — Telephone Encounter (Signed)
[name] nurse with [facility] left v/m requesting cb about pt using mail order for long term use of Tramadol. Unable to reach [place] by phone.Please advise.

## 2013-12-14 ENCOUNTER — Other Ambulatory Visit: Payer: Self-pay | Admitting: Oncology

## 2013-12-14 NOTE — Telephone Encounter (Signed)
Left detailed message on voicemail of [place] her that Rx was faxed on 12/03/13 and any remaining refills of Tramadol were cancelled at the local pharmacy.

## 2013-12-15 ENCOUNTER — Encounter: Payer: Self-pay | Admitting: Cardiovascular Disease

## 2013-12-15 ENCOUNTER — Other Ambulatory Visit: Payer: Self-pay | Admitting: Family Medicine

## 2013-12-15 ENCOUNTER — Ambulatory Visit (INDEPENDENT_AMBULATORY_CARE_PROVIDER_SITE_OTHER): Payer: Medicare Other | Admitting: Cardiovascular Disease

## 2013-12-15 VITALS — BP 113/81 | HR 99 | Ht 60.0 in | Wt 127.8 lb

## 2013-12-15 DIAGNOSIS — I493 Ventricular premature depolarization: Secondary | ICD-10-CM

## 2013-12-15 DIAGNOSIS — E7800 Pure hypercholesterolemia, unspecified: Secondary | ICD-10-CM

## 2013-12-15 DIAGNOSIS — I5022 Chronic systolic (congestive) heart failure: Secondary | ICD-10-CM | POA: Diagnosis not present

## 2013-12-15 DIAGNOSIS — I509 Heart failure, unspecified: Secondary | ICD-10-CM

## 2013-12-15 DIAGNOSIS — I4949 Other premature depolarization: Secondary | ICD-10-CM | POA: Diagnosis not present

## 2013-12-15 DIAGNOSIS — J439 Emphysema, unspecified: Secondary | ICD-10-CM

## 2013-12-15 DIAGNOSIS — J438 Other emphysema: Secondary | ICD-10-CM

## 2013-12-15 MED ORDER — ALENDRONATE SODIUM 70 MG PO TABS: 70.0000 mg | ORAL_TABLET | ORAL | Status: DC

## 2013-12-15 MED ORDER — METOPROLOL SUCCINATE ER 25 MG PO TB24: 12.5000 mg | ORAL_TABLET | Freq: Every day | ORAL | Status: DC

## 2013-12-15 MED ORDER — POTASSIUM CHLORIDE ER 10 MEQ PO TBCR: 5.0000 meq | EXTENDED_RELEASE_TABLET | Freq: Once | ORAL | Status: DC

## 2013-12-15 MED ORDER — LISINOPRIL 2.5 MG PO TABS: 2.5000 mg | ORAL_TABLET | Freq: Every day | ORAL | Status: DC

## 2013-12-15 MED ORDER — FUROSEMIDE 20 MG PO TABS: 20.0000 mg | ORAL_TABLET | Freq: Every day | ORAL | Status: DC

## 2013-12-15 MED ORDER — TRAMADOL HCL 50 MG PO TABS: 50.0000 mg | ORAL_TABLET | Freq: Two times a day (BID) | ORAL | Status: DC

## 2013-12-15 NOTE — Telephone Encounter (Signed)
Pt's daughter called and said it's going to be a few days before they can get the Tramadol Rx from [facility] and would like a Rx sent to her local pharmacy for a few pills until she can get the Rx in the mail, please advise   Daughter also request a refill of the [facility]ax [facility] (done)

## 2013-12-15 NOTE — Telephone Encounter (Signed)
Rx called in as prescribed and pt's daughter notified  

## 2013-12-15 NOTE — Patient Instructions (Signed)
You are doing well. No medication changes were made.  Please call us if you have new issues that need to be addressed before your next appt.  Your physician wants you to follow-up in: 6 months.  You will receive a reminder letter in the mail two months in advance. If you don't receive a letter, please call our office to schedule the follow-up appointment.   

## 2013-12-15 NOTE — Telephone Encounter (Signed)
Px written for call in   

## 2013-12-15 NOTE — Assessment & Plan Note (Signed)
High cholesterol she has recent cardiac catheterization showing no significant CAD. Given leg weakness, did not need to be very aggressive with her lipids with a statin

## 2013-12-15 NOTE — Progress Notes (Signed)
Patient ID: Bridget Walker, female    DOB: 1925-02-08, 80 y.o.   MRN: [id number]  HPI Comments: Bridget Walker is a 80 year old woman with history of large hiatal hernia, underlying pulmonary disease/COPD on chronic prednisone, chronic pancreatitis who lives at [facility],  admission to the hospital 10/01/2013 with discharge 10/04/2013 with shortness of breath. Cardiac catheterization showed no significant CAD  Notes indicate there was a concern of CHF. Echocardiogram showed ejection fraction 30% with possible wall motion abnormality. She was given diuretics with subsequent acute renal failure.  creatinine increased from 1.15 up to 2.58. BUN increased from 28 to 47 . Diuretics were held, fluids initiated with improvement of her renal function back to her baseline . She had a cardiac catheterization that showed no significant CAD. This was done 10/04/2013 she was readmitted to the hospital 10/06/2013 for hypotension felt secondary to medications. Systolic pressure was 80. She was given fluids, Lasix was held temporarily. Hold parameters placed on her medications .  In followup today, she is [facility] [place] care and moving back to assisted living. She takes Lasix 20 mg daily. Her weight has been relatively stable at 126 pounds. Blood pressure has been running low but she denies any dizziness or orthostatic type symptoms. Minimal lower extremity edema. She does report having shortness of breath with exertion.  Echocardiogram from the hospital 10/01/2013 shows ejection fraction 30-35%, mention of possible anterior wall hypokinesis. This was not noted on cardiac catheterization.  EKG today shows normal sinus rhythm with rate 99 beats a minute, poor R-wave progression through the anterior precordial leads, left axis deviation    Outpatient Encounter Prescriptions as of 12/15/2013  Medication Sig  . acetaminophen (TYLENOL) 500 MG tablet Take by mouth. 1-2 tab every 6 hrs prn  . ALPRAZolam (XANAX) 0.5 MG tablet  Take 0.5 mg by mouth 3 (three) times daily as needed for anxiety.  Marland Kitchen aspirin EC 81 MG tablet Take 81 mg by mouth daily.  . Calcium Carbonate-Vitamin D (CALTRATE 600+D PO) Take 2 tablets by mouth daily.  Marland Kitchen esomeprazole (NEXIUM) 40 MG capsule TAKE 1 CAPSULE (40 MG TOTAL) BY MOUTH DAILY BEFORE BREAKFAST.  . furosemide (LASIX) 20 MG tablet Take 1 tablet (20 mg total) by mouth daily.  Marland Kitchen guaiFENesin (MUCINEX) 600 MG 12 hr tablet Take 600 mg by mouth as needed. For cough and increased mucus  . lisinopril (PRINIVIL,ZESTRIL) 2.5 MG tablet Take 1 tablet (2.5 mg total) by mouth daily.  . megestrol (MEGACE) 40 MG/ML suspension Take 10 mLs (400 mg total) by mouth daily.  . metoprolol succinate (TOPROL-XL) 25 MG 24 hr tablet Take 0.5 tablets (12.5 mg total) by mouth daily.  . Pancrelipase, Lip-Prot-Amyl, 3000-10000 UNITS CPEP Take by mouth 3 (three) times daily.  . polyethylene glycol powder (GLYCOLAX/MIRALAX) powder Take 17 g by mouth daily as needed.  . potassium chloride (K-DUR) 10 MEQ tablet 1/2 tab by mouth once daily  . predniSONE (DELTASONE) 5 MG tablet Alternate 5 mg with 10 mg, every other day  . traMADol (ULTRAM) 50 MG tablet Take 1 tablet (50 mg total) by mouth 2 (two) times daily.  . Vitamin D, Ergocalciferol, (DRISDOL) 50000 UNITS CAPS capsule TAKE 1 CAPSULE EVERY 7 DAYS     Review of Systems  Constitutional: Negative.   HENT: Negative.   Eyes: Negative.   Respiratory: Positive for shortness of breath.   Cardiovascular: Negative.   Gastrointestinal: Negative.   Endocrine: Negative.   Musculoskeletal: Positive for gait problem.  Skin: Negative.  Allergic/Immunologic: Negative.   Neurological: Negative.   Hematological: Negative.   Psychiatric/Behavioral: Negative.   All other systems reviewed and are negative.    BP 113/81  Pulse 99  Ht 5' (1.524 m)  Wt 127 lb 12 oz (57.947 kg)  BMI 24.95 kg/m2  Physical Exam  Nursing note and vitals reviewed. Constitutional: She is  oriented to person, place, and time. She appears well-developed and well-nourished.  Unstable gait  HENT:  Head: Normocephalic.  Nose: Nose normal.  Mouth/Throat: Oropharynx is clear and moist.  Eyes: Conjunctivae are normal. Pupils are equal, round, and reactive to light.  Neck: Normal range of motion. Neck supple. No JVD present.  Cardiovascular: Normal rate, regular rhythm, S1 normal, S2 normal, normal heart sounds and intact distal pulses.  Exam reveals no gallop and no friction rub.   No murmur heard. Pulmonary/Chest: Effort normal and breath sounds normal. No respiratory distress. She has no wheezes. She has no rales. She exhibits no tenderness.  Abdominal: Soft. Bowel sounds are normal. She exhibits no distension. There is no tenderness.  Musculoskeletal: Normal range of motion. She exhibits no edema and no tenderness.  Lymphadenopathy:    She has no cervical adenopathy.  Neurological: She is alert and oriented to person, place, and time. Coordination normal.  Skin: Skin is warm and dry. No rash noted. No erythema.  Psychiatric: She has a normal mood and affect. Her behavior is normal. Judgment and thought content normal.    Assessment and Plan

## 2013-12-15 NOTE — Assessment & Plan Note (Signed)
She appears be doing well, weight is stable, appears euvolemic. No changes to her medications

## 2013-12-15 NOTE — Assessment & Plan Note (Signed)
She does have some baseline shortness of breath with exertion. This could be secondary to mild COPD, underlying chronic systolic cardiac dysfunction

## 2013-12-16 ENCOUNTER — Telehealth: Payer: Self-pay | Admitting: Internal Medicine

## 2013-12-16 NOTE — Telephone Encounter (Signed)
Relevant patient education assigned to patient using Emmi. ° °

## 2013-12-22 ENCOUNTER — Telehealth: Payer: Self-pay | Admitting: Internal Medicine

## 2013-12-22 DIAGNOSIS — J439 Emphysema, unspecified: Secondary | ICD-10-CM

## 2013-12-22 NOTE — Telephone Encounter (Signed)
Spoke with daughter. She is checking on status on ONO to be set up. Pt is currently living at [facility] [place] [facility]. Pt does not current;y have DME. She would also like to know who we would be setting this up through as well and when it will be done. Please advise PCC's. Order is in [place]. Thanks  Daughter cell #: [phone]

## 2013-12-22 NOTE — Telephone Encounter (Signed)
Order has been faxed to [facility]. Daughter, [name] is aware. Nothing else needed. Rhonda J Cobb

## 2013-12-22 NOTE — Telephone Encounter (Signed)
Murray [phone]) and [facility] for Farrel Conners (nursing) to return my call in reference to ONO and if they have one DME that [facility] uses. Waiting on return call from [facility] to advise where to send the order for ONO and [facility] uses as oxygen provider. Rhonda J Cobb

## 2013-12-22 NOTE — Telephone Encounter (Signed)
Sharee Pimple with [facility] called and stated that [facility] doesn't have one particular dme that supplies patient's o2 or arranges for ONO's. Called and spoke with pt's daughter, [name] and advised her that we would send order to [facility] to arrange the ONO. [facility] will contact [name] to arrange and bring ONO out to patient. Once test has been completed, Dr. Annamaria Boots or his nurse will call with results, if O2 is needed, [facility] will supply. Rhonda J Cobb

## 2013-12-24 ENCOUNTER — Telehealth: Payer: Self-pay | Admitting: Internal Medicine

## 2013-12-24 NOTE — Telephone Encounter (Signed)
Spoke with [name]. She reports once we get pt ono results if we would call her since she is POA of pt. I advised will make note of this. Nothing further needed

## 2013-12-29 ENCOUNTER — Telehealth: Payer: Self-pay | Admitting: Internal Medicine

## 2013-12-29 NOTE — Telephone Encounter (Signed)
PCC's I can't tell where order for ONO was sent. Please advise so we can call and see what the status of this is. thanks

## 2013-12-30 NOTE — Telephone Encounter (Signed)
Order was sent to [facility]

## 2013-12-31 DIAGNOSIS — J438 Other emphysema: Secondary | ICD-10-CM | POA: Diagnosis not present

## 2013-12-31 DIAGNOSIS — J449 Chronic obstructive pulmonary disease, unspecified: Secondary | ICD-10-CM | POA: Diagnosis not present

## 2013-12-31 NOTE — Telephone Encounter (Signed)
LMTCB

## 2014-01-03 NOTE — Telephone Encounter (Signed)
Called [facility], received the answering service which could not help me. WCB. Rhea Bing, CMA

## 2014-01-03 NOTE — Telephone Encounter (Signed)
Bridget Walker is returning triage's call.  Bridget Walker

## 2014-01-03 NOTE — Telephone Encounter (Signed)
I called and spoke with daughter. She reports the test was done Friday night. So we should be receiving the results soon then. She needed nothing further

## 2014-01-06 ENCOUNTER — Ambulatory Visit (INDEPENDENT_AMBULATORY_CARE_PROVIDER_SITE_OTHER): Payer: Medicare Other | Admitting: Internal Medicine

## 2014-01-06 ENCOUNTER — Encounter: Payer: Self-pay | Admitting: Internal Medicine

## 2014-01-06 VITALS — BP 112/68 | HR 97 | Ht 60.0 in | Wt 128.0 lb

## 2014-01-06 DIAGNOSIS — J439 Emphysema, unspecified: Secondary | ICD-10-CM

## 2014-01-06 DIAGNOSIS — J449 Chronic obstructive pulmonary disease, unspecified: Secondary | ICD-10-CM | POA: Diagnosis not present

## 2014-01-06 DIAGNOSIS — J438 Other emphysema: Secondary | ICD-10-CM

## 2014-01-06 NOTE — Patient Instructions (Addendum)
Order- DME new O2 sleep 2L  Dx COPD, hypoxemia             ([facility] did the [employer])

## 2014-01-06 NOTE — Progress Notes (Signed)
06/08/12- 20 yoF former smoker  referred by Dr Benay Spice. Pt states having Increase SOB upon activity Productive cough in am . PCP Dr Loura Pardon    Son here She is being monitored for possible pancreatic tumor and has history of a biliary stent. For about 3 months she has been aware of dyspnea on exertion while walking, relieved by sitting. She had a "mild outpatient pneumonia" treated 2 weeks ago. She had been exercising at the [facility]. She was noting more shortness of breath with exertion after bad cold and some shingles. Weight had been going down. She has been drinking diet supplements to gain weight. She denies cough, wheeze, chest pain. She does hear a rattle but coughs out nothing. Legs feel weak. She denies fever, blood or swollen glands. She denies past history of asthma or pneumonia and she denies heart disease, reflux, choking with meals. She is widowed, living in a retirement center. 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 Father died with pneumonia, mother died with MI. CT chest was concerning for density, more than a "mild pneumonia". CT chest 05/07/12- reviewed with her IMPRESSION:  No evidence of significant pulmonary embolus. The extensive  consolidation of the right lower lung and right middle lung with  patchy areas of nodular infiltration in the left lower lung.  Changes may represent pneumonia. No definite evidence of a central  obstructing lesion although the distal right main stem bronchus is  somewhat narrowed and a cold mass may be obscured by the  consolidation. Recommend follow up after resolution of acute  symptoms. Large esophageal hiatal hernia with mild esophageal  dilatation. Esophagitis versus dysmotility. Biliary wall stent  and biliary gas demonstrated in the upper abdomen.  Original Report Authenticated By: Neale Burly, M.D.   CXR 06/03/12- IMPRESSION:  1. Multi focal  right lung airspace disease has not significantly  changed since 05/07/2012.  2. Interval increased patchy opacity at the left lung base.  Original Report Authenticated By: Randall An, M.D.   07/07/12- 56 yoF former smoker followed for pneumonia complicated by large hiatal hernia, pancreatic cancer. Dr Sherrill/ Onc> Hospice. Daughter and son here today. She is now followed by hospice because of pancreatic cancer with biliary stent. Notices dyspnea standing, and cough as she gets out of bed and starts moving. Mostly clear phlegm, occasional scant green. Wakes herself coughing. Avelox did not help. We reviewed the CT scan images again, nothing large hiatal hernia full of food and parenchymal consolidation consistent with pneumonia and especially consistent with aspiration pneumonia. Discussed possibility of organizing pneumonia.  07/27/12-  55 yoF former smoker followed for pneumonia complicated by large hiatal hernia, pancreatic cancer.  Dtr here. Sputum CX 06/10/12- Nl flora, neg for AFB. Dr Ardis Hughs has indicated steroid trial, aimed at organizing pneumonia, is ok from pancreas standpoint.  Very short of breath after shower. Home oxygen helps, provided by Hospice through? [facility]. No pain, fever, sweat. Scant clear mucus. Flutter device helps shortness of breath after use, but does not generate productive cough. She denies any choking or strangling with meals or during sleep. CXR I 07/27/12- images reviewed with them. IMPRESSION:  1. Worsening asymmetric airspace disease involving right lung  greater than left, suspicious for pneumonia.  2. Stable mild cardiomegaly.  3. Hiatal hernia again noted.  Original Report Authenticated By: Marlaine Hind, M.D.   08/13/12- 22 yoF former smoker followed for  pneumonia complicated by large hiatal hernia, pancreatic cancer.  Dtr here.  Pt reports legs no longer hurt, no more coughing in the morning, wearing O2 most of the time--not today. Has continued  maintenance prednisone 20 mg daily and recognizes breathing is better but it gives her some insomnia. Has elevated head of bed. Using oxygen at 2 L only as needed now. Not coughing at all. Wants flu vax CXR 08/13/12- IMPRESSION:  Improved right lower lobe and left lung base infiltrates.  Underlying COPD. No new abnormalities.   10/02/12- 72 yoF former smoker followed for pneumonia complicated by large hiatal hernia, pancreatic cancer.  Dtr here. Follow-Up  - has been feeling very anxious lately. Hospice nurse thinks that she needs something for anxiety. Prednisone seemed to reduce the amount of parenchymal density, consistent with impression that it was organizing pneumonia. She is followed with a hospice nurse. Reducing prednisone to 5 mg daily left her too short of breath, so  she went back to 10 mg daily. Less morning cough. Recent night sweats. Low back pain sometimes radiates to abdomen. CXR 10/02/12- we reviewed the images together IMPRESSION:  Improved airspace disease in the right mid and lower lung zones.  Persistent opacity is noted  Stable spiculated opacity at the left lung base. Underlying  malignancy cannot be excluded.  Original Report Authenticated By: Jamas Lav, M.D.   03/22/13- 50 yoF former smoker followed for pneumonia complicated by large hiatal hernia.  Dtr here. FOLLOWS FOR: breathing doing good as long as wearing O2; daughter states that pt is still breathy; hospice is pulling out as pt does not have pancreatic cancer; will need order for O2 through DME company since hospice is no longer going to help with this.  Has been using prednisone 10 mg alternating with 5 mg every other day. Makes her anxious. Mentions pains and right rib cage that seem to come and go.  05/17/13- 65 yoF former smoker followed for Hx pneumonia, COPD/E,  complicated by large hiatal hernia.  Dtr here. FOLLOWS FOR: continues to use O2 QHS. Breathing is doing okay O2 2-4L/ [facility], just for  sleep and as needed, but she doesn't think she needs much. Moving to assisted living. Per the right side of her back exercising, helped by physical therapy Prednisone now reduced to 5 mg daily. CXR 03/23/13-Stable spiculated opacity at the left lung base. Large hiatal hernia. IMPRESSION:  No acute abnormality is noted.  Original Report Authenticated By: Inez Catalina, M.D  08/19/13- 33 yoF former smoker followed for Hx pneumonia, COPD/E, lung nodule,  complicated by large hiatal hernia.  Dtr here FOLLOWS FOR: unsure if needs to go back to O2; uses her flutter device and feels that  helps some; had called about SOB and had prednisone increased. Cut off oxygen  Then tapered prednisone. Started getting more short of breath so she increased the prednisone back to 10 mg daily for the past month. Occasional dyspnea on exertion in assisted living but now feels comfortable. //needs f/u CXR for lung nodule//  11/25/13 Follow up COPD  Has to re- qualify for O2 -- did not qualify today here in the office.  No desats with walking.  Says overall breathing is the same . No flare in cough, wheezing or dyspnea. Gets winded easily.  On prednisone 5mg  alterating with 10mg  daily   01/06/14- 88 yoF former smoker followed for Hx pneumonia, COPD/E, lung nodule,  complicated by large hiatal hernia.  Dtr here FOLLOWS FOR:  Still  having sob with exertion-- discuss ONO results Diagnosed CHF in October. Hospitalized in October for easy dyspnea on exertion. Better now. Little cough. ONOX 01/01/14- qualified for sleep oxygen  ROS-see HPI Constitutional:   No-  weight loss, night sweats, fevers, chills, fatigue, lassitude. HEENT:   No-  headaches, difficulty swallowing, tooth/dental problems, sore throat,       No-  sneezing, itching, ear ache, nasal congestion, post nasal drip,  CV:  No-   chest pain, orthopnea, PND, swelling in lower extremities, anasarca,dizziness, palpitations Resp: +shortness of breath with exertion or  at rest.              No- productive cough,  No non-productive cough,  No- coughing up of blood.              No-   change in color of mucus.  No- wheezing.   Skin: No-   rash or lesions. GI:  No-   heartburn, indigestion, abdominal pain, nausea, vomiting,  GU:  MS:  No-   joint pain or swelling.  +back pain Neuro-     nothing unusual Psych:  No- change in mood or affect.  depression or +anxiety.  No memory loss.  OBJ- Physical Exam General- Alert, Oriented, Affect-appropriate, Distress- none acute, thin elderly woman Skin- rash-none, lesions- none, excoriation- none Lymphadenopathy- none Head- atraumatic            Eyes- Gross vision intact, PERRLA, conjunctivae and secretions clear            Ears- Hearing, canals-normal            Nose- Clear, no-Septal dev, mucus, polyps, erosion, perforation             Throat- Mallampati II , mucosa clear , drainage- none, tonsils- atrophic. Own teeth Neck- flexible , trachea midline, no stridor , thyroid nl, carotid no bruit Chest - symmetrical excursion , unlabored           Heart/CV- RRR/few extra beats , no murmur , no gallop  , no rub, nl s1 s2                           - JVD- none , edema- none, stasis changes- none, varices- none           Lung- .shallow, +few crackles, unlabored, rub- none, no cough or wheeze           Chest wall-  Abd-  Br/ Gen/ Rectal- Not done, not indicated Extrem- cyanosis- none, clubbing, none, atrophy- none, strength- nl, using a walker Neuro- grossly intact to observation

## 2014-01-17 DIAGNOSIS — H04129 Dry eye syndrome of unspecified lacrimal gland: Secondary | ICD-10-CM | POA: Diagnosis not present

## 2014-01-17 DIAGNOSIS — Z961 Presence of intraocular lens: Secondary | ICD-10-CM | POA: Diagnosis not present

## 2014-01-17 DIAGNOSIS — H43819 Vitreous degeneration, unspecified eye: Secondary | ICD-10-CM | POA: Diagnosis not present

## 2014-01-17 DIAGNOSIS — H16109 Unspecified superficial keratitis, unspecified eye: Secondary | ICD-10-CM | POA: Diagnosis not present

## 2014-01-18 ENCOUNTER — Encounter: Payer: Self-pay | Admitting: Adult Health

## 2014-01-18 DIAGNOSIS — G4734 Idiopathic sleep related nonobstructive alveolar hypoventilation: Secondary | ICD-10-CM | POA: Insufficient documentation

## 2014-01-19 ENCOUNTER — Telehealth: Payer: Self-pay | Admitting: Adult Health

## 2014-01-19 NOTE — Telephone Encounter (Signed)
1.23.15 ONO per TP: ++ desats, continue O2 2lpm at bedtime  Called spoke with pt's daughter who reported that they already received results at 1.29.15 ov w/ CDY and order was sent Nothing needed; will sign off Will send ONO for scan.

## 2014-01-29 ENCOUNTER — Other Ambulatory Visit: Payer: Self-pay | Admitting: Family Medicine

## 2014-01-30 ENCOUNTER — Encounter: Payer: Self-pay | Admitting: Internal Medicine

## 2014-01-30 NOTE — Assessment & Plan Note (Signed)
Controlled. Complicated  By CHF which is now improved. Overnight oximetry documented need for oxygen during sleep Plan-start home oxygen 2 L for sleep

## 2014-01-31 ENCOUNTER — Telehealth: Payer: Self-pay | Admitting: *Deleted

## 2014-01-31 NOTE — Telephone Encounter (Signed)
Electronic refill request, please advise  

## 2014-01-31 NOTE — Telephone Encounter (Signed)
Bridget Walker said that pt's mail order pharmacy ([facility]) has been sending a high does of Creon(?10,000iu), but pt is taking creon 3000units TID. Bridget Walker would like [place] to send in new Rx to [facility] for the Creon 3000iu with Sig of taking it TID, please advise

## 2014-01-31 NOTE — Telephone Encounter (Signed)
done

## 2014-01-31 NOTE — Telephone Encounter (Signed)
Please refill times 3 

## 2014-01-31 NOTE — Telephone Encounter (Signed)
I hand wrote the px since I did not see that strength avail in epic In IN box  Please change in med list -thanks

## 2014-02-02 NOTE — Telephone Encounter (Signed)
Rx faxed to [facility].

## 2014-02-06 ENCOUNTER — Other Ambulatory Visit: Payer: Self-pay | Admitting: Internal Medicine

## 2014-02-07 ENCOUNTER — Other Ambulatory Visit: Payer: Self-pay | Admitting: Internal Medicine

## 2014-02-12 ENCOUNTER — Other Ambulatory Visit: Payer: Self-pay | Admitting: Family Medicine

## 2014-02-14 ENCOUNTER — Telehealth: Payer: Self-pay

## 2014-02-14 NOTE — Telephone Encounter (Signed)
Spoke w/ Leafy Ro, RN.  She states that she will continue to monitor pt's sx and call if wt continues to climb. Pt has appt to see Dr. Glori Bickers next week and will call us w/ further questions or concerns.

## 2014-02-14 NOTE — Telephone Encounter (Signed)
Nurse with [facility] called and states pt weight is 131, has been for 3-4 days, no signs of distress, no SOB. Please call,.

## 2014-02-14 NOTE — Telephone Encounter (Signed)
Left message for [name] to call back.

## 2014-02-14 NOTE — Telephone Encounter (Signed)
Leafy Ro, RN left message on voicemail that Bridget Walker's wt is usually consistently in the upper 120s (128-130), but has remained at 131 since Thursday. She states that pt is doing well, no additional sob (reports she is sob at her baseline) and no edema. She wanted to make Dr. Rockey Situ aware in case he would like to make any changes.  Please advise.  Thank you.

## 2014-02-17 ENCOUNTER — Ambulatory Visit: Payer: Medicare Other | Admitting: Internal Medicine

## 2014-02-22 ENCOUNTER — Inpatient Hospital Stay: Payer: Self-pay | Admitting: Internal Medicine

## 2014-02-22 DIAGNOSIS — K861 Other chronic pancreatitis: Secondary | ICD-10-CM | POA: Diagnosis present

## 2014-02-22 DIAGNOSIS — Z880 Allergy status to penicillin: Secondary | ICD-10-CM | POA: Diagnosis not present

## 2014-02-22 DIAGNOSIS — I2489 Other forms of acute ischemic heart disease: Secondary | ICD-10-CM | POA: Diagnosis not present

## 2014-02-22 DIAGNOSIS — Z66 Do not resuscitate: Secondary | ICD-10-CM | POA: Diagnosis present

## 2014-02-22 DIAGNOSIS — I509 Heart failure, unspecified: Secondary | ICD-10-CM | POA: Diagnosis not present

## 2014-02-22 DIAGNOSIS — F411 Generalized anxiety disorder: Secondary | ICD-10-CM | POA: Diagnosis present

## 2014-02-22 DIAGNOSIS — R1110 Vomiting, unspecified: Secondary | ICD-10-CM | POA: Diagnosis not present

## 2014-02-22 DIAGNOSIS — K8689 Other specified diseases of pancreas: Secondary | ICD-10-CM | POA: Diagnosis not present

## 2014-02-22 DIAGNOSIS — R0609 Other forms of dyspnea: Secondary | ICD-10-CM | POA: Diagnosis not present

## 2014-02-22 DIAGNOSIS — K449 Diaphragmatic hernia without obstruction or gangrene: Secondary | ICD-10-CM | POA: Diagnosis present

## 2014-02-22 DIAGNOSIS — I428 Other cardiomyopathies: Secondary | ICD-10-CM | POA: Diagnosis present

## 2014-02-22 DIAGNOSIS — I10 Essential (primary) hypertension: Secondary | ICD-10-CM | POA: Diagnosis present

## 2014-02-22 DIAGNOSIS — R0989 Other specified symptoms and signs involving the circulatory and respiratory systems: Secondary | ICD-10-CM | POA: Diagnosis not present

## 2014-02-22 DIAGNOSIS — J8410 Pulmonary fibrosis, unspecified: Secondary | ICD-10-CM | POA: Diagnosis present

## 2014-02-22 DIAGNOSIS — I5022 Chronic systolic (congestive) heart failure: Secondary | ICD-10-CM | POA: Diagnosis not present

## 2014-02-22 DIAGNOSIS — R748 Abnormal levels of other serum enzymes: Secondary | ICD-10-CM | POA: Diagnosis not present

## 2014-02-22 DIAGNOSIS — I5020 Unspecified systolic (congestive) heart failure: Secondary | ICD-10-CM | POA: Diagnosis present

## 2014-02-22 DIAGNOSIS — I359 Nonrheumatic aortic valve disorder, unspecified: Secondary | ICD-10-CM | POA: Diagnosis not present

## 2014-02-22 DIAGNOSIS — Z885 Allergy status to narcotic agent status: Secondary | ICD-10-CM | POA: Diagnosis not present

## 2014-02-22 DIAGNOSIS — J438 Other emphysema: Secondary | ICD-10-CM | POA: Diagnosis present

## 2014-02-22 DIAGNOSIS — Z79899 Other long term (current) drug therapy: Secondary | ICD-10-CM | POA: Diagnosis not present

## 2014-02-22 DIAGNOSIS — E7800 Pure hypercholesterolemia, unspecified: Secondary | ICD-10-CM | POA: Diagnosis present

## 2014-02-22 DIAGNOSIS — I248 Other forms of acute ischemic heart disease: Secondary | ICD-10-CM | POA: Diagnosis not present

## 2014-02-22 DIAGNOSIS — I214 Non-ST elevation (NSTEMI) myocardial infarction: Secondary | ICD-10-CM | POA: Diagnosis not present

## 2014-02-22 DIAGNOSIS — Z7982 Long term (current) use of aspirin: Secondary | ICD-10-CM | POA: Diagnosis not present

## 2014-02-22 DIAGNOSIS — J449 Chronic obstructive pulmonary disease, unspecified: Secondary | ICD-10-CM | POA: Diagnosis not present

## 2014-02-22 DIAGNOSIS — Z87891 Personal history of nicotine dependence: Secondary | ICD-10-CM | POA: Diagnosis not present

## 2014-02-22 DIAGNOSIS — J984 Other disorders of lung: Secondary | ICD-10-CM | POA: Diagnosis present

## 2014-02-22 DIAGNOSIS — E860 Dehydration: Secondary | ICD-10-CM | POA: Diagnosis present

## 2014-02-22 DIAGNOSIS — K219 Gastro-esophageal reflux disease without esophagitis: Secondary | ICD-10-CM | POA: Diagnosis present

## 2014-02-22 DIAGNOSIS — IMO0002 Reserved for concepts with insufficient information to code with codable children: Secondary | ICD-10-CM | POA: Diagnosis not present

## 2014-02-22 LAB — URINALYSIS, COMPLETE
Bilirubin,UR: NEGATIVE
Glucose,UR: NEGATIVE mg/dL (ref 0–75)
Hyaline Cast: 3
Ketone: NEGATIVE
LEUKOCYTE ESTERASE: NEGATIVE
NITRITE: NEGATIVE
PH: 5 (ref 4.5–8.0)
Protein: NEGATIVE
RBC, UR: NONE SEEN /HPF (ref 0–5)
SPECIFIC GRAVITY: 1.01 (ref 1.003–1.030)
SQUAMOUS EPITHELIAL: NONE SEEN
WBC UR: 2 /HPF (ref 0–5)

## 2014-02-22 LAB — BASIC METABOLIC PANEL
Anion Gap: 6 — ABNORMAL LOW (ref 7–16)
BUN: 32 mg/dL — AB (ref 7–18)
CALCIUM: 8.7 mg/dL (ref 8.5–10.1)
CHLORIDE: 103 mmol/L (ref 98–107)
CO2: 24 mmol/L (ref 21–32)
Creatinine: 1.16 mg/dL (ref 0.60–1.30)
GFR CALC AF AMER: 49 — AB
GFR CALC NON AF AMER: 43 — AB
Glucose: 172 mg/dL — ABNORMAL HIGH (ref 65–99)
OSMOLALITY: 277 (ref 275–301)
POTASSIUM: 4.4 mmol/L (ref 3.5–5.1)
Sodium: 133 mmol/L — ABNORMAL LOW (ref 136–145)

## 2014-02-22 LAB — CBC WITH DIFFERENTIAL/PLATELET
Basophil #: 0 10*3/uL (ref 0.0–0.1)
Basophil %: 0.2 %
Eosinophil #: 0 10*3/uL (ref 0.0–0.7)
Eosinophil %: 0.2 %
HCT: 36 % (ref 35.0–47.0)
HGB: 11.5 g/dL — ABNORMAL LOW (ref 12.0–16.0)
LYMPHS ABS: 0.2 10*3/uL — AB (ref 1.0–3.6)
Lymphocyte %: 3 %
MCH: 30.2 pg (ref 26.0–34.0)
MCHC: 31.9 g/dL — ABNORMAL LOW (ref 32.0–36.0)
MCV: 95 fL (ref 80–100)
MONO ABS: 0.1 x10 3/mm — AB (ref 0.2–0.9)
MONOS PCT: 2 %
NEUTROS ABS: 6.1 10*3/uL (ref 1.4–6.5)
Neutrophil %: 94.6 %
Platelet: 219 10*3/uL (ref 150–440)
RBC: 3.81 10*6/uL (ref 3.80–5.20)
RDW: 14.9 % — ABNORMAL HIGH (ref 11.5–14.5)
WBC: 6.5 10*3/uL (ref 3.6–11.0)

## 2014-02-22 LAB — CK-MB
CK-MB: 1.3 ng/mL (ref 0.5–3.6)
CK-MB: 1.9 ng/mL (ref 0.5–3.6)
CK-MB: 2.2 ng/mL (ref 0.5–3.6)

## 2014-02-22 LAB — TROPONIN I
TROPONIN-I: 0.06 ng/mL — AB
Troponin-I: 0.38 ng/mL — ABNORMAL HIGH
Troponin-I: 0.75 ng/mL — ABNORMAL HIGH

## 2014-02-23 ENCOUNTER — Telehealth: Payer: Self-pay | Admitting: *Deleted

## 2014-02-23 DIAGNOSIS — I359 Nonrheumatic aortic valve disorder, unspecified: Secondary | ICD-10-CM | POA: Diagnosis not present

## 2014-02-23 DIAGNOSIS — K8689 Other specified diseases of pancreas: Secondary | ICD-10-CM | POA: Diagnosis not present

## 2014-02-23 DIAGNOSIS — J449 Chronic obstructive pulmonary disease, unspecified: Secondary | ICD-10-CM | POA: Diagnosis not present

## 2014-02-23 DIAGNOSIS — I248 Other forms of acute ischemic heart disease: Secondary | ICD-10-CM | POA: Diagnosis not present

## 2014-02-23 DIAGNOSIS — I5022 Chronic systolic (congestive) heart failure: Secondary | ICD-10-CM | POA: Diagnosis not present

## 2014-02-23 DIAGNOSIS — I509 Heart failure, unspecified: Secondary | ICD-10-CM | POA: Diagnosis not present

## 2014-02-23 DIAGNOSIS — I214 Non-ST elevation (NSTEMI) myocardial infarction: Secondary | ICD-10-CM | POA: Diagnosis not present

## 2014-02-23 LAB — CBC WITH DIFFERENTIAL/PLATELET
BASOS ABS: 0 10*3/uL (ref 0.0–0.1)
Basophil %: 0.2 %
EOS ABS: 0.1 10*3/uL (ref 0.0–0.7)
Eosinophil %: 0.6 %
HCT: 34.4 % — AB (ref 35.0–47.0)
HGB: 11.7 g/dL — ABNORMAL LOW (ref 12.0–16.0)
Lymphocyte #: 0.8 10*3/uL — ABNORMAL LOW (ref 1.0–3.6)
Lymphocyte %: 9.4 %
MCH: 32.1 pg (ref 26.0–34.0)
MCHC: 33.9 g/dL (ref 32.0–36.0)
MCV: 95 fL (ref 80–100)
Monocyte #: 0.4 x10 3/mm (ref 0.2–0.9)
Monocyte %: 5.1 %
Neutrophil #: 7.2 10*3/uL — ABNORMAL HIGH (ref 1.4–6.5)
Neutrophil %: 84.7 %
Platelet: 208 10*3/uL (ref 150–440)
RBC: 3.64 10*6/uL — ABNORMAL LOW (ref 3.80–5.20)
RDW: 15 % — ABNORMAL HIGH (ref 11.5–14.5)
WBC: 8.6 10*3/uL (ref 3.6–11.0)

## 2014-02-23 LAB — BASIC METABOLIC PANEL
Anion Gap: 3 — ABNORMAL LOW (ref 7–16)
BUN: 26 mg/dL — ABNORMAL HIGH (ref 7–18)
CHLORIDE: 102 mmol/L (ref 98–107)
CREATININE: 1.19 mg/dL (ref 0.60–1.30)
Calcium, Total: 8.6 mg/dL (ref 8.5–10.1)
Co2: 29 mmol/L (ref 21–32)
EGFR (Non-African Amer.): 41 — ABNORMAL LOW
GFR CALC AF AMER: 48 — AB
GLUCOSE: 97 mg/dL (ref 65–99)
Osmolality: 273 (ref 275–301)
Potassium: 4.1 mmol/L (ref 3.5–5.1)
Sodium: 134 mmol/L — ABNORMAL LOW (ref 136–145)

## 2014-02-23 NOTE — Telephone Encounter (Signed)
She needs to leave her number. The phone # I received from nurse on 2A does not work

## 2014-02-23 NOTE — Telephone Encounter (Signed)
Patient's daughter called and wants to talk to Dr. Rockey Situ regarding when mother will be discharged and her plan of care. Please call after 5. She is in the hospital at [facility]. Thanks

## 2014-02-24 DIAGNOSIS — I214 Non-ST elevation (NSTEMI) myocardial infarction: Secondary | ICD-10-CM | POA: Diagnosis not present

## 2014-02-24 DIAGNOSIS — I509 Heart failure, unspecified: Secondary | ICD-10-CM | POA: Diagnosis not present

## 2014-02-24 DIAGNOSIS — K8689 Other specified diseases of pancreas: Secondary | ICD-10-CM | POA: Diagnosis not present

## 2014-02-24 DIAGNOSIS — J449 Chronic obstructive pulmonary disease, unspecified: Secondary | ICD-10-CM | POA: Diagnosis not present

## 2014-02-24 NOTE — Telephone Encounter (Signed)
Spoke w/ [name].  She would like to speak w/ Dr. Rockey Situ or Nicole Kindred regarding the plan for her mother. She states that [phone] is the best # to reach her at.

## 2014-02-24 NOTE — Telephone Encounter (Signed)
Hospitalist Kalisetti managing disposition including discharge. Would probably receive a more comprehensive plan of care to call floor and contact hospitalist while patient in the hospital.  From a cardiac standpoint, BP improved. Elevated cardiac markers suspected to be due to demand ischemia from low blood pressure. Recent cath with minimal CAD. Lasix held due to low BP. To be started on low doses of important heart meds to improve strength of her heart for heart failure. These will need to be started once BP improved. She should follow-up with [place] in 1-2 weeks and drink conservative amounts of fluid (<2L/day) given evidence of dehydration this admission.

## 2014-02-24 NOTE — Telephone Encounter (Signed)
Pt daughter called back and wants to speak with Dr. Rockey Situ regarding her mother who is in [facility]. Contact # [phone] Madalyn Rob)

## 2014-02-24 NOTE — Telephone Encounter (Signed)
Sorry that I missed talking to the daughter yesterday Perhaps the daughter could call the hospitalist who is covering I did not round on her today, Thursday Perhaps any further cardiac questions could be directed to Dr. Fletcher Anon who saw her today and will see her tomorrow, Friday

## 2014-02-25 ENCOUNTER — Telehealth: Payer: Self-pay

## 2014-02-25 DIAGNOSIS — I214 Non-ST elevation (NSTEMI) myocardial infarction: Secondary | ICD-10-CM | POA: Diagnosis not present

## 2014-02-25 DIAGNOSIS — J449 Chronic obstructive pulmonary disease, unspecified: Secondary | ICD-10-CM | POA: Diagnosis not present

## 2014-02-25 DIAGNOSIS — K8689 Other specified diseases of pancreas: Secondary | ICD-10-CM | POA: Diagnosis not present

## 2014-02-25 DIAGNOSIS — I509 Heart failure, unspecified: Secondary | ICD-10-CM | POA: Diagnosis not present

## 2014-02-25 NOTE — Telephone Encounter (Signed)
Left detailed message for [name].  Asked her to call back if she would like to speak w/ Dr. Fletcher Anon, otherwise advised her to call the hospital.

## 2014-02-25 NOTE — Telephone Encounter (Signed)
Attempted to contact pt regarding discharge from [facility] on 02/24/14. Left message for [name] to call back.

## 2014-02-28 ENCOUNTER — Encounter: Payer: Self-pay | Admitting: Cardiovascular Disease

## 2014-02-28 LAB — CULTURE, BLOOD (SINGLE)

## 2014-03-01 ENCOUNTER — Telehealth: Payer: Self-pay

## 2014-03-01 ENCOUNTER — Telehealth: Payer: Self-pay | Admitting: Family Medicine

## 2014-03-01 ENCOUNTER — Encounter: Payer: Self-pay | Admitting: Cardiovascular Disease

## 2014-03-01 NOTE — Telephone Encounter (Signed)
Spoke w/ Bridget Walker.  Advised her that Dr. Rockey Situ has not written rx for Levaquin.   She states that pt does not have any type of infection and this med was not mentioned in her d/c instructions.  She will check the prescriber's name on the bottle and call the hospital for clarification. Asked her to call w/ further questions or concerns.

## 2014-03-01 NOTE — Telephone Encounter (Signed)
Pt's daughter, Reynold Bowen left vm.  She states that pt has an appt for a physical with Dr. Glori Bickers tomorrow but that she was in the hospital last week and they were told she needed to have a hospital follow up before her physical.  She knows it will be awhile before she can get another physical appt and wants to know if she can do the physical and hospital follow up during the same appt tomorrow.  Cb [phone].

## 2014-03-01 NOTE — Telephone Encounter (Signed)
I cannot do both at the same time - but the hosp f/u will be most important and we can review as much as time allows - then will re schedule the health mt exam at her convenience

## 2014-03-01 NOTE — Telephone Encounter (Signed)
Daughter notified we are going to do the hospital f/u 1st and we can work pt in schedule to do the CPE at her convenience and pt doesn's have to wait until Aug for the next CPE opening

## 2014-03-01 NOTE — Telephone Encounter (Signed)
Pt daughter called and states pt was prescribed Levaquin, asks if Dr. Rockey Situ sent this. She did pick up rx and  States the nurse at [facility] is waiting to give her this rx. Would like a call back this morning.

## 2014-03-02 ENCOUNTER — Ambulatory Visit (INDEPENDENT_AMBULATORY_CARE_PROVIDER_SITE_OTHER): Payer: Medicare Other | Admitting: Family Medicine

## 2014-03-02 ENCOUNTER — Encounter: Payer: Medicare Other | Admitting: Family Medicine

## 2014-03-02 ENCOUNTER — Encounter: Payer: Self-pay | Admitting: Cardiovascular Disease

## 2014-03-02 ENCOUNTER — Encounter: Payer: Self-pay | Admitting: Family Medicine

## 2014-03-02 VITALS — BP 102/62 | HR 93 | Temp 97.9°F | Ht 60.0 in | Wt 135.5 lb

## 2014-03-02 DIAGNOSIS — R7881 Bacteremia: Secondary | ICD-10-CM | POA: Diagnosis not present

## 2014-03-02 DIAGNOSIS — J439 Emphysema, unspecified: Secondary | ICD-10-CM

## 2014-03-02 DIAGNOSIS — I5022 Chronic systolic (congestive) heart failure: Secondary | ICD-10-CM

## 2014-03-02 LAB — POCT URINALYSIS DIPSTICK
Bilirubin, UA: NEGATIVE
GLUCOSE UA: NEGATIVE
Leukocytes, UA: NEGATIVE
NITRITE UA: NEGATIVE
PH UA: 6
Spec Grav, UA: 1.025
Urobilinogen, UA: 0.2

## 2014-03-02 LAB — CULTURE, BLOOD (SINGLE)

## 2014-03-02 NOTE — Progress Notes (Signed)
Pre visit review using our clinic review tool, if applicable. No additional management support is needed unless otherwise documented below in the visit note. 

## 2014-03-02 NOTE — Progress Notes (Signed)
Subjective:    Patient ID: Bridget Walker, female    DOB: 30-Sep-1925, 80 y.o.   MRN: [id number]  HPI Pt here for post hosp f/u   (3/17-3/20) Was admitted to [facility] on 3/17 for shaking chills  Found to have a bump in troponin= seen by cardiol suspect non-STEMI  Thought to be due to demand ischemia - ? Hypoxia 2D echo - EF was 25-30% close to her baseline   Lung fibrosis /scarring seen on cxr  ua clear (no culture) No urinary symptoms   Then blood cx returned pos for gm neg rods  (faxed to [facility]) She was put  on levaquin - is feeling much better today (was very sluggish and sick feeling before that) Started it yesterday  Several of her medicines were dropped due to ? hypotension  Has f/u with cardiology on 4/2   Wt is up 7lb today (her lasix was stopped) She does not feel swollen particularly She gets daily weights at [facility] - they let Dr Rockey Situ know that her weight is up   Patient Active Problem List   Diagnosis Date Noted  . Blood bacterial culture positive 03/02/2014  . Nocturnal hypoxemia 01/18/2014  . Chronic systolic CHF (congestive heart failure) 10/20/2013  . Compression fracture of spine 06/10/2013  . Back pain of thoracolumbar region 06/09/2013  . Mobility impaired 04/16/2013  . Frequent PVCs 03/28/2013  . COPD with emphysema 03/28/2013  . Lung nodule, L lower lobe 10/15/2012  . Lobar pneumonia due to unspecified organism 06/11/2012  . Abnormal CT scan, gastrointestinal tract 03/24/2012  . Nonspecific (abnormal) findings on radiological and other examination of biliary tract 03/24/2012  . Weight loss 03/18/2012  . Fatigue 03/18/2012  . Jaundice 03/18/2012  . Loss of appetite 03/18/2012  . Anemia 11/29/2011  . TINNITUS 08/09/2010  . OVERACTIVE BLADDER 03/14/2010  . GERD 11/06/2009  . UNSPECIFIED VITAMIN D DEFICIENCY 10/31/2008  . DIVERTICULOSIS OF COLON 12/25/2007  . RESTLESS LEG SYNDROME 10/26/2007  . COLONIC POLYPS, HX OF 10/26/2007  .  HYPERCHOLESTEROLEMIA 09/23/2007  . OSTEOARTHRITIS 09/23/2007  . OSTEOPOROSIS 09/23/2007   Past Medical History  Diagnosis Date  . Osteoporosis   . Hyperlipidemia   . RLS (restless legs syndrome)   . Vitamin D deficiency   . GERD (gastroesophageal reflux disease)   . Cerumen impaction     recurrent  . Unexplained weight loss   . Cough   . Constipation   . Diarrhea   . Shingles   . Overactive bladder   . Cancer     pancreatic  . Arthritis     OA, in hands   Past Surgical History  Procedure Laterality Date  . Cataract extraction      patient unsure of date procedure was done.  . Cataract extraction  11/08    2nd cataract  . Ercp  03/24/2012    Procedure: ENDOSCOPIC RETROGRADE CHOLANGIOPANCREATOGRAPHY (ERCP);  Surgeon: Milus Banister, MD;  Location: [facility];  Service: Endoscopy;  Laterality: N/A;  . Eus  04/02/2012    Procedure: ESOPHAGEAL ENDOSCOPIC ULTRASOUND (EUS) RADIAL;  Surgeon: Milus Banister, MD;  Location: [facility];  Service: Endoscopy;  Laterality: N/A;  EUS with FNA  . Fine needle aspiration  04/02/2012    Procedure: FINE NEEDLE ASPIRATION (FNA) LINEAR;  Surgeon: Milus Banister, MD;  Location: [facility];  Service: Endoscopy;  Laterality: N/A;  . Endoscopic retrograde cholangiopancreatography (ercp) with propofol N/A 01/28/2013    Procedure: ENDOSCOPIC RETROGRADE CHOLANGIOPANCREATOGRAPHY (ERCP)  WITH PROPOFOL;  Surgeon: Milus Banister, MD;  Location: [facility];  Service: Endoscopy;  Laterality: N/A;  . Cardiac catheterization  10/14    [facility];no stent   History  Substance Use Topics  . Smoking status: Former Smoker -- 0.25 packs/day for 20 years    Types: Cigarettes    Quit date: 03/23/1992  . Smokeless tobacco: Never Used     Comment: PT SMOKED 2 CIGARETTES A DAY   . Alcohol Use: No   Family History  Problem Relation Age of Onset  . Diabetes Sister   . Hypertension Sister   . Heart disease Brother   . Fibromyalgia Daughter   . Alcohol abuse  Son   . Heart disease Brother   . Diabetes Brother   . Heart disease Mother   . Pneumonia Father   . Cancer Other     niece had breast and bone cancer  . Heart failure Father    Allergies  Allergen Reactions  . Lactose Intolerance (Gi)     Pt can drink skim milk  . Hydrocodone Rash    Patient unsure of reaction, but it may have been diarrhea.  . Penicillins Rash    On scalp   Current Outpatient Prescriptions on File Prior to Visit  Medication Sig Dispense Refill  . acetaminophen (TYLENOL) 500 MG tablet Take by mouth. 1-2 tab every 6 hrs prn      . ALPRAZolam (XANAX) 0.5 MG tablet Take 0.5 mg by mouth 3 (three) times daily as needed for anxiety.      Marland Kitchen aspirin EC 81 MG tablet Take 81 mg by mouth daily.      . Calcium Carbonate-Vitamin D (CALTRATE 600+D PO) Take 2 tablets by mouth daily.      Marland Kitchen esomeprazole (NEXIUM) 40 MG capsule TAKE 1 CAPSULE (40 MG TOTAL) BY MOUTH DAILY BEFORE BREAKFAST.  90 capsule  3  . guaiFENesin (MUCINEX) 600 MG 12 hr tablet Take 600 mg by mouth as needed. For cough and increased mucus      . megestrol (MEGACE) 40 MG/ML suspension Take 10 mLs (400 mg total) by mouth daily.  240 mL  0  . meloxicam (MOBIC) 7.5 MG tablet TAKE 1 TABLET DAILY  90 tablet  2  . Pancrelipase, Lip-Prot-Amyl, 3000-10000 UNITS CPEP Take by mouth 3 (three) times daily.      . polyethylene glycol powder (GLYCOLAX/MIRALAX) powder Take 17 g by mouth daily as needed.  527 g  3  . predniSONE (DELTASONE) 5 MG tablet TAKE 1 TABLET BY MOUTH EVERY OTHER DAY  20 tablet  0  . traMADol (ULTRAM) 50 MG tablet Take 1 tablet (50 mg total) by mouth 2 (two) times daily.  15 tablet  0  . Vitamin D, Ergocalciferol, (DRISDOL) 50000 UNITS CAPS capsule TAKE 1 CAPSULE EVERY 7 DAYS  12 capsule  0   No current facility-administered medications on file prior to visit.     Review of Systems Review of Systems  Constitutional: Negative for fever, appetite change, and unexpected weight change. pos for  fatigue Eyes: Negative for pain and visual disturbance.  Respiratory: Negative for cough and and pos for baseline sob on exertion Cardiovascular: Negative for cp or palpitations   neg for pedal edema above baseline/ pos for wt gain after stopping lasix  Gastrointestinal: Negative for nausea, diarrhea and constipation.  Genitourinary: Negative for urgency and frequency. neg for dysuria or hematuria  Skin: Negative for pallor or rash   Neurological: Negative for weakness,  light-headedness, numbness and headaches.  Hematological: Negative for adenopathy. Does not bruise/bleed easily.  Psychiatric/Behavioral: Negative for dysphoric mood. The patient is not nervous/anxious.         Objective:   Physical Exam  Constitutional: She appears well-developed and well-nourished. No distress.  Frail appearing elderly female   HENT:  Head: Normocephalic and atraumatic.  Right Ear: External ear normal.  Left Ear: External ear normal.  Nose: Nose normal.  Mouth/Throat: Oropharynx is clear and moist.  Eyes: Conjunctivae and EOM are normal. Pupils are equal, round, and reactive to light. Right eye exhibits no discharge. Left eye exhibits no discharge. No scleral icterus.  Neck: Normal range of motion. Neck supple. No JVD present. Carotid bruit is not present. No thyromegaly present.  Cardiovascular: Normal rate and regular rhythm.  Exam reveals gallop.   Pulmonary/Chest: Effort normal and breath sounds normal. No respiratory distress. She has no wheezes. She has no rales. She exhibits no tenderness.  No crackles bs are somewhat distant  Abdominal: Soft. Bowel sounds are normal. She exhibits no distension, no abdominal bruit and no mass. There is no tenderness. There is no rebound and no guarding.  No suprapubic tenderness or fullness  No cva tenderness   Musculoskeletal: She exhibits edema.  Trace pedal edema with sock line which is her baseline  Lymphadenopathy:    She has no cervical adenopathy.   Neurological: She is alert. She has normal reflexes. She exhibits normal muscle tone. Coordination normal.  Skin: Skin is warm and dry. No rash noted. No erythema. No pallor.  Psychiatric: She has a normal mood and affect.          Assessment & Plan:

## 2014-03-02 NOTE — Patient Instructions (Addendum)
Please leave a urine sample on the way out  I removed the medicines from your list that they discontinued in the hospital  You have an infection that spread to your blood  Unsure what the source of that infection was - still pending the final blood culture result  Continue the levaquin and finish it all  Ask the ladies up front to send to [facility] for your discharge summary  See Dr Rockey Situ on 4/2 as planned to talk about your heart condition If swelling or shortness of breath on exertion worsen- please let cardiology know

## 2014-03-03 DIAGNOSIS — I509 Heart failure, unspecified: Secondary | ICD-10-CM | POA: Diagnosis not present

## 2014-03-03 DIAGNOSIS — Z9981 Dependence on supplemental oxygen: Secondary | ICD-10-CM | POA: Diagnosis not present

## 2014-03-03 DIAGNOSIS — I959 Hypotension, unspecified: Secondary | ICD-10-CM | POA: Diagnosis not present

## 2014-03-03 DIAGNOSIS — R32 Unspecified urinary incontinence: Secondary | ICD-10-CM | POA: Diagnosis not present

## 2014-03-03 DIAGNOSIS — J8410 Pulmonary fibrosis, unspecified: Secondary | ICD-10-CM | POA: Diagnosis not present

## 2014-03-03 DIAGNOSIS — M19049 Primary osteoarthritis, unspecified hand: Secondary | ICD-10-CM | POA: Diagnosis not present

## 2014-03-03 DIAGNOSIS — F411 Generalized anxiety disorder: Secondary | ICD-10-CM | POA: Diagnosis not present

## 2014-03-03 DIAGNOSIS — I428 Other cardiomyopathies: Secondary | ICD-10-CM | POA: Diagnosis not present

## 2014-03-03 DIAGNOSIS — J441 Chronic obstructive pulmonary disease with (acute) exacerbation: Secondary | ICD-10-CM | POA: Diagnosis not present

## 2014-03-03 DIAGNOSIS — I5023 Acute on chronic systolic (congestive) heart failure: Secondary | ICD-10-CM | POA: Diagnosis not present

## 2014-03-03 DIAGNOSIS — Z9181 History of falling: Secondary | ICD-10-CM | POA: Diagnosis not present

## 2014-03-03 DIAGNOSIS — I248 Other forms of acute ischemic heart disease: Secondary | ICD-10-CM | POA: Diagnosis not present

## 2014-03-03 DIAGNOSIS — Z8509 Personal history of malignant neoplasm of other digestive organs: Secondary | ICD-10-CM | POA: Diagnosis not present

## 2014-03-03 LAB — URINE CULTURE
Colony Count: NO GROWTH
ORGANISM ID, BACTERIA: NO GROWTH

## 2014-03-03 NOTE — Assessment & Plan Note (Signed)
Gm neg rods-pending org and sens Already feeling much better on levaquin started yesterday ua was nl at admit  Re check today- and cx sent as well  Will watch closely

## 2014-03-03 NOTE — Assessment & Plan Note (Signed)
Rev echo report from recent hosp  EF 25-30% Her edema and sob on exertion are at baseline  Off lasix -some wt gain/ cardiol aware -will continue daily wt at her residence

## 2014-03-03 NOTE — Assessment & Plan Note (Signed)
Pt was hypoxic at her recent hosp with signs of demand cardiac ischemia  No pneumonia  However - did have pos blood cx  On levaquin now -pend org and sens Reassuring exam

## 2014-03-07 ENCOUNTER — Ambulatory Visit: Payer: Medicare Other | Admitting: Family Medicine

## 2014-03-07 DIAGNOSIS — R32 Unspecified urinary incontinence: Secondary | ICD-10-CM | POA: Diagnosis not present

## 2014-03-07 DIAGNOSIS — I959 Hypotension, unspecified: Secondary | ICD-10-CM | POA: Diagnosis not present

## 2014-03-07 DIAGNOSIS — I509 Heart failure, unspecified: Secondary | ICD-10-CM | POA: Diagnosis not present

## 2014-03-07 DIAGNOSIS — J441 Chronic obstructive pulmonary disease with (acute) exacerbation: Secondary | ICD-10-CM | POA: Diagnosis not present

## 2014-03-07 DIAGNOSIS — F411 Generalized anxiety disorder: Secondary | ICD-10-CM | POA: Diagnosis not present

## 2014-03-07 DIAGNOSIS — I5023 Acute on chronic systolic (congestive) heart failure: Secondary | ICD-10-CM | POA: Diagnosis not present

## 2014-03-08 DIAGNOSIS — F411 Generalized anxiety disorder: Secondary | ICD-10-CM | POA: Diagnosis not present

## 2014-03-08 DIAGNOSIS — I959 Hypotension, unspecified: Secondary | ICD-10-CM | POA: Diagnosis not present

## 2014-03-08 DIAGNOSIS — J441 Chronic obstructive pulmonary disease with (acute) exacerbation: Secondary | ICD-10-CM | POA: Diagnosis not present

## 2014-03-08 DIAGNOSIS — I509 Heart failure, unspecified: Secondary | ICD-10-CM | POA: Diagnosis not present

## 2014-03-08 DIAGNOSIS — I5023 Acute on chronic systolic (congestive) heart failure: Secondary | ICD-10-CM | POA: Diagnosis not present

## 2014-03-08 DIAGNOSIS — R32 Unspecified urinary incontinence: Secondary | ICD-10-CM | POA: Diagnosis not present

## 2014-03-10 ENCOUNTER — Encounter: Payer: Self-pay | Admitting: Cardiovascular Disease

## 2014-03-10 ENCOUNTER — Ambulatory Visit (INDEPENDENT_AMBULATORY_CARE_PROVIDER_SITE_OTHER): Payer: Medicare Other | Admitting: Cardiovascular Disease

## 2014-03-10 VITALS — BP 90/70 | HR 99 | Ht 60.0 in | Wt 135.2 lb

## 2014-03-10 DIAGNOSIS — R0602 Shortness of breath: Secondary | ICD-10-CM

## 2014-03-10 DIAGNOSIS — R32 Unspecified urinary incontinence: Secondary | ICD-10-CM | POA: Diagnosis not present

## 2014-03-10 DIAGNOSIS — I5022 Chronic systolic (congestive) heart failure: Secondary | ICD-10-CM

## 2014-03-10 DIAGNOSIS — J439 Emphysema, unspecified: Secondary | ICD-10-CM

## 2014-03-10 DIAGNOSIS — J438 Other emphysema: Secondary | ICD-10-CM | POA: Diagnosis not present

## 2014-03-10 DIAGNOSIS — R7881 Bacteremia: Secondary | ICD-10-CM

## 2014-03-10 DIAGNOSIS — I951 Orthostatic hypotension: Secondary | ICD-10-CM

## 2014-03-10 DIAGNOSIS — J441 Chronic obstructive pulmonary disease with (acute) exacerbation: Secondary | ICD-10-CM | POA: Diagnosis not present

## 2014-03-10 DIAGNOSIS — I509 Heart failure, unspecified: Secondary | ICD-10-CM

## 2014-03-10 DIAGNOSIS — I959 Hypotension, unspecified: Secondary | ICD-10-CM | POA: Diagnosis not present

## 2014-03-10 DIAGNOSIS — F411 Generalized anxiety disorder: Secondary | ICD-10-CM | POA: Diagnosis not present

## 2014-03-10 DIAGNOSIS — I5023 Acute on chronic systolic (congestive) heart failure: Secondary | ICD-10-CM | POA: Diagnosis not present

## 2014-03-10 NOTE — Assessment & Plan Note (Signed)
Enterobacter grown on recent hospital admission in the setting of chills, diaphoresis, Rigors. Currently feels better on Levaquin. Sensitivities showed Levaquin should help with her infection. We will see her back one week after she stops her antibiotics to make sure she has no recurrent infection. We have asked her to call our office or Dr. Glori Bickers for any chills, fever, diaphoresis.

## 2014-03-10 NOTE — Assessment & Plan Note (Signed)
Low blood pressure in the setting of cardiomyopathy, resolving bacteremia, still on Levaquin for 5 more days. We have suggested she not restart her lisinopril or her diuretic at this time. We'll see her back in close followup.

## 2014-03-10 NOTE — Patient Instructions (Signed)
You are doing well. No medication changes were made.  Next week when antibiotics are done,  If you have any sweating episodes, shaking or chills, or burning up, Call the office  Please call us if you have new issues that need to be addressed before your next appt.  Your physician wants you to follow-up in: 7 to 10 days

## 2014-03-10 NOTE — Assessment & Plan Note (Signed)
Chronic mild shortness of breath at baseline. Likely exacerbated by cardiomyopathy and depressed ejection fraction. Appears stable on today's visit

## 2014-03-10 NOTE — Progress Notes (Signed)
Patient ID: Bridget Walker, female    DOB: Apr 30, 1925, 80 y.o.   MRN: [id number]  HPI Comments: [name] is a 80 year old woman with history of large hiatal hernia, underlying pulmonary disease/COPD on chronic prednisone, chronic pancreatitis who lives at [facility],  admission to the hospital 10/01/2013 with discharge 10/04/2013 with shortness of breath. Diagnosed with cardiomyopathy .Cardiac catheterization 10/04/2013 showed no significant CAD  Echocardiogram showed ejection fraction 30%. She was given diuretics with subsequent acute renal failure.  creatinine increased from 1.15 up to 2.58. BUN increased from 28 to 47 . Diuretics were held, fluids initiated with improvement of her renal function back to her baseline . readmitted to the hospital 10/06/2013 for hypotension felt secondary to medications. Systolic pressure was 80. She was given fluids, Lasix was held temporarily. Hold parameters placed on her medications .  Recent hospital admission for bacteremia, Riggers, diaphoresis, chills fever. On evaluation by myself , she was having the symptoms and blood cultures were ordered.  she was started on IV fluids for hypotension/bacteremia She was initially discharged as blood cultures were negative but they eventually grew gram-negative rods, Enterobacter cloacae. As an outpatient she was started on Levaquin for 14 days. She reports that she immediately felt better. Her weight has been improving and she is concerned about fluid retention. Blood pressure on recent for 2 primary care was 124 systolic. Systolic pressure today is 90. She denies any dizziness or lightheadedness. Lisinopril and Lasix were held while she was in the hospital. She is tolerating low-dose carvedilol  In followup today, she is [facility] and moving back to assisted living. She takes Lasix 20 mg daily. Her weight has been relatively stable at 126 pounds. Blood pressure has been running low but she denies any dizziness or  orthostatic type symptoms. Minimal lower extremity edema. She does report having shortness of breath with exertion.  Echocardiogram from the hospital 10/01/2013 shows ejection fraction 30-35%, mention of possible anterior wall hypokinesis. This was not noted on cardiac catheterization. Repeat echocardiogram 02/23/2014 shows ejection fraction 25-30%, mildly elevated right ventricular systolic pressures, mild aortic valve stenosi for elevated cardiac enzymess  EKG today shows normal sinus rhythm with rate 99 beats a minute with nonspecific ST ML the    Outpatient Encounter Prescriptions as of 03/10/2014  Medication Sig  . acetaminophen (TYLENOL) 500 MG tablet Take by mouth. 1-2 tab every 6 hrs prn  . ALPRAZolam (XANAX) 0.5 MG tablet Take 0.5 mg by mouth 3 (three) times daily as needed for anxiety.  Marland Kitchen aspirin EC 81 MG tablet Take 81 mg by mouth daily.  . Calcium Carbonate-Vitamin D (CALTRATE 600+D PO) Take 2 tablets by mouth daily.  . carvedilol (COREG) 3.125 MG tablet Take 3.125 mg by mouth 2 (two) times daily with a meal.  . esomeprazole (NEXIUM) 40 MG capsule TAKE 1 CAPSULE (40 MG TOTAL) BY MOUTH DAILY BEFORE BREAKFAST.  Marland Kitchen guaiFENesin (MUCINEX) 600 MG 12 hr tablet Take 600 mg by mouth as needed. For cough and increased mucus  . megestrol (MEGACE) 40 MG/ML suspension Take 10 mLs (400 mg total) by mouth daily.  . meloxicam (MOBIC) 7.5 MG tablet TAKE 1 TABLET DAILY  . NON FORMULARY Oxygen 2 liters at bedtimes.  . Pancrelipase, Lip-Prot-Amyl, 3000-10000 UNITS CPEP Take by mouth 3 (three) times daily.  . polyethylene glycol powder (GLYCOLAX/MIRALAX) powder Take 17 g by mouth daily as needed.  . predniSONE (DELTASONE) 5 MG tablet TAKE 1 TABLET BY MOUTH EVERY OTHER DAY  .  traMADol (ULTRAM) 50 MG tablet Take 1 tablet (50 mg total) by mouth 2 (two) times daily.  . Vitamin D, Ergocalciferol, (DRISDOL) 50000 UNITS CAPS capsule TAKE 1 CAPSULE EVERY 7 DAYS    Review of Systems  Constitutional:  Negative.   HENT: Negative.   Eyes: Negative.   Respiratory: Positive for shortness of breath.   Cardiovascular: Negative.   Gastrointestinal: Negative.   Endocrine: Negative.   Musculoskeletal: Positive for gait problem.  Skin: Negative.   Allergic/Immunologic: Negative.   Neurological: Negative.   Hematological: Negative.   Psychiatric/Behavioral: Negative.   All other systems reviewed and are negative.    BP 90/70  Pulse 99  Ht 5' (1.524 m)  Wt 135 lb 4 oz (61.349 kg)  BMI 26.41 kg/m2  Physical Exam  Nursing note and vitals reviewed. Constitutional: She is oriented to person, place, and time. She appears well-developed and well-nourished.  Unstable gait  HENT:  Head: Normocephalic.  Nose: Nose normal.  Mouth/Throat: Oropharynx is clear and moist.  Eyes: Conjunctivae are normal. Pupils are equal, round, and reactive to light.  Neck: Normal range of motion. Neck supple. No JVD present.  Cardiovascular: Normal rate, regular rhythm, S1 normal, S2 normal, normal heart sounds and intact distal pulses.  Exam reveals no gallop and no friction rub.   No murmur heard. Pulmonary/Chest: Effort normal and breath sounds normal. No respiratory distress. She has no wheezes. She has no rales. She exhibits no tenderness.  Abdominal: Soft. Bowel sounds are normal. She exhibits no distension. There is no tenderness.  Musculoskeletal: Normal range of motion. She exhibits no edema and no tenderness.  Lymphadenopathy:    She has no cervical adenopathy.  Neurological: She is alert and oriented to person, place, and time. Coordination normal.  Skin: Skin is warm and dry. No rash noted. No erythema.  Psychiatric: She has a normal mood and affect. Her behavior is normal. Judgment and thought content normal.    Assessment and Plan

## 2014-03-10 NOTE — Assessment & Plan Note (Signed)
Nonischemic cardiomyopathy, currently appears euvolemic. As blood pressure is low we will not restart lisinopril or Lasix. Unable to exclude resolving bacteremia. She has 5 more days of Levaquin remaining.

## 2014-03-11 ENCOUNTER — Telehealth: Payer: Self-pay | Admitting: *Deleted

## 2014-03-11 NOTE — Telephone Encounter (Signed)
Spoke w/ Leafy Ro, RN.  She wanted to make sure that pt presented to her ov yesterday w/ her dialy wt log. Advised her that pt's wt yesterday was 135 and Dr. Rockey Situ advised her to hold HCTZ due to low BP. She will call us w/ any changes to pt's status.

## 2014-03-11 NOTE — Telephone Encounter (Signed)
Nurse called stating patient's weight is up to 135 and been taken off lasix's, please call.

## 2014-03-14 ENCOUNTER — Telehealth: Payer: Self-pay | Admitting: Internal Medicine

## 2014-03-14 DIAGNOSIS — J449 Chronic obstructive pulmonary disease, unspecified: Secondary | ICD-10-CM

## 2014-03-14 NOTE — Telephone Encounter (Signed)
LMTCB

## 2014-03-15 DIAGNOSIS — I5023 Acute on chronic systolic (congestive) heart failure: Secondary | ICD-10-CM | POA: Diagnosis not present

## 2014-03-15 DIAGNOSIS — F411 Generalized anxiety disorder: Secondary | ICD-10-CM | POA: Diagnosis not present

## 2014-03-15 DIAGNOSIS — J441 Chronic obstructive pulmonary disease with (acute) exacerbation: Secondary | ICD-10-CM | POA: Diagnosis not present

## 2014-03-15 DIAGNOSIS — R32 Unspecified urinary incontinence: Secondary | ICD-10-CM | POA: Diagnosis not present

## 2014-03-15 DIAGNOSIS — I509 Heart failure, unspecified: Secondary | ICD-10-CM | POA: Diagnosis not present

## 2014-03-15 DIAGNOSIS — I959 Hypotension, unspecified: Secondary | ICD-10-CM | POA: Diagnosis not present

## 2014-03-15 NOTE — Telephone Encounter (Signed)
Spoke with nurse, Leafy Ro  She states that the pt has been having minor nose bleeds and dryness in nose from o2 use  Can we send order to DME to add humidity to o2? Also, nursing facility is requesting that we fax them an order for the pt to be able to use saline NS prn  Please advise thanks!

## 2014-03-15 NOTE — Telephone Encounter (Signed)
Order for nasal saline has been faxed over.  Order placed for humidifier as well. Nothing further needed

## 2014-03-15 NOTE — Telephone Encounter (Signed)
LMTC x 1 for [facility]

## 2014-03-15 NOTE — Telephone Encounter (Signed)
Ok - DME order humidifier for O2, dx COPD  Ok - order nasal saline spray 1-2 puffs each nostril twice daily and prn

## 2014-03-16 DIAGNOSIS — I509 Heart failure, unspecified: Secondary | ICD-10-CM | POA: Diagnosis not present

## 2014-03-16 DIAGNOSIS — J441 Chronic obstructive pulmonary disease with (acute) exacerbation: Secondary | ICD-10-CM | POA: Diagnosis not present

## 2014-03-16 DIAGNOSIS — R32 Unspecified urinary incontinence: Secondary | ICD-10-CM | POA: Diagnosis not present

## 2014-03-16 DIAGNOSIS — F411 Generalized anxiety disorder: Secondary | ICD-10-CM | POA: Diagnosis not present

## 2014-03-16 DIAGNOSIS — I959 Hypotension, unspecified: Secondary | ICD-10-CM | POA: Diagnosis not present

## 2014-03-16 DIAGNOSIS — I5023 Acute on chronic systolic (congestive) heart failure: Secondary | ICD-10-CM | POA: Diagnosis not present

## 2014-03-17 ENCOUNTER — Ambulatory Visit: Payer: Medicare Other | Admitting: Cardiovascular Disease

## 2014-03-17 DIAGNOSIS — I959 Hypotension, unspecified: Secondary | ICD-10-CM | POA: Diagnosis not present

## 2014-03-17 DIAGNOSIS — F411 Generalized anxiety disorder: Secondary | ICD-10-CM | POA: Diagnosis not present

## 2014-03-17 DIAGNOSIS — I509 Heart failure, unspecified: Secondary | ICD-10-CM | POA: Diagnosis not present

## 2014-03-17 DIAGNOSIS — R32 Unspecified urinary incontinence: Secondary | ICD-10-CM | POA: Diagnosis not present

## 2014-03-17 DIAGNOSIS — I5023 Acute on chronic systolic (congestive) heart failure: Secondary | ICD-10-CM | POA: Diagnosis not present

## 2014-03-17 DIAGNOSIS — J441 Chronic obstructive pulmonary disease with (acute) exacerbation: Secondary | ICD-10-CM | POA: Diagnosis not present

## 2014-03-18 DIAGNOSIS — J441 Chronic obstructive pulmonary disease with (acute) exacerbation: Secondary | ICD-10-CM | POA: Diagnosis not present

## 2014-03-18 DIAGNOSIS — F411 Generalized anxiety disorder: Secondary | ICD-10-CM | POA: Diagnosis not present

## 2014-03-18 DIAGNOSIS — I959 Hypotension, unspecified: Secondary | ICD-10-CM | POA: Diagnosis not present

## 2014-03-18 DIAGNOSIS — I509 Heart failure, unspecified: Secondary | ICD-10-CM | POA: Diagnosis not present

## 2014-03-18 DIAGNOSIS — I5023 Acute on chronic systolic (congestive) heart failure: Secondary | ICD-10-CM | POA: Diagnosis not present

## 2014-03-18 DIAGNOSIS — R32 Unspecified urinary incontinence: Secondary | ICD-10-CM | POA: Diagnosis not present

## 2014-03-21 DIAGNOSIS — I959 Hypotension, unspecified: Secondary | ICD-10-CM | POA: Diagnosis not present

## 2014-03-21 DIAGNOSIS — I509 Heart failure, unspecified: Secondary | ICD-10-CM | POA: Diagnosis not present

## 2014-03-21 DIAGNOSIS — J441 Chronic obstructive pulmonary disease with (acute) exacerbation: Secondary | ICD-10-CM | POA: Diagnosis not present

## 2014-03-21 DIAGNOSIS — F411 Generalized anxiety disorder: Secondary | ICD-10-CM | POA: Diagnosis not present

## 2014-03-21 DIAGNOSIS — R32 Unspecified urinary incontinence: Secondary | ICD-10-CM | POA: Diagnosis not present

## 2014-03-21 DIAGNOSIS — I5023 Acute on chronic systolic (congestive) heart failure: Secondary | ICD-10-CM | POA: Diagnosis not present

## 2014-03-22 ENCOUNTER — Ambulatory Visit (INDEPENDENT_AMBULATORY_CARE_PROVIDER_SITE_OTHER): Payer: Medicare Other | Admitting: Cardiovascular Disease

## 2014-03-22 ENCOUNTER — Encounter: Payer: Self-pay | Admitting: Cardiovascular Disease

## 2014-03-22 VITALS — BP 122/76 | HR 90 | Ht 60.0 in | Wt 135.2 lb

## 2014-03-22 DIAGNOSIS — I5022 Chronic systolic (congestive) heart failure: Secondary | ICD-10-CM

## 2014-03-22 DIAGNOSIS — I509 Heart failure, unspecified: Secondary | ICD-10-CM | POA: Diagnosis not present

## 2014-03-22 DIAGNOSIS — I951 Orthostatic hypotension: Secondary | ICD-10-CM

## 2014-03-22 DIAGNOSIS — J438 Other emphysema: Secondary | ICD-10-CM

## 2014-03-22 DIAGNOSIS — J439 Emphysema, unspecified: Secondary | ICD-10-CM

## 2014-03-22 MED ORDER — POTASSIUM CHLORIDE ER 10 MEQ PO TBCR: 10.0000 meq | EXTENDED_RELEASE_TABLET | Freq: Every day | ORAL | Status: DC

## 2014-03-22 MED ORDER — FUROSEMIDE 20 MG PO TABS: 20.0000 mg | ORAL_TABLET | Freq: Every day | ORAL | Status: DC

## 2014-03-22 MED ORDER — LISINOPRIL 2.5 MG PO TABS: 2.5000 mg | ORAL_TABLET | Freq: Every day | ORAL | Status: DC

## 2014-03-22 NOTE — Patient Instructions (Addendum)
We will restart your medications that you were taking previously  (prior to the infection)  Please restart lisinopril 2.5 mg daily Please restart lasix 20 mg daily for shortness of breath (hold lasix for weight less than 130 pounds) Please take lasix with potassium 10 meq daily (hold potassium for no lasix)  Please call us if you have new issues that need to be addressed before your next appt.  Your physician wants you to follow-up in: 3 months.  You will receive a reminder letter in the mail two months in advance. If you don't receive a letter, please call our office to schedule the follow-up appointment.

## 2014-03-22 NOTE — Assessment & Plan Note (Signed)
Stable COPD, on chronic prednisone

## 2014-03-22 NOTE — Assessment & Plan Note (Signed)
As her bacteremia has resolved with a blood pressure has returned to normal, we will restart her Lasix daily with low-dose lisinopril, carvedilol twice a day. She does have slight worsening shortness of breath and leg edema. We have suggested she hold her Lasix for weight less than 130 pounds. Weight currently 133-134 pounds

## 2014-03-22 NOTE — Progress Notes (Signed)
Patient ID: DIMA MINI, female    DOB: May 22, 1925, 80 y.o.   MRN: [id number]  HPI Comments: Bridget Walker is a 80 year old woman with history of large hiatal hernia, underlying pulmonary disease/COPD on chronic prednisone, chronic pancreatitis who lives at [facility],  admission to the hospital 10/01/2013 with discharge 10/04/2013 with shortness of breath. Diagnosed with cardiomyopathy .Cardiac catheterization 10/04/2013 showed no significant CAD  Echocardiogram showed ejection fraction 30%. She was given diuretics with subsequent acute renal failure.  creatinine increased from 1.15 up to 2.58. BUN increased from 28 to 47 . Diuretics were held, fluids initiated with improvement of her renal function back to her baseline . readmitted to the hospital 10/06/2013 for hypotension felt secondary to medications. Systolic pressure was 80. She was given fluids, Lasix was held temporarily. Hold parameters placed on her medications .  Recent hospital admission for bacteremia, rigors, diaphoresis, chills fever. On evaluation , she was having the symptoms and blood cultures were ordered.  she was started on IV fluids for hypotension/bacteremia She was initially discharged as blood cultures were negative but they eventually grew gram-negative rods, Enterobacter cloacae. As an outpatient she was started on Levaquin for 14 days. She reports that she immediately felt better.   On her last clinic visit, she is feeling better but blood pressures were low. Lisinopril and Lasix were held in the hospital. She was tolerating low-dose Coreg. In followup today, blood pressure has improved back to her baseline. She reports having shortness of breath with exertion, slight worsening of her leg edema. No further rigors or chills concerning for infection. Overall she feels that she is back to baseline  Echocardiogram from the hospital 10/01/2013 shows ejection fraction 30-35%, mention of possible anterior wall hypokinesis. This was not  noted on cardiac catheterization. Repeat echocardiogram 02/23/2014 shows ejection fraction 25-30%, mildly elevated right ventricular systolic pressures, mild aortic valve stenosi for elevated cardiac enzymes  EKG today shows normal sinus rhythm with  with nonspecific ST Abnormality    Outpatient Encounter Prescriptions as of 03/22/2014  Medication Sig  . acetaminophen (TYLENOL) 500 MG tablet Take by mouth. 1-2 tab every 6 hrs prn  . ALPRAZolam (XANAX) 0.5 MG tablet Take 0.5 mg by mouth 3 (three) times daily as needed for anxiety.  Marland Kitchen aspirin EC 81 MG tablet Take 81 mg by mouth daily.  . Calcium Carbonate-Vitamin D (CALTRATE 600+D PO) Take 2 tablets by mouth daily.  . carvedilol (COREG) 3.125 MG tablet Take 3.125 mg by mouth 2 (two) times daily with a meal.  . esomeprazole (NEXIUM) 40 MG capsule TAKE 1 CAPSULE (40 MG TOTAL) BY MOUTH DAILY BEFORE BREAKFAST.  . megestrol (MEGACE) 40 MG/ML suspension Take 10 mLs (400 mg total) by mouth daily.  . meloxicam (MOBIC) 7.5 MG tablet TAKE 1 TABLET DAILY  . NON FORMULARY Oxygen 2 liters at bedtimes.  . Pancrelipase, Lip-Prot-Amyl, 3000-10000 UNITS CPEP Take by mouth 3 (three) times daily.  . polyethylene glycol powder (GLYCOLAX/MIRALAX) powder Take 17 g by mouth daily as needed.  . predniSONE (DELTASONE) 5 MG tablet TAKE 1 TABLET BY MOUTH EVERY OTHER DAY  . traMADol (ULTRAM) 50 MG tablet Take 1 tablet (50 mg total) by mouth 2 (two) times daily.  . Vitamin D, Ergocalciferol, (DRISDOL) 50000 UNITS CAPS capsule TAKE 1 CAPSULE EVERY 7 DAYS   Review of Systems  Constitutional: Negative.   HENT: Negative.   Eyes: Negative.   Respiratory: Positive for shortness of breath.   Cardiovascular: Positive for leg swelling.  Gastrointestinal: Negative.   Endocrine: Negative.   Musculoskeletal: Positive for gait problem.  Skin: Negative.   Allergic/Immunologic: Negative.   Neurological: Negative.   Hematological: Negative.   Psychiatric/Behavioral: Negative.    All other systems reviewed and are negative.   BP 122/76  Pulse 90  Ht 5' (1.524 m)  Wt 135 lb 4 oz (61.349 kg)  BMI 26.41 kg/m2  Physical Exam  Nursing note and vitals reviewed. Constitutional: She is oriented to person, place, and time. She appears well-developed and well-nourished.  Unstable gait  HENT:  Head: Normocephalic.  Nose: Nose normal.  Mouth/Throat: Oropharynx is clear and moist.  Eyes: Conjunctivae are normal. Pupils are equal, round, and reactive to light.  Neck: Normal range of motion. Neck supple. No JVD present.  Cardiovascular: Normal rate, regular rhythm, S1 normal, S2 normal, normal heart sounds and intact distal pulses.  Exam reveals no gallop and no friction rub.   No murmur heard. Trace lower extremity edema bilaterally  Pulmonary/Chest: Effort normal and breath sounds normal. No respiratory distress. She has no wheezes. She has no rales. She exhibits no tenderness.  Abdominal: Soft. Bowel sounds are normal. She exhibits no distension. There is no tenderness.  Musculoskeletal: Normal range of motion. She exhibits no edema and no tenderness.  Lymphadenopathy:    She has no cervical adenopathy.  Neurological: She is alert and oriented to person, place, and time. Coordination normal.  Skin: Skin is warm and dry. No rash noted. No erythema.  Psychiatric: She has a normal mood and affect. Her behavior is normal. Judgment and thought content normal.    Assessment and Plan

## 2014-03-22 NOTE — Assessment & Plan Note (Signed)
Orthostatic hypotension resolved. Likely from resolving infection. I did discuss this with infectious disease and they were concerned about a GI etiology. Currently with no symptoms

## 2014-03-23 ENCOUNTER — Other Ambulatory Visit: Payer: Self-pay | Admitting: *Deleted

## 2014-03-23 MED ORDER — CARVEDILOL 3.125 MG PO TABS: 3.1250 mg | ORAL_TABLET | Freq: Two times a day (BID) | ORAL | Status: DC

## 2014-03-24 ENCOUNTER — Other Ambulatory Visit: Payer: Self-pay | Admitting: *Deleted

## 2014-03-24 ENCOUNTER — Telehealth: Payer: Self-pay

## 2014-03-24 DIAGNOSIS — J441 Chronic obstructive pulmonary disease with (acute) exacerbation: Secondary | ICD-10-CM | POA: Diagnosis not present

## 2014-03-24 DIAGNOSIS — F411 Generalized anxiety disorder: Secondary | ICD-10-CM | POA: Diagnosis not present

## 2014-03-24 DIAGNOSIS — I5023 Acute on chronic systolic (congestive) heart failure: Secondary | ICD-10-CM | POA: Diagnosis not present

## 2014-03-24 DIAGNOSIS — I959 Hypotension, unspecified: Secondary | ICD-10-CM | POA: Diagnosis not present

## 2014-03-24 DIAGNOSIS — R32 Unspecified urinary incontinence: Secondary | ICD-10-CM | POA: Diagnosis not present

## 2014-03-24 DIAGNOSIS — I509 Heart failure, unspecified: Secondary | ICD-10-CM | POA: Diagnosis not present

## 2014-03-24 MED ORDER — CARVEDILOL 3.125 MG PO TABS: 3.1250 mg | ORAL_TABLET | Freq: Two times a day (BID) | ORAL | Status: DC

## 2014-03-24 NOTE — Telephone Encounter (Signed)
Left message for Katie to call back.

## 2014-03-24 NOTE — Telephone Encounter (Signed)
Requested Prescriptions   Signed Prescriptions Disp Refills  . carvedilol (COREG) 3.125 MG tablet 180 tablet 3    Sig: Take 1 tablet (3.125 mg total) by mouth 2 (two) times daily with a meal.    Authorizing Provider: Minna Merritts    Ordering User: Britt Bottom

## 2014-03-24 NOTE — Telephone Encounter (Signed)
Bridget Walker asks that refill for carvedilol be sent to [facility].

## 2014-03-24 NOTE — Telephone Encounter (Signed)
Nurse from [facility] called and has a question regarding her Carvedilol.

## 2014-03-25 DIAGNOSIS — I5023 Acute on chronic systolic (congestive) heart failure: Secondary | ICD-10-CM | POA: Diagnosis not present

## 2014-03-25 DIAGNOSIS — I509 Heart failure, unspecified: Secondary | ICD-10-CM | POA: Diagnosis not present

## 2014-03-25 DIAGNOSIS — R32 Unspecified urinary incontinence: Secondary | ICD-10-CM | POA: Diagnosis not present

## 2014-03-25 DIAGNOSIS — J441 Chronic obstructive pulmonary disease with (acute) exacerbation: Secondary | ICD-10-CM | POA: Diagnosis not present

## 2014-03-25 DIAGNOSIS — I959 Hypotension, unspecified: Secondary | ICD-10-CM | POA: Diagnosis not present

## 2014-03-25 DIAGNOSIS — F411 Generalized anxiety disorder: Secondary | ICD-10-CM | POA: Diagnosis not present

## 2014-03-26 DIAGNOSIS — I509 Heart failure, unspecified: Secondary | ICD-10-CM

## 2014-03-26 DIAGNOSIS — R7881 Bacteremia: Secondary | ICD-10-CM

## 2014-03-26 DIAGNOSIS — I5022 Chronic systolic (congestive) heart failure: Secondary | ICD-10-CM

## 2014-03-26 DIAGNOSIS — J438 Other emphysema: Secondary | ICD-10-CM | POA: Diagnosis not present

## 2014-03-29 DIAGNOSIS — I959 Hypotension, unspecified: Secondary | ICD-10-CM | POA: Diagnosis not present

## 2014-03-29 DIAGNOSIS — R32 Unspecified urinary incontinence: Secondary | ICD-10-CM | POA: Diagnosis not present

## 2014-03-29 DIAGNOSIS — F411 Generalized anxiety disorder: Secondary | ICD-10-CM | POA: Diagnosis not present

## 2014-03-29 DIAGNOSIS — J441 Chronic obstructive pulmonary disease with (acute) exacerbation: Secondary | ICD-10-CM | POA: Diagnosis not present

## 2014-03-29 DIAGNOSIS — I509 Heart failure, unspecified: Secondary | ICD-10-CM | POA: Diagnosis not present

## 2014-03-29 DIAGNOSIS — I5023 Acute on chronic systolic (congestive) heart failure: Secondary | ICD-10-CM | POA: Diagnosis not present

## 2014-03-30 ENCOUNTER — Encounter: Payer: Self-pay | Admitting: Adult Health

## 2014-03-31 ENCOUNTER — Telehealth: Payer: Self-pay | Admitting: Internal Medicine

## 2014-03-31 DIAGNOSIS — I5023 Acute on chronic systolic (congestive) heart failure: Secondary | ICD-10-CM | POA: Diagnosis not present

## 2014-03-31 DIAGNOSIS — J441 Chronic obstructive pulmonary disease with (acute) exacerbation: Secondary | ICD-10-CM | POA: Diagnosis not present

## 2014-03-31 DIAGNOSIS — I509 Heart failure, unspecified: Secondary | ICD-10-CM | POA: Diagnosis not present

## 2014-03-31 DIAGNOSIS — F411 Generalized anxiety disorder: Secondary | ICD-10-CM | POA: Diagnosis not present

## 2014-03-31 DIAGNOSIS — I959 Hypotension, unspecified: Secondary | ICD-10-CM | POA: Diagnosis not present

## 2014-03-31 DIAGNOSIS — R32 Unspecified urinary incontinence: Secondary | ICD-10-CM | POA: Diagnosis not present

## 2014-03-31 NOTE — Telephone Encounter (Signed)
ATC, line rings multiple times with NA and no option to leave a msg

## 2014-04-01 DIAGNOSIS — I959 Hypotension, unspecified: Secondary | ICD-10-CM | POA: Diagnosis not present

## 2014-04-01 DIAGNOSIS — F411 Generalized anxiety disorder: Secondary | ICD-10-CM | POA: Diagnosis not present

## 2014-04-01 DIAGNOSIS — I5023 Acute on chronic systolic (congestive) heart failure: Secondary | ICD-10-CM | POA: Diagnosis not present

## 2014-04-01 DIAGNOSIS — R32 Unspecified urinary incontinence: Secondary | ICD-10-CM | POA: Diagnosis not present

## 2014-04-01 DIAGNOSIS — I509 Heart failure, unspecified: Secondary | ICD-10-CM | POA: Diagnosis not present

## 2014-04-01 DIAGNOSIS — J441 Chronic obstructive pulmonary disease with (acute) exacerbation: Secondary | ICD-10-CM | POA: Diagnosis not present

## 2014-04-01 NOTE — Telephone Encounter (Signed)
lmomtcb x1 for [name] 

## 2014-04-01 NOTE — Telephone Encounter (Signed)
[name] returned call.

## 2014-04-01 NOTE — Telephone Encounter (Signed)
She has O2 for sleep. If needed, it would be ok to have her wear O2 2l at home. We have not qualified her for portable O2 so far.

## 2014-04-01 NOTE — Telephone Encounter (Signed)
[email or web address].Jennifer Castillo, CMA  

## 2014-04-01 NOTE — Telephone Encounter (Signed)
Spoke w/[name] at Nurse at [facility]-- [phone]  Wanted to give FYI of O2 sats O2 has been dropping below 90% to 88-89% x 1 week with any type of exertion. Pt has not exhibited any signs of being sick.  Leafy Ro states that they have noticed patient to be more agitated and anxious recently--not sure if this could be contributing to her low O2 sats  Dr Annamaria Boots please advise if any recs. Thanks.

## 2014-04-01 NOTE — Telephone Encounter (Signed)
Called made [facility] aware of recs. Nothing further needed

## 2014-04-04 DIAGNOSIS — I959 Hypotension, unspecified: Secondary | ICD-10-CM | POA: Diagnosis not present

## 2014-04-04 DIAGNOSIS — I509 Heart failure, unspecified: Secondary | ICD-10-CM | POA: Diagnosis not present

## 2014-04-04 DIAGNOSIS — I5023 Acute on chronic systolic (congestive) heart failure: Secondary | ICD-10-CM | POA: Diagnosis not present

## 2014-04-04 DIAGNOSIS — J441 Chronic obstructive pulmonary disease with (acute) exacerbation: Secondary | ICD-10-CM | POA: Diagnosis not present

## 2014-04-04 DIAGNOSIS — R32 Unspecified urinary incontinence: Secondary | ICD-10-CM | POA: Diagnosis not present

## 2014-04-04 DIAGNOSIS — F411 Generalized anxiety disorder: Secondary | ICD-10-CM | POA: Diagnosis not present

## 2014-04-05 DIAGNOSIS — R32 Unspecified urinary incontinence: Secondary | ICD-10-CM | POA: Diagnosis not present

## 2014-04-05 DIAGNOSIS — F411 Generalized anxiety disorder: Secondary | ICD-10-CM | POA: Diagnosis not present

## 2014-04-05 DIAGNOSIS — I509 Heart failure, unspecified: Secondary | ICD-10-CM | POA: Diagnosis not present

## 2014-04-05 DIAGNOSIS — I959 Hypotension, unspecified: Secondary | ICD-10-CM | POA: Diagnosis not present

## 2014-04-05 DIAGNOSIS — I5023 Acute on chronic systolic (congestive) heart failure: Secondary | ICD-10-CM | POA: Diagnosis not present

## 2014-04-05 DIAGNOSIS — J441 Chronic obstructive pulmonary disease with (acute) exacerbation: Secondary | ICD-10-CM | POA: Diagnosis not present

## 2014-04-12 DIAGNOSIS — I5023 Acute on chronic systolic (congestive) heart failure: Secondary | ICD-10-CM | POA: Diagnosis not present

## 2014-04-12 DIAGNOSIS — I509 Heart failure, unspecified: Secondary | ICD-10-CM | POA: Diagnosis not present

## 2014-04-12 DIAGNOSIS — J441 Chronic obstructive pulmonary disease with (acute) exacerbation: Secondary | ICD-10-CM | POA: Diagnosis not present

## 2014-04-12 DIAGNOSIS — R32 Unspecified urinary incontinence: Secondary | ICD-10-CM | POA: Diagnosis not present

## 2014-04-12 DIAGNOSIS — I959 Hypotension, unspecified: Secondary | ICD-10-CM | POA: Diagnosis not present

## 2014-04-12 DIAGNOSIS — F411 Generalized anxiety disorder: Secondary | ICD-10-CM | POA: Diagnosis not present

## 2014-04-14 DIAGNOSIS — I5023 Acute on chronic systolic (congestive) heart failure: Secondary | ICD-10-CM | POA: Diagnosis not present

## 2014-04-14 DIAGNOSIS — I509 Heart failure, unspecified: Secondary | ICD-10-CM | POA: Diagnosis not present

## 2014-04-14 DIAGNOSIS — J441 Chronic obstructive pulmonary disease with (acute) exacerbation: Secondary | ICD-10-CM | POA: Diagnosis not present

## 2014-04-14 DIAGNOSIS — R32 Unspecified urinary incontinence: Secondary | ICD-10-CM | POA: Diagnosis not present

## 2014-04-14 DIAGNOSIS — F411 Generalized anxiety disorder: Secondary | ICD-10-CM | POA: Diagnosis not present

## 2014-04-14 DIAGNOSIS — I959 Hypotension, unspecified: Secondary | ICD-10-CM | POA: Diagnosis not present

## 2014-04-23 ENCOUNTER — Other Ambulatory Visit: Payer: Self-pay | Admitting: Family Medicine

## 2014-04-28 ENCOUNTER — Other Ambulatory Visit: Payer: Self-pay

## 2014-04-28 DIAGNOSIS — C259 Malignant neoplasm of pancreas, unspecified: Secondary | ICD-10-CM

## 2014-04-28 MED ORDER — ESOMEPRAZOLE MAGNESIUM 40 MG PO CPDR: DELAYED_RELEASE_CAPSULE | ORAL | Status: DC

## 2014-04-28 NOTE — Telephone Encounter (Signed)
[name] nurse at [facility] left v/m requesting [facility]ill [facility].Please advise.

## 2014-04-28 NOTE — Telephone Encounter (Signed)
Will refill electronically  

## 2014-05-05 ENCOUNTER — Ambulatory Visit (INDEPENDENT_AMBULATORY_CARE_PROVIDER_SITE_OTHER): Payer: Medicare Other | Admitting: Internal Medicine

## 2014-05-05 ENCOUNTER — Encounter: Payer: Self-pay | Admitting: Internal Medicine

## 2014-05-05 ENCOUNTER — Ambulatory Visit (INDEPENDENT_AMBULATORY_CARE_PROVIDER_SITE_OTHER)
Admission: RE | Admit: 2014-05-05 | Discharge: 2014-05-05 | Disposition: A | Discharge status: Home / Self Care | Payer: Medicare Other | Source: Ambulatory Visit | Attending: Internal Medicine | Admitting: Internal Medicine

## 2014-05-05 VITALS — BP 114/76 | HR 91 | Ht 60.0 in | Wt 135.2 lb

## 2014-05-05 DIAGNOSIS — J441 Chronic obstructive pulmonary disease with (acute) exacerbation: Secondary | ICD-10-CM

## 2014-05-05 DIAGNOSIS — J449 Chronic obstructive pulmonary disease, unspecified: Secondary | ICD-10-CM

## 2014-05-05 DIAGNOSIS — R911 Solitary pulmonary nodule: Secondary | ICD-10-CM

## 2014-05-05 DIAGNOSIS — J439 Emphysema, unspecified: Secondary | ICD-10-CM

## 2014-05-05 DIAGNOSIS — I509 Heart failure, unspecified: Secondary | ICD-10-CM

## 2014-05-05 DIAGNOSIS — J90 Pleural effusion, not elsewhere classified: Secondary | ICD-10-CM | POA: Diagnosis not present

## 2014-05-05 DIAGNOSIS — J438 Other emphysema: Secondary | ICD-10-CM | POA: Diagnosis not present

## 2014-05-05 DIAGNOSIS — K219 Gastro-esophageal reflux disease without esophagitis: Secondary | ICD-10-CM

## 2014-05-05 DIAGNOSIS — I5022 Chronic systolic (congestive) heart failure: Secondary | ICD-10-CM

## 2014-05-05 NOTE — Progress Notes (Signed)
06/08/12- 20 yoF former smoker  referred by Dr Benay Spice. Pt states having Increase SOB upon activity Productive cough in am . PCP Dr Loura Pardon    Son here She is being monitored for possible pancreatic tumor and has history of a biliary stent. For about 3 months she has been aware of dyspnea on exertion while walking, relieved by sitting. She had a "mild outpatient pneumonia" treated 2 weeks ago. She had been [facility] the [facility]. She was noting more shortness of breath with exertion after bad cold and some shingles. Weight had been going down. She has been drinking diet supplements to gain weight. She denies cough, wheeze, chest pain. She does hear a rattle but coughs out nothing. Legs feel weak. She denies fever, blood or swollen glands. She denies past history of asthma or pneumonia and she denies heart disease, reflux, choking with meals. She is widowed, living in a retirement center. 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 Father died with pneumonia, mother died with MI. CT chest was concerning for density, more than a "mild pneumonia". CT chest 05/07/12- reviewed with her IMPRESSION:  No evidence of significant pulmonary embolus. The extensive  consolidation of the right lower lung and right middle lung with  patchy areas of nodular infiltration in the left lower lung.  Changes may represent pneumonia. No definite evidence of a central  obstructing lesion although the distal right main stem bronchus is  somewhat narrowed and a cold mass may be obscured by the  consolidation. Recommend follow up after resolution of acute  symptoms. Large esophageal hiatal hernia with mild esophageal  dilatation. Esophagitis versus dysmotility. Biliary wall stent  and biliary gas demonstrated in the upper abdomen.  Original Report Authenticated By: Neale Burly, M.D.   CXR 06/03/12- IMPRESSION:  1. Multi focal  right lung airspace disease has not significantly  changed since 05/07/2012.  2. Interval increased patchy opacity at the left lung base.  Original Report Authenticated By: Randall An, M.D.   07/07/12- 56 yoF former smoker followed for pneumonia complicated by large hiatal hernia, pancreatic cancer. Dr Sherrill/ Onc> Hospice. Daughter and son here today. She is now followed by hospice because of pancreatic cancer with biliary stent. Notices dyspnea standing, and cough as she gets out of bed and starts moving. Mostly clear phlegm, occasional scant green. Wakes herself coughing. Avelox did not help. We reviewed the CT scan images again, nothing large hiatal hernia full of food and parenchymal consolidation consistent with pneumonia and especially consistent with aspiration pneumonia. Discussed possibility of organizing pneumonia.  07/27/12-  55 yoF former smoker followed for pneumonia complicated by large hiatal hernia, pancreatic cancer.  Dtr here. Sputum CX 06/10/12- Nl flora, neg for AFB. Dr Ardis Hughs has indicated steroid trial, aimed at organizing pneumonia, is ok from pancreas standpoint.  Very short of breath after shower. Home oxygen helps, provided by Hospice through? [employer]. No pain, fever, sweat. Scant clear mucus. Flutter device helps shortness of breath after use, but does not generate productive cough. She denies any choking or strangling with meals or during sleep. CXR I 07/27/12- images reviewed with them. IMPRESSION:  1. Worsening asymmetric airspace disease involving right lung  greater than left, suspicious for pneumonia.  2. Stable mild cardiomegaly.  3. Hiatal hernia again noted.  Original Report Authenticated By: Marlaine Hind, M.D.   08/13/12- 22 yoF former smoker followed for  pneumonia complicated by large hiatal hernia, pancreatic cancer.  Dtr here.  Pt reports legs no longer hurt, no more coughing in the morning, wearing O2 most of the time--not today. Has continued  maintenance prednisone 20 mg daily and recognizes breathing is better but it gives her some insomnia. Has elevated head of bed. Using oxygen at 2 L only as needed now. Not coughing at all. Wants flu vax CXR 08/13/12- IMPRESSION:  Improved right lower lobe and left lung base infiltrates.  Underlying COPD. No new abnormalities.   10/02/12- 72 yoF former smoker followed for pneumonia complicated by large hiatal hernia, pancreatic cancer.  Dtr here. Follow-Up  - has been feeling very anxious lately. Hospice nurse thinks that she needs something for anxiety. Prednisone seemed to reduce the amount of parenchymal density, consistent with impression that it was organizing pneumonia. She is followed with a hospice nurse. Reducing prednisone to 5 mg daily left her too short of breath, so  she went back to 10 mg daily. Less morning cough. Recent night sweats. Low back pain sometimes radiates to abdomen. CXR 10/02/12- we reviewed the images together IMPRESSION:  Improved airspace disease in the right mid and lower lung zones.  Persistent opacity is noted  Stable spiculated opacity at the left lung base. Underlying  malignancy cannot be excluded.  Original Report Authenticated By: Jamas Lav, M.D.   03/22/13- 50 yoF former smoker followed for pneumonia complicated by large hiatal hernia.  Dtr here. FOLLOWS FOR: breathing doing good as long as wearing O2; daughter states that pt is still breathy; hospice is pulling out as pt does not have pancreatic cancer; will need order for O2 through DME company since hospice is no longer going to help with this.  Has been using prednisone 10 mg alternating with 5 mg every other day. Makes her anxious. Mentions pains and right rib cage that seem to come and go.  05/17/13- 65 yoF former smoker followed for Hx pneumonia, COPD/E,  complicated by large hiatal hernia.  Dtr here. FOLLOWS FOR: continues to use O2 QHS. Breathing is doing okay O2 2-4L/ [facility], just for  sleep and as needed, but she doesn't think she needs much. Moving to assisted living. Per the right side of her back exercising, helped by physical therapy Prednisone now reduced to 5 mg daily. CXR 03/23/13-Stable spiculated opacity at the left lung base. Large hiatal hernia. IMPRESSION:  No acute abnormality is noted.  Original Report Authenticated By: Inez Catalina, M.D  08/19/13- 33 yoF former smoker followed for Hx pneumonia, COPD/E, lung nodule,  complicated by large hiatal hernia.  Dtr here FOLLOWS FOR: unsure if needs to go back to O2; uses her flutter device and feels that  helps some; had called about SOB and had prednisone increased. Cut off oxygen  Then tapered prednisone. Started getting more short of breath so she increased the prednisone back to 10 mg daily for the past month. Occasional dyspnea on exertion in assisted living but now feels comfortable. //needs f/u CXR for lung nodule//  11/25/13 Follow up COPD  Has to re- qualify for O2 -- did not qualify today here in the office.  No desats with walking.  Says overall breathing is the same . No flare in cough, wheezing or dyspnea. Gets winded easily.  On prednisone 5mg  alterating with 10mg  daily   01/06/14- 88 yoF former smoker followed for Hx pneumonia, COPD/E, lung nodule,  complicated by large hiatal hernia.  Dtr here FOLLOWS FOR:  Still  having sob with exertion-- discuss ONO results Diagnosed CHF in October. Hospitalized in October for easy dyspnea on exertion. Better now. Little cough. [place] 01/01/14- qualified for sleep oxygen  05/05/14- 88 yoF former smoker followed for Hx pneumonia, COPD/E, lung nodule,  complicated by large hiatal hernia.  Dtr here FOLLOWS FOR: Pt having SOB still-gets worse as the day goes on; using O2 QHS only. O2 2L sleep/ [facility] O2 helps sleep. Cardiology notes her large hiatal hernia after meals crowds her breathing. By end of day she is tired, chest feels full after supper-more short of breath then. No  chest pain or cough noted.   ROS-see HPI Constitutional:   No-  weight loss, night sweats, fevers, chills, fatigue, lassitude. HEENT:   No-  headaches, difficulty swallowing, tooth/dental problems, sore throat,       No-  sneezing, itching, ear ache, nasal congestion, post nasal drip,  CV:  No-   chest pain, orthopnea, PND, swelling in lower extremities, anasarca,dizziness, palpitations Resp: +shortness of breath with exertion or at rest.              No- productive cough,  No non-productive cough,  No- coughing up of blood.              No-   change in color of mucus.  No- wheezing.   Skin: No-   rash or lesions. GI:  No-   heartburn, indigestion, abdominal pain, nausea, vomiting,  GU:  MS:  No-   joint pain or swelling.  +back pain Neuro-     nothing unusual Psych:  No- change in mood or affect.  depression or +anxiety.  No memory loss.  OBJ- Physical Exam General- Alert, Oriented, Affect-appropriate, Distress- none acute, thin elderly woman Skin- rash-none, lesions- none, excoriation- none Lymphadenopathy- none Head- atraumatic            Eyes- Gross vision intact, PERRLA, conjunctivae and secretions clear            Ears- Hearing, canals-normal            Nose- Clear, no-Septal dev, mucus, polyps, erosion, perforation             Throat- Mallampati II , mucosa clear , drainage- none, tonsils- atrophic. Own teeth Neck- flexible , trachea midline, no stridor , thyroid nl, carotid no bruit Chest - symmetrical excursion , unlabored           Heart/CV- RRR/few extra beats , no murmur , no gallop  , no rub, nl s1 s2                           - JVD- none , edema- none, stasis changes- none, varices- none           Lung- +diminished, +few crackles, unlabored, rub- none, no cough, wheeze+trace           Chest wall-  Abd-  Br/ Gen/ Rectal- Not done, not indicated Extrem- cyanosis- none, clubbing, none, atrophy- none, strength- nl, using a walker Neuro- grossly intact to  observation

## 2014-05-05 NOTE — Patient Instructions (Addendum)
Order- CXR- Dx COPD, lung nodule  Ok to continue oxygen at night and if needed in the daytime

## 2014-05-16 ENCOUNTER — Encounter: Payer: Self-pay | Admitting: Family Medicine

## 2014-05-16 DIAGNOSIS — I5022 Chronic systolic (congestive) heart failure: Secondary | ICD-10-CM | POA: Diagnosis not present

## 2014-05-31 ENCOUNTER — Other Ambulatory Visit: Payer: Self-pay | Admitting: Oncology

## 2014-05-31 NOTE — Telephone Encounter (Signed)
Bridget Walker with [facility] requesting refill today for prednisone; pt will be out of med today. [facility] Dr Annamaria Boots had previously prescribed and recently saw pt; Leafy Ro will contact Dr Bertrum Sol office at [phone].

## 2014-06-07 ENCOUNTER — Telehealth: Payer: Self-pay | Admitting: Internal Medicine

## 2014-06-07 DIAGNOSIS — J439 Emphysema, unspecified: Secondary | ICD-10-CM

## 2014-06-07 NOTE — Telephone Encounter (Signed)
Called spoke with Bridget Walker. Made her aware of [facility] response. Order placed to [facility]. Nothing further needed

## 2014-06-07 NOTE — Telephone Encounter (Signed)
To get oxygen for daytime use, we have no choice- we have to be able to document that her oxygen falls low enough at rest or with exertion so we can get portable oxygen.  Try ordering DME to get O2 sat room air at rest and with exertion for dx COPD

## 2014-06-07 NOTE — Telephone Encounter (Signed)
Called spoke with [name]. She reports pt has hiatal hernia, CHF. Anytime pt eats lunch/dinner afterwards she feels like she can't breathe. She has to go up to her room and get on O2. Pt is upset bc she is not able to do any activities. Per [name] Dr. Annamaria Boots has tried to get pt qualified for daytime O2 but she does not drop low enough. Pt does have O2 at bedtime. She wants to know what else can be done to get her qualified for daytime O2 to get portable O2 for pt. Please advise Dr. Annamaria Boots thanks

## 2014-06-08 ENCOUNTER — Telehealth: Payer: Self-pay | Admitting: Internal Medicine

## 2014-06-08 NOTE — Telephone Encounter (Signed)
Spoke with Dawn-[facility] Order was placed to qualify patient for O2 at rest and with exertion. Pt currently wears O2 2L/min at bedtime Per Dawn, unable to qualify patients for O2 The only thing they are allowed to do are O2 titrations and the switch from continuous to pulsed O2 This will need to be done by our office or by a home health nurse if the patient has once caring for her.  LMOM x 1 for pt to return call.

## 2014-06-13 NOTE — Telephone Encounter (Signed)
Please call [phone] [facility]

## 2014-06-13 NOTE — Telephone Encounter (Signed)
Called, spoke with pt.  Pt requesting we call her daughter, [name], to discuss this. Called daughter's #, [phone], went directly to VM.  lmomtcb

## 2014-06-13 NOTE — Telephone Encounter (Signed)
Called and lmomtcb for Leticia Penna RN at the [facility] assisted  At [phone].  Another RN that took care of the pt is [employer].  Will wait for [name] to call back to see if they are able to check the pts oxygen level to see if we can get her on oxygen during the day.    pts daughter stated that after lunch the pt feels her oxygen levels are dropping and is worse after dinner.  [facility] is aware of this.

## 2014-06-14 NOTE — Telephone Encounter (Signed)
Called and spoke to Bridget Walker. She stated pt's sats during the day are consistently above 88% and are mostly in the upper 90's on RA. Leafy Ro stated that she thinks the pt would benefit from scheduled xanax instead of prn because the pt doesn't get the Xanax as much as she needs it because the pt's anxiety is missed upon assessment by the nurse aids and is not being reported to the nurse to give the xanax. Pt's sats are also above 88% when anxious and agitated.[name]'s number: [phone]. SN please advise if we can change the Xanax to scheduled.   Allergies  Allergen Reactions  . Lactose Intolerance (Gi)     Pt can drink skim milk  . Hydrocodone Rash    Patient unsure of reaction, but it may have been diarrhea.  . Penicillins Rash    On scalp      Current Outpatient Prescriptions on File Prior to Visit  Medication Sig Dispense Refill  . acetaminophen (TYLENOL) 500 MG tablet Take by mouth. 1-2 tab every 6 hrs prn      . ALPRAZolam (XANAX) 0.5 MG tablet Take 0.5 mg by mouth 3 (three) times daily as needed for anxiety.      Marland Kitchen aspirin EC 81 MG tablet Take 81 mg by mouth daily.      . Calcium Carbonate-Vitamin D (CALTRATE 600+D PO) Take 2 tablets by mouth daily.      . carvedilol (COREG) 3.125 MG tablet Take 1 tablet (3.125 mg total) by mouth 2 (two) times daily with a meal.  180 tablet  3  . esomeprazole (NEXIUM) 40 MG capsule TAKE 1 CAPSULE (40 MG TOTAL) BY MOUTH DAILY BEFORE BREAKFAST.  90 capsule  3  . furosemide (LASIX) 20 MG tablet Take 1 tablet (20 mg total) by mouth daily.  90 tablet  3  . lisinopril (PRINIVIL,ZESTRIL) 2.5 MG tablet Take 1 tablet (2.5 mg total) by mouth daily.  90 tablet  3  . megestrol (MEGACE) 40 MG/ML suspension Take 10 mLs (400 mg total) by mouth daily.  240 mL  0  . NON FORMULARY Oxygen 2 liters at bedtimes.      . Pancrelipase, Lip-Prot-Amyl, 3000-10000 UNITS CPEP Take by mouth 3 (three) times daily.      Vladimir Faster Glycol-Propyl Glycol (SYSTANE) 0.4-0.3 % GEL Apply  to eye. 1-2 drops in each eye BID prn      . polyethylene glycol powder (GLYCOLAX/MIRALAX) powder Take 17 g by mouth daily as needed.  527 g  3  . potassium chloride (K-DUR) 10 MEQ tablet Take 1 tablet (10 mEq total) by mouth daily.  90 tablet  3  . predniSONE (DELTASONE) 5 MG tablet take 2 tablets every other day alternating with 1 tablet every other day      . sodium chloride (OCEAN) 0.65 % SOLN nasal spray Place 1 spray into both nostrils as needed for congestion.      . traMADol (ULTRAM) 50 MG tablet Take 1 tablet (50 mg total) by mouth 2 (two) times daily.  15 tablet  0  . Vitamin D, Ergocalciferol, (DRISDOL) 50000 UNITS CAPS capsule TAKE 1 CAPSULE EVERY 7 DAYS  12 capsule  0   No current facility-administered medications on file prior to visit.

## 2014-06-14 NOTE — Telephone Encounter (Signed)
lmomtcb x 2  For [name] at [facility].

## 2014-06-14 NOTE — Telephone Encounter (Signed)
Nothing to add

## 2014-06-14 NOTE — Telephone Encounter (Signed)
Called and spoke to [place]. Informed her of recs per CY. Pt is currently already at 2lpm at night and prn during the day. CY please advise if any further recs. Thanks.  Allergies  Allergen Reactions  . Lactose Intolerance (Gi)     Pt can drink skim milk  . Hydrocodone Rash    Patient unsure of reaction, but it may have been diarrhea.  . Penicillins Rash    On scalp     Current Outpatient Prescriptions on File Prior to Visit  Medication Sig Dispense Refill  . acetaminophen (TYLENOL) 500 MG tablet Take by mouth. 1-2 tab every 6 hrs prn      . ALPRAZolam (XANAX) 0.5 MG tablet Take 0.5 mg by mouth 3 (three) times daily as needed for anxiety.      Marland Kitchen aspirin EC 81 MG tablet Take 81 mg by mouth daily.      . Calcium Carbonate-Vitamin D (CALTRATE 600+D PO) Take 2 tablets by mouth daily.      . carvedilol (COREG) 3.125 MG tablet Take 1 tablet (3.125 mg total) by mouth 2 (two) times daily with a meal.  180 tablet  3  . esomeprazole (NEXIUM) 40 MG capsule TAKE 1 CAPSULE (40 MG TOTAL) BY MOUTH DAILY BEFORE BREAKFAST.  90 capsule  3  . furosemide (LASIX) 20 MG tablet Take 1 tablet (20 mg total) by mouth daily.  90 tablet  3  . lisinopril (PRINIVIL,ZESTRIL) 2.5 MG tablet Take 1 tablet (2.5 mg total) by mouth daily.  90 tablet  3  . megestrol (MEGACE) 40 MG/ML suspension Take 10 mLs (400 mg total) by mouth daily.  240 mL  0  . NON FORMULARY Oxygen 2 liters at bedtimes.      . Pancrelipase, Lip-Prot-Amyl, 3000-10000 UNITS CPEP Take by mouth 3 (three) times daily.      Vladimir Faster Glycol-Propyl Glycol (SYSTANE) 0.4-0.3 % GEL Apply to eye. 1-2 drops in each eye BID prn      . polyethylene glycol powder (GLYCOLAX/MIRALAX) powder Take 17 g by mouth daily as needed.  527 g  3  . potassium chloride (K-DUR) 10 MEQ tablet Take 1 tablet (10 mEq total) by mouth daily.  90 tablet  3  . predniSONE (DELTASONE) 5 MG tablet take 2 tablets every other day alternating with 1 tablet every other day      . sodium chloride  (OCEAN) 0.65 % SOLN nasal spray Place 1 spray into both nostrils as needed for congestion.      . traMADol (ULTRAM) 50 MG tablet Take 1 tablet (50 mg total) by mouth 2 (two) times daily.  15 tablet  0  . Vitamin D, Ergocalciferol, (DRISDOL) 50000 UNITS CAPS capsule TAKE 1 CAPSULE EVERY 7 DAYS  12 capsule  0   No current facility-administered medications on file prior to visit.

## 2014-06-14 NOTE — Telephone Encounter (Signed)
1) Order- DME and nursing home- change O2 order to 2L for sleep and prn shortness of breath, to keep O2 sat 89-95%.  2) Response to call from ?[name] at nursing home. I cant tell if we or someone else ordered the Xanax originally. At her age, I am not comfortable with scheduled Xanax. The primary physician can change the order if they feel appropriate.

## 2014-06-15 ENCOUNTER — Encounter: Payer: Self-pay | Admitting: Family Medicine

## 2014-06-15 ENCOUNTER — Ambulatory Visit (INDEPENDENT_AMBULATORY_CARE_PROVIDER_SITE_OTHER): Payer: Medicare Other | Admitting: Family Medicine

## 2014-06-15 VITALS — BP 110/72 | HR 82 | Temp 98.3°F | Ht <= 58 in | Wt 133.5 lb

## 2014-06-15 DIAGNOSIS — M810 Age-related osteoporosis without current pathological fracture: Secondary | ICD-10-CM

## 2014-06-15 DIAGNOSIS — F0390 Unspecified dementia without behavioral disturbance: Secondary | ICD-10-CM | POA: Insufficient documentation

## 2014-06-15 DIAGNOSIS — Z0000 Encounter for general adult medical examination without abnormal findings: Secondary | ICD-10-CM

## 2014-06-15 DIAGNOSIS — E7800 Pure hypercholesterolemia, unspecified: Secondary | ICD-10-CM

## 2014-06-15 DIAGNOSIS — R413 Other amnesia: Secondary | ICD-10-CM | POA: Diagnosis not present

## 2014-06-15 DIAGNOSIS — F411 Generalized anxiety disorder: Secondary | ICD-10-CM

## 2014-06-15 DIAGNOSIS — K219 Gastro-esophageal reflux disease without esophagitis: Secondary | ICD-10-CM | POA: Diagnosis not present

## 2014-06-15 DIAGNOSIS — F419 Anxiety disorder, unspecified: Secondary | ICD-10-CM | POA: Insufficient documentation

## 2014-06-15 DIAGNOSIS — E559 Vitamin D deficiency, unspecified: Secondary | ICD-10-CM | POA: Diagnosis not present

## 2014-06-15 MED ORDER — SERTRALINE HCL 25 MG PO TABS: 25.0000 mg | ORAL_TABLET | Freq: Every day | ORAL | Status: DC

## 2014-06-15 NOTE — Progress Notes (Signed)
Pre visit review using our clinic review tool, if applicable. No additional management support is needed unless otherwise documented below in the visit note. 

## 2014-06-15 NOTE — Patient Instructions (Signed)
Labs today  Start zoloft 25 mg once daily at dinner time  If any side effects or problems let us know  Follow up in 1-2 months to address anxiety/ memory loss / osteoporosis

## 2014-06-15 NOTE — Progress Notes (Signed)
Subjective:    Patient ID: Bridget Walker, female    DOB: Jan 01, 1925, 80 y.o.   MRN: [id number]  HPI I have personally reviewed the Medicare Annual Wellness questionnaire and have noted 1. The patient's medical and social history 2. Their use of alcohol, tobacco or illicit drugs 3. Their current medications and supplements 4. The patient's functional ability including ADL's, fall risks, home safety risks and hearing or visual             impairment. 5. Diet and physical activities 6. Evidence for depression or mood disorders  The patients weight, height, BMI have been recorded in the chart and visual acuity is per eye clinic.  I have made referrals, counseling and provided education to the patient based review of the above and I have provided the pt with a written personalized care plan for preventive services.  Doing fairly well overall  She had an episode of "blood infection"- was hosp-never found a cause but treated - feeling better now   Lives at [facility] and likes it   Has a big hiatal hernia- Dr Annamaria Boots and Dr Rockey Situ have watched this  Qualifies for 02 during the night but not during the day  She has some trouble breathing at night - and having some anxiety for that  Has xanax px (this was left over from when they thought she had cancer)- does not think she needs something that strong  The staff mentioned zoloft to try   See scanned forms.  Routine anticipatory guidance given to patient.  See health maintenance. Colon cancer screening 11/09 - no recall mentioned  Breast cancer screening 3/13 - decided not to do them at her age  Self breast exam -no lumps but does not really check often  Flu vaccine 9/14 Tetanus vaccine  Td 11/06 Pneumovax 11/03 she is open to prevnar today Zoster vaccine 12/07  Advance directive-has living will/ POA and adv directed (is written up) - also a DNR order  Cognitive function addressed- see scanned forms- and if abnormal then additional  documentation follows. -pt does not think she has more memory loss than she should at her age -does not know who the president is , does not know what the month is , does know the day of the week  Cannot spell world backwards  Some rambling speech   PMH and SH reviewed  Meds, vitals, and allergies reviewed.   ROS: See HPI.  Otherwise negative.    Due for labs for her chronic problems   Hyperlipidemia - due for a check  Pretty much eats what she wants to   Osteoporosis  Vit D def -on weekly vit D  On low dose prednisone  She was put on fosamax once weekly - it does not look like she is getting it at [facility] ? If one of her specialists took her off of it for hiatal hernia? She does not know No falls or fx   bp is good  BP Readings from Last 3 Encounters:  06/15/14 110/72  05/05/14 114/76  03/22/14 122/76    Patient Active Problem List   Diagnosis Date Noted  . Encounter for Medicare annual wellness exam 06/15/2014  . Memory loss 06/15/2014  . Anxiety disorder 06/15/2014  . Orthostatic hypotension 03/10/2014  . Blood bacterial culture positive 03/02/2014  . Nocturnal hypoxemia 01/18/2014  . Chronic systolic CHF (congestive heart failure) 10/20/2013  . Compression fracture of spine 06/10/2013  . Back pain of thoracolumbar  region 06/09/2013  . Mobility impaired 04/16/2013  . Frequent PVCs 03/28/2013  . COPD with emphysema 03/28/2013  . Lung nodule, L lower lobe 10/15/2012  . Lobar pneumonia due to unspecified organism 06/11/2012  . Abnormal CT scan, gastrointestinal tract 03/24/2012  . Nonspecific (abnormal) findings on radiological and other examination of biliary tract 03/24/2012  . Weight loss 03/18/2012  . Fatigue 03/18/2012  . Jaundice 03/18/2012  . Loss of appetite 03/18/2012  . Anemia 11/29/2011  . TINNITUS 08/09/2010  . OVERACTIVE BLADDER 03/14/2010  . GERD 11/06/2009  . UNSPECIFIED VITAMIN D DEFICIENCY 10/31/2008  . DIVERTICULOSIS OF COLON 12/25/2007  .  RESTLESS LEG SYNDROME 10/26/2007  . COLONIC POLYPS, HX OF 10/26/2007  . HYPERCHOLESTEROLEMIA 09/23/2007  . OSTEOARTHRITIS 09/23/2007  . OSTEOPOROSIS 09/23/2007   Past Medical History  Diagnosis Date  . Osteoporosis   . Hyperlipidemia   . RLS (restless legs syndrome)   . Vitamin D deficiency   . GERD (gastroesophageal reflux disease)   . Cerumen impaction     recurrent  . Unexplained weight loss   . Cough   . Constipation   . Diarrhea   . Shingles   . Overactive bladder   . Cancer     pancreatic  . Arthritis     OA, in hands  . CHF (congestive heart failure)   . Pancreatic insufficiency   . Hiatal hernia    Past Surgical History  Procedure Laterality Date  . Cataract extraction      patient unsure of date procedure was done.  . Cataract extraction  11/08    2nd cataract  . Ercp  03/24/2012    Procedure: ENDOSCOPIC RETROGRADE CHOLANGIOPANCREATOGRAPHY (ERCP);  Surgeon: Milus Banister, MD;  Location: [facility];  Service: Endoscopy;  Laterality: N/A;  . Eus  04/02/2012    Procedure: ESOPHAGEAL ENDOSCOPIC ULTRASOUND (EUS) RADIAL;  Surgeon: Milus Banister, MD;  Location: [facility];  Service: Endoscopy;  Laterality: N/A;  EUS with FNA  . Fine needle aspiration  04/02/2012    Procedure: FINE NEEDLE ASPIRATION (FNA) LINEAR;  Surgeon: Milus Banister, MD;  Location: [facility];  Service: Endoscopy;  Laterality: N/A;  . Endoscopic retrograde cholangiopancreatography (ercp) with propofol N/A 01/28/2013    Procedure: ENDOSCOPIC RETROGRADE CHOLANGIOPANCREATOGRAPHY (ERCP) WITH PROPOFOL;  Surgeon: Milus Banister, MD;  Location: [facility];  Service: Endoscopy;  Laterality: N/A;  . Cardiac catheterization  10/14    [facility];no stent   History  Substance Use Topics  . Smoking status: Former Smoker -- 0.25 packs/day for 20 years    Types: Cigarettes    Quit date: 03/23/1992  . Smokeless tobacco: Never Used     Comment: PT SMOKED 2 CIGARETTES A DAY   . Alcohol Use: No   Family  History  Problem Relation Age of Onset  . Diabetes Sister   . Hypertension Sister   . Heart disease Brother   . Fibromyalgia Daughter   . Alcohol abuse Son   . Heart disease Brother   . Diabetes Brother   . Heart disease Mother   . Pneumonia Father   . Cancer Other     niece had breast and bone cancer  . Heart failure Father    Allergies  Allergen Reactions  . Lactose Intolerance (Gi)     Pt can drink skim milk  . Hydrocodone Rash    Patient unsure of reaction, but it may have been diarrhea.  . Penicillins Rash    On scalp   Current Outpatient  Prescriptions on File Prior to Visit  Medication Sig Dispense Refill  . acetaminophen (TYLENOL) 500 MG tablet Take by mouth. 1-2 tab every 6 hrs prn      . ALPRAZolam (XANAX) 0.5 MG tablet Take 0.5 mg by mouth 3 (three) times daily as needed for anxiety.      Marland Kitchen aspirin EC 81 MG tablet Take 81 mg by mouth daily.      . Calcium Carbonate-Vitamin D (CALTRATE 600+D PO) Take 2 tablets by mouth daily.      . carvedilol (COREG) 3.125 MG tablet Take 1 tablet (3.125 mg total) by mouth 2 (two) times daily with a meal.  180 tablet  3  . esomeprazole (NEXIUM) 40 MG capsule TAKE 1 CAPSULE (40 MG TOTAL) BY MOUTH DAILY BEFORE BREAKFAST.  90 capsule  3  . furosemide (LASIX) 20 MG tablet Take 1 tablet (20 mg total) by mouth daily.  90 tablet  3  . lisinopril (PRINIVIL,ZESTRIL) 2.5 MG tablet Take 1 tablet (2.5 mg total) by mouth daily.  90 tablet  3  . megestrol (MEGACE) 40 MG/ML suspension Take 10 mLs (400 mg total) by mouth daily.  240 mL  0  . NON FORMULARY Oxygen 2 liters at bedtimes.      . Pancrelipase, Lip-Prot-Amyl, 3000-10000 UNITS CPEP Take by mouth 3 (three) times daily.      Vladimir Faster Glycol-Propyl Glycol (SYSTANE) 0.4-0.3 % GEL Apply to eye. 1-2 drops in each eye BID prn      . polyethylene glycol powder (GLYCOLAX/MIRALAX) powder Take 17 g by mouth daily as needed.  527 g  3  . potassium chloride (K-DUR) 10 MEQ tablet Take 1 tablet (10 mEq  total) by mouth daily.  90 tablet  3  . predniSONE (DELTASONE) 5 MG tablet take 2 tablets every other day alternating with 1 tablet every other day      . sodium chloride (OCEAN) 0.65 % SOLN nasal spray Place 1 spray into both nostrils as needed for congestion.      . traMADol (ULTRAM) 50 MG tablet Take 1 tablet (50 mg total) by mouth 2 (two) times daily.  15 tablet  0  . Vitamin D, Ergocalciferol, (DRISDOL) 50000 UNITS CAPS capsule TAKE 1 CAPSULE EVERY 7 DAYS  12 capsule  0   No current facility-administered medications on file prior to visit.     Review of Systems Review of Systems  Constitutional: Negative for fever, appetite change, fatigue and unexpected weight change.  Eyes: Negative for pain and visual disturbance.  Respiratory: Negative for cough and pos for sob from [facility] if she eats too much   Cardiovascular: Negative for cp or palpitations    Gastrointestinal: Negative for nausea, diarrhea and constipation. denies acid reflux symptoms today Genitourinary: Negative for urgency and frequency.  Skin: Negative for pallor or rash   Neurological: Negative for weakness, light-headedness, numbness and headaches.  Hematological: Negative for adenopathy. Does not bruise/bleed easily.  Psychiatric/Behavioral: Negative for dysphoric mood. The patient is nervous/anxious, pos for memory loss and easy confusion         Objective:   Physical Exam  Constitutional: She appears well-developed and well-nourished. No distress.  HENT:  Head: Normocephalic and atraumatic.  Right Ear: External ear normal.  Left Ear: External ear normal.  Mouth/Throat: Oropharynx is clear and moist.  Eyes: Conjunctivae and EOM are normal. Pupils are equal, round, and reactive to light. No scleral icterus.  Neck: Normal range of motion. Neck supple. No JVD present. Carotid  bruit is not present. No thyromegaly present.  Cardiovascular: Normal rate, regular rhythm, normal heart sounds and intact distal pulses.  Exam  reveals no gallop.   Pulmonary/Chest: Effort normal and breath sounds normal. No respiratory distress. She has no wheezes. She exhibits no tenderness.  Abdominal: Soft. Bowel sounds are normal. She exhibits no distension, no abdominal bruit and no mass. There is no tenderness.  Musculoskeletal: Normal range of motion. She exhibits no edema and no tenderness.  Kyphosis noted   Lymphadenopathy:    She has no cervical adenopathy.  Neurological: She is alert. She has normal reflexes. No cranial nerve deficit. She exhibits normal muscle tone. Coordination normal.  Skin: Skin is warm and dry. No rash noted. No erythema. No pallor.  Psychiatric:  Mildly anxious  Very talkative but confuses easily Tangential with poor short term memory          Assessment & Plan:   Problem List Items Addressed This Visit     Digestive   GERD     Pt has large HH - she denies gerd symptoms except for sob when she lies down after eating  Recommend waiting to lie down  Will tx the anxiety that goes with this    Relevant Orders      CBC with Differential (Completed)     Musculoskeletal and Integument   OSTEOPOROSIS     Rev dexa in the fall and px fosamax ? Why she is not on it - no side eff but unsure if specialist stopped it due to [facility] Rev need for ca and D No falls or fx Disc need for calcium/ vitamin D/ wt bearing exercise and bone density test every 2 y to monitor Disc safety/ fracture risk in detail      Relevant Orders      Vit D  25 hydroxy (rtn osteoporosis monitoring) (Completed)     Other   UNSPECIFIED VITAMIN D DEFICIENCY     D level today  Disc importance of this to bone and overall health     Relevant Orders      Vit D  25 hydroxy (rtn osteoporosis monitoring) (Completed)   HYPERCHOLESTEROLEMIA     Lab today Not watching diet carefully at her age-is not very concerned     Relevant Orders      Lipid panel (Completed)   Encounter for Medicare annual wellness exam - Primary      Reviewed health habits including diet and exercise and skin cancer prevention Reviewed appropriate screening tests for age  Also reviewed health mt list, fam hx and immunization status , as well as social and family history   See HPI Pos screen for dementia-will follow this  F/u after tx of anxiety Suspect pt failed hearing test due to confusion re: when to indicate she heard a tone -she states she heard everything     Memory loss     Noted probs with cognition and short term memory today Pt not surprised due to age and not concerned  Has a good safety situation at [facility] F/u planned after start of SSRI for anxiety -to see if this is helpful Lab today    Relevant Orders      Comprehensive metabolic panel (Completed)      CBC with Differential (Completed)      TSH (Completed)      Vitamin B12 (Completed)   Anxiety disorder     Trial of low dose zoloft- with disc of poss side eff  incl worse anx or dep Pt and family aware Staff will watch her at [facility]  F/u planned Xanax inst to give as needed 1/2 dose so not as sedating as well

## 2014-06-16 ENCOUNTER — Telehealth: Payer: Self-pay

## 2014-06-16 LAB — COMPREHENSIVE METABOLIC PANEL
ALBUMIN: 4.2 g/dL (ref 3.5–5.2)
ALK PHOS: 38 U/L — AB (ref 39–117)
ALT: 18 U/L (ref 0–35)
AST: 41 U/L — ABNORMAL HIGH (ref 0–37)
BUN: 33 mg/dL — ABNORMAL HIGH (ref 6–23)
CO2: 24 meq/L (ref 19–32)
Calcium: 10 mg/dL (ref 8.4–10.5)
Chloride: 103 mEq/L (ref 96–112)
Creatinine, Ser: 1.1 mg/dL (ref 0.4–1.2)
GFR: 48.92 mL/min — ABNORMAL LOW (ref >60.00)
Glucose, Bld: 104 mg/dL — ABNORMAL HIGH (ref 70–99)
POTASSIUM: 4.7 meq/L (ref 3.5–5.1)
SODIUM: 139 meq/L (ref 135–145)
TOTAL PROTEIN: 8.1 g/dL (ref 6.0–8.3)
Total Bilirubin: 0.6 mg/dL (ref 0.2–1.2)

## 2014-06-16 LAB — CBC WITH DIFFERENTIAL/PLATELET
BASOS ABS: 0 10*3/uL (ref 0.0–0.1)
Basophils Relative: 0.2 % (ref 0.0–3.0)
EOS PCT: 0.4 % (ref 0.0–5.0)
Eosinophils Absolute: 0 10*3/uL (ref 0.0–0.7)
HCT: 38.5 % (ref 36.0–46.0)
Hemoglobin: 12.8 g/dL (ref 12.0–15.0)
Lymphocytes Relative: 21.8 % (ref 12.0–46.0)
Lymphs Abs: 2.2 10*3/uL (ref 0.7–4.0)
MCHC: 33.3 g/dL (ref 30.0–36.0)
MCV: 93.1 fl (ref 78.0–100.0)
MONO ABS: 0.7 10*3/uL (ref 0.1–1.0)
Monocytes Relative: 6.5 % (ref 3.0–12.0)
NEUTROS PCT: 71.1 % (ref 43.0–77.0)
Neutro Abs: 7.3 10*3/uL (ref 1.4–7.7)
PLATELETS: 296 10*3/uL (ref 150.0–400.0)
RBC: 4.14 Mil/uL (ref 3.87–5.11)
RDW: 16.5 % — ABNORMAL HIGH (ref 11.5–15.5)
WBC: 10.2 10*3/uL (ref 4.0–10.5)

## 2014-06-16 LAB — LIPID PANEL
CHOLESTEROL: 186 mg/dL (ref 0–200)
HDL: 46.5 mg/dL (ref >39.00)
LDL CALC: 123 mg/dL — AB (ref 0–99)
NonHDL: 139.5
TRIGLYCERIDES: 83 mg/dL (ref 0.0–149.0)
Total CHOL/HDL Ratio: 4
VLDL: 16.6 mg/dL (ref 0.0–40.0)

## 2014-06-16 LAB — VITAMIN B12: Vitamin B-12: 678 pg/mL (ref 211–911)

## 2014-06-16 LAB — VITAMIN D 25 HYDROXY (VIT D DEFICIENCY, FRACTURES): VITD: 78.63 ng/mL

## 2014-06-16 LAB — TSH: TSH: 3.7 u[IU]/mL (ref 0.35–4.50)

## 2014-06-16 MED ORDER — ALPRAZOLAM 0.5 MG PO TABS: ORAL_TABLET | ORAL | Status: DC

## 2014-06-16 NOTE — Assessment & Plan Note (Signed)
Trial of low dose zoloft- with disc of poss side eff incl worse anx or dep Pt and family aware Staff will watch her at [facility]  F/u planned Xanax inst to give as needed 1/2 dose so not as sedating as well

## 2014-06-16 NOTE — Assessment & Plan Note (Addendum)
Reviewed health habits including diet and exercise and skin cancer prevention Reviewed appropriate screening tests for age  Also reviewed health mt list, fam hx and immunization status , as well as social and family history   See HPI Pos screen for dementia-will follow this  F/u after tx of anxiety Suspect pt failed hearing test due to confusion re: when to indicate she heard a tone -she states she heard everything

## 2014-06-16 NOTE — Assessment & Plan Note (Signed)
Pt has large HH - she denies gerd symptoms except for sob when she lies down after eating  Recommend waiting to lie down  Will tx the anxiety that goes with this

## 2014-06-16 NOTE — Assessment & Plan Note (Signed)
D level today  Disc importance of this to bone and overall health

## 2014-06-16 NOTE — Telephone Encounter (Signed)
Called and lmom for mandy to make her aware of CY recs.  Advised to call back if anything further is needed.

## 2014-06-16 NOTE — Telephone Encounter (Signed)
Rx called in as prescribed and pt's daughter [name] notified

## 2014-06-16 NOTE — Telephone Encounter (Signed)
Px written for call in   

## 2014-06-16 NOTE — Assessment & Plan Note (Signed)
Noted probs with cognition and short term memory today Pt not surprised due to age and not concerned  Has a good safety situation at [facility] F/u planned after start of SSRI for anxiety -to see if this is helpful Lab today

## 2014-06-16 NOTE — Assessment & Plan Note (Signed)
Lab today Not watching diet carefully at her age-is not very concerned

## 2014-06-16 NOTE — Telephone Encounter (Signed)
Bridget Walker pts daughter said when she got to [place] was advised that pt was out of Alprazolam; Bridget Walker request alprazolam called to [place] so that it can be picked up today and taken to [facility]. Bridget Walker request cb when refilled.

## 2014-06-16 NOTE — Assessment & Plan Note (Signed)
Rev dexa in the fall and px fosamax ? Why she is not on it - no side eff but unsure if specialist stopped it due to [facility] Rev need for ca and D No falls or fx Disc need for calcium/ vitamin D/ wt bearing exercise and bone density test every 2 y to monitor Disc safety/ fracture risk in detail

## 2014-06-16 NOTE — Telephone Encounter (Signed)
Bridget Walker pts daughter and POA request status of zoloft rx to [place]; spoke with [facility] and rx would be ready for pick up approx 1 hr. Bridget Walker notified and voiced understanding.

## 2014-06-21 ENCOUNTER — Telehealth: Payer: Self-pay | Admitting: Internal Medicine

## 2014-06-21 MED ORDER — PREDNISONE 10 MG PO TABS
10.0000 mg | ORAL_TABLET | Freq: Every day | ORAL | Status: DC
Start: 2014-06-21 — End: 2014-11-24

## 2014-06-21 NOTE — Telephone Encounter (Signed)
I called spoke with daughter. She needs an updated RX for pt pred 10 [facility]. I have done so. Nothing further needed

## 2014-06-22 NOTE — Assessment & Plan Note (Signed)
Watching for active lesion with little we would do at age 80, perhaps. Mostly scarring.  Plan CXR

## 2014-06-22 NOTE — Assessment & Plan Note (Signed)
Managed by cardiology 

## 2014-06-22 NOTE — Assessment & Plan Note (Signed)
Pulmonary status seems stable. Variable symptoms may reflect status of hiatal hernia and CHF

## 2014-06-22 NOTE — Assessment & Plan Note (Signed)
Likely chronic intermittent reflux, potential aspiration. This also likely to contribute to sense of chest "fullness" after meals.

## 2014-06-23 ENCOUNTER — Telehealth: Payer: Self-pay

## 2014-06-23 NOTE — Telephone Encounter (Signed)
Please thank her for her input- it is really up to the patient - please ask [name] talk to her about it and let me know

## 2014-06-23 NOTE — Telephone Encounter (Signed)
[name] pts daughter left v/m; pt was seen on 06/15/14 and pt was not sure why osteoporosis med was stopped. [name] said  Nurse at [facility] checked pts record and [name] stopped osteoporosis med at [facility] due to pt having a lot of things going on; pt was having difficult time adjusting to many things, PT and transitioning back to assisted living. [name] said pt did not like taking med and having to stay up rather than going back to bed; [name] understands prednisone can cause decreased bone density and if [name] thinks should reinstate osteo med that would be OK with [name]. [name] said can send any orders to Leticia Penna at [facility] assisted living. [name] request cb.

## 2014-06-24 NOTE — Telephone Encounter (Signed)
I went to send it to the pharmacy - but then upon further review of her labs -it is not indicated at her age with her kidney function numbers (I think her renal function has declined with age ) - but she is not in kidney failure  Let's watch this over time - and make sure she is drinking enough water (if she drank enough water then the calculation would change) So work on that and we may consider it later  Thanks for the effort so far  Make sure she takes her calcium and vit D

## 2014-06-24 NOTE — Telephone Encounter (Signed)
Pt's daughter notified of Dr. Marliss Coots comments/recommendations and verbalized understanding.

## 2014-06-24 NOTE — Telephone Encounter (Signed)
Spoke with daughter and she has already discussed restarting medication with pt and she was okay with it. Daughter said the only reason it was stopped was because pt had so much going on at one time, between her being moved and having an infection and she was more confused then normal, she was having a hard time adjusting to everything, so all of her non essential medications were stopped temporarily but this one was never restarted . But since pt is better daughter did want her to restart it especially due to her fractures in her back and her being on prednisone.

## 2014-06-30 ENCOUNTER — Other Ambulatory Visit: Payer: Self-pay | Admitting: Family Medicine

## 2014-07-01 ENCOUNTER — Other Ambulatory Visit: Payer: Self-pay

## 2014-07-01 MED ORDER — TRAMADOL HCL 50 MG PO TABS: 50.0000 mg | ORAL_TABLET | Freq: Two times a day (BID) | ORAL | Status: DC

## 2014-07-01 NOTE — Telephone Encounter (Signed)
Done  Please call in  Printed the one for mail in

## 2014-07-01 NOTE — Telephone Encounter (Signed)
[facility] nurse at [facility] left v/m requesting [facility] and also [facility] amt since pt will be out of med this week.Please advise.

## 2014-07-01 NOTE — Telephone Encounter (Signed)
Rx faxed to [facility].  [facility].

## 2014-07-08 ENCOUNTER — Other Ambulatory Visit: Payer: Self-pay | Admitting: Family Medicine

## 2014-07-08 NOTE — Telephone Encounter (Signed)
Spoke with Reynold Bowen pt's daughter and they are taking pt out of town for a week and still have not received the mail order Rx of  Tramadol (pt's nursing home hasn't received Rx from mail order pharmacy neither). Daughter would like 1 more (small amount) of the tramadol sent to the local. Pt takes Rx BID and they are going to be gone for 7 days. Daughter request call back at [phone] when Dr. Glori Bickers response to this message

## 2014-07-08 NOTE — Telephone Encounter (Signed)
Px written for call in   They may want to call the mail order co to see what the delay is

## 2014-07-08 NOTE — Telephone Encounter (Signed)
Rx called in as prescribed, and pt's daughter Reynold Bowen notified. She said she check with mail order pharmacy and they have shipped Rx today but it may take a few days

## 2014-07-20 ENCOUNTER — Telehealth: Payer: Self-pay | Admitting: *Deleted

## 2014-07-20 DIAGNOSIS — M25549 Pain in joints of unspecified hand: Secondary | ICD-10-CM | POA: Diagnosis not present

## 2014-07-20 DIAGNOSIS — M25539 Pain in unspecified wrist: Secondary | ICD-10-CM | POA: Diagnosis not present

## 2014-07-20 NOTE — Telephone Encounter (Signed)
Please ok that verbally Thanks

## 2014-07-20 NOTE — Telephone Encounter (Signed)
Orders sent to [facility]

## 2014-07-20 NOTE — Telephone Encounter (Signed)
Pt fell this morning, pt said pain is worse then this morning, pt has a skin tear on right arm with bruising and swelling. hand pain, bruising and, pt has no other injury, neuro checks normal, Katie, RN is requesting order for moble xray views of right hand wrist and elbow, with dx pain after fall

## 2014-07-22 ENCOUNTER — Telehealth: Payer: Self-pay | Admitting: Family Medicine

## 2014-07-22 ENCOUNTER — Encounter: Payer: Self-pay | Admitting: Family Medicine

## 2014-07-22 NOTE — Telephone Encounter (Signed)
Bridget Walker called from [facility].  Pt fell and they had to have x-rays done.  Pt now complaining of pain in her groin area.  She wants to know if she needs a pelvic x-ray or not.  She is on Tramadol 50 mg regular 2 times per day but they are having a hard time controlling her pain.  Bridget Walker is asking if they should also add in extra strength tylenol.  Please call Bridget Walker. [phone]

## 2014-07-22 NOTE — Telephone Encounter (Signed)
Bridget Walker advised.

## 2014-07-22 NOTE — Telephone Encounter (Signed)
Please do the pelvic xray  She can have and ES tylenol twice daily (no more than that due to liver) Watch for falls  If worse over the weekend - transport her to ER for eval please

## 2014-07-25 ENCOUNTER — Telehealth: Payer: Self-pay

## 2014-07-25 ENCOUNTER — Telehealth: Payer: Self-pay | Admitting: Family Medicine

## 2014-07-25 DIAGNOSIS — M25559 Pain in unspecified hip: Secondary | ICD-10-CM | POA: Diagnosis not present

## 2014-07-25 NOTE — Telephone Encounter (Signed)
Forms in your inbox

## 2014-07-25 NOTE — Telephone Encounter (Signed)
Bridget Walker @ [facility] Dropped off [place] form On [place] desk

## 2014-07-25 NOTE — Telephone Encounter (Signed)
Done and in IN box 

## 2014-07-25 NOTE — Telephone Encounter (Signed)
Pelvic xray wasn't done on Friday so verbal order to get pelvic xray and verbal orders given to Malachy Mood, RN with [facility]

## 2014-07-25 NOTE — Telephone Encounter (Signed)
Cheryl nurse with [facility] left v/m; pt came to [facility] on 07/22/14 after a fall; Malachy Mood request order for pelvic xray and PT/OT eval. Malachy Mood request cb.

## 2014-07-25 NOTE — Telephone Encounter (Signed)
I ok'd the pelvic xray on Friday Please verbally ok the therapy as well Thanks

## 2014-07-26 ENCOUNTER — Telehealth: Payer: Self-pay

## 2014-07-26 DIAGNOSIS — R5383 Other fatigue: Secondary | ICD-10-CM

## 2014-07-26 DIAGNOSIS — F411 Generalized anxiety disorder: Secondary | ICD-10-CM

## 2014-07-26 DIAGNOSIS — F068 Other specified mental disorders due to known physiological condition: Secondary | ICD-10-CM

## 2014-07-26 DIAGNOSIS — K8689 Other specified diseases of pancreas: Secondary | ICD-10-CM | POA: Diagnosis not present

## 2014-07-26 DIAGNOSIS — J449 Chronic obstructive pulmonary disease, unspecified: Secondary | ICD-10-CM | POA: Diagnosis not present

## 2014-07-26 DIAGNOSIS — R5381 Other malaise: Secondary | ICD-10-CM | POA: Diagnosis not present

## 2014-07-26 DIAGNOSIS — I5022 Chronic systolic (congestive) heart failure: Secondary | ICD-10-CM

## 2014-07-26 NOTE — Telephone Encounter (Signed)
Please remove xanax from med list and make her residence aware  I will write a px to d/c xanax and put in IN box

## 2014-07-26 NOTE — Telephone Encounter (Signed)
Order faxed and pt notified, med removed off med list

## 2014-07-26 NOTE — Telephone Encounter (Signed)
Bridget Walker pts daughter left v/m requesting xanax be removed from pts med list; also request order sent to [facility] where pt is staying temporarily since a recent fall, the zoloft is working well; Bridget Walker does not want the health care facility to give pt xanax because xanax can cause pt to be extremely sleepy and incoherent. Bridget Walker request cb.

## 2014-07-26 NOTE — Telephone Encounter (Signed)
Seth Bake notified forms ready for pick-up

## 2014-07-27 DIAGNOSIS — M6281 Muscle weakness (generalized): Secondary | ICD-10-CM | POA: Diagnosis not present

## 2014-07-28 DIAGNOSIS — M6281 Muscle weakness (generalized): Secondary | ICD-10-CM | POA: Diagnosis not present

## 2014-07-29 DIAGNOSIS — M6281 Muscle weakness (generalized): Secondary | ICD-10-CM | POA: Diagnosis not present

## 2014-07-29 DIAGNOSIS — F29 Unspecified psychosis not due to a substance or known physiological condition: Secondary | ICD-10-CM | POA: Diagnosis not present

## 2014-07-29 DIAGNOSIS — R4182 Altered mental status, unspecified: Secondary | ICD-10-CM | POA: Diagnosis not present

## 2014-08-01 DIAGNOSIS — M6281 Muscle weakness (generalized): Secondary | ICD-10-CM | POA: Diagnosis not present

## 2014-08-01 DIAGNOSIS — R4182 Altered mental status, unspecified: Secondary | ICD-10-CM | POA: Diagnosis not present

## 2014-08-02 ENCOUNTER — Other Ambulatory Visit: Payer: Self-pay | Admitting: Internal Medicine

## 2014-08-02 DIAGNOSIS — M6281 Muscle weakness (generalized): Secondary | ICD-10-CM | POA: Diagnosis not present

## 2014-08-03 DIAGNOSIS — M6281 Muscle weakness (generalized): Secondary | ICD-10-CM | POA: Diagnosis not present

## 2014-08-04 ENCOUNTER — Telehealth: Payer: Self-pay | Admitting: Family Medicine

## 2014-08-04 DIAGNOSIS — M6281 Muscle weakness (generalized): Secondary | ICD-10-CM | POA: Diagnosis not present

## 2014-08-04 NOTE — Telephone Encounter (Signed)
Confirm -not at [facility] anymore?  Is another doctor covering her care ?  Please send for the ua and urine cx result if I have not already rec them, thanks

## 2014-08-04 NOTE — Telephone Encounter (Signed)
Pt's daughter is calling with some concerns, would like a call back. Pt is in [facility] because of a recent fall. Pt had urine culture which came back positive but was not treated with antibotics. She seems confused last week, but not as much this week. Pt's daughter calling and wanting a second opinion. Thank you

## 2014-08-05 DIAGNOSIS — M6281 Muscle weakness (generalized): Secondary | ICD-10-CM | POA: Diagnosis not present

## 2014-08-05 NOTE — Telephone Encounter (Signed)
I tried to explain asymptomatic bacteriuria to her but she just didn't understand. Patient has actually really improved over this week but daughter just won't let up.  I did approve 3 day Rx just in case cystitis though I am not convinced she has infection

## 2014-08-05 NOTE — Telephone Encounter (Signed)
Thanks

## 2014-08-05 NOTE — Telephone Encounter (Signed)
Pt's daughter notified of Dr. Alla German comments and that an abx was ordered

## 2014-08-05 NOTE — Telephone Encounter (Signed)
UA and Urine cx received and placed in your inbox

## 2014-08-05 NOTE — Telephone Encounter (Signed)
Per Dr. Glori Bickers she would like Dr. Alla German input on this, message sent to Dr. Silvio Pate

## 2014-08-05 NOTE — Telephone Encounter (Signed)
Spoke with [facility] and she is still there she is just in another dpt. due to her fall, spoke with the nurse there and she did advise me that Dr. Silvio Pate ordered a UA and Urine cx. The nurse said UA was neg and but urine cx did grow out a bacteria and per Dr. Silvio Pate they are not treating her for UTI, I did request them to fax over the results so Dr. Glori Bickers could review

## 2014-08-07 DIAGNOSIS — M6281 Muscle weakness (generalized): Secondary | ICD-10-CM | POA: Diagnosis not present

## 2014-08-08 DIAGNOSIS — M6281 Muscle weakness (generalized): Secondary | ICD-10-CM | POA: Diagnosis not present

## 2014-08-08 DIAGNOSIS — IMO0002 Reserved for concepts with insufficient information to code with codable children: Secondary | ICD-10-CM | POA: Diagnosis not present

## 2014-08-08 DIAGNOSIS — N183 Chronic kidney disease, stage 3 unspecified: Secondary | ICD-10-CM | POA: Diagnosis not present

## 2014-08-09 DIAGNOSIS — Z9181 History of falling: Secondary | ICD-10-CM | POA: Diagnosis not present

## 2014-08-09 DIAGNOSIS — M6281 Muscle weakness (generalized): Secondary | ICD-10-CM | POA: Diagnosis not present

## 2014-08-10 ENCOUNTER — Encounter: Payer: Self-pay | Admitting: Gastroenterology

## 2014-08-10 DIAGNOSIS — Z9181 History of falling: Secondary | ICD-10-CM | POA: Diagnosis not present

## 2014-08-10 DIAGNOSIS — M6281 Muscle weakness (generalized): Secondary | ICD-10-CM | POA: Diagnosis not present

## 2014-08-11 DIAGNOSIS — M6281 Muscle weakness (generalized): Secondary | ICD-10-CM | POA: Diagnosis not present

## 2014-08-11 DIAGNOSIS — Z9181 History of falling: Secondary | ICD-10-CM | POA: Diagnosis not present

## 2014-08-12 DIAGNOSIS — Z9181 History of falling: Secondary | ICD-10-CM | POA: Diagnosis not present

## 2014-08-12 DIAGNOSIS — M6281 Muscle weakness (generalized): Secondary | ICD-10-CM | POA: Diagnosis not present

## 2014-08-15 DIAGNOSIS — Z9181 History of falling: Secondary | ICD-10-CM | POA: Diagnosis not present

## 2014-08-15 DIAGNOSIS — M6281 Muscle weakness (generalized): Secondary | ICD-10-CM | POA: Diagnosis not present

## 2014-08-16 ENCOUNTER — Ambulatory Visit: Payer: Medicare Other | Admitting: Family Medicine

## 2014-08-16 ENCOUNTER — Encounter: Payer: Self-pay | Admitting: Internal Medicine

## 2014-08-16 DIAGNOSIS — M6281 Muscle weakness (generalized): Secondary | ICD-10-CM | POA: Diagnosis not present

## 2014-08-16 DIAGNOSIS — Z9181 History of falling: Secondary | ICD-10-CM | POA: Diagnosis not present

## 2014-08-17 ENCOUNTER — Ambulatory Visit: Payer: Medicare Other | Admitting: Family Medicine

## 2014-08-17 ENCOUNTER — Encounter: Payer: Self-pay | Admitting: Family Medicine

## 2014-08-17 ENCOUNTER — Ambulatory Visit (INDEPENDENT_AMBULATORY_CARE_PROVIDER_SITE_OTHER): Payer: Medicare Other | Admitting: Family Medicine

## 2014-08-17 VITALS — BP 118/82 | HR 84 | Temp 98.0°F | Ht <= 58 in | Wt 132.2 lb

## 2014-08-17 DIAGNOSIS — R413 Other amnesia: Secondary | ICD-10-CM | POA: Diagnosis not present

## 2014-08-17 DIAGNOSIS — Z23 Encounter for immunization: Secondary | ICD-10-CM

## 2014-08-17 DIAGNOSIS — F411 Generalized anxiety disorder: Secondary | ICD-10-CM | POA: Diagnosis not present

## 2014-08-17 DIAGNOSIS — R82998 Other abnormal findings in urine: Secondary | ICD-10-CM | POA: Diagnosis not present

## 2014-08-17 DIAGNOSIS — Z9181 History of falling: Secondary | ICD-10-CM

## 2014-08-17 DIAGNOSIS — M6281 Muscle weakness (generalized): Secondary | ICD-10-CM | POA: Diagnosis not present

## 2014-08-17 DIAGNOSIS — R8271 Bacteriuria: Secondary | ICD-10-CM | POA: Insufficient documentation

## 2014-08-17 NOTE — Patient Instructions (Signed)
prevnar and flu shot today  Continue current medicines and PT Alert me if any mental status changes or urinary symptoms

## 2014-08-17 NOTE — Progress Notes (Signed)
Pre visit review using our clinic review tool, if applicable. No additional management support is needed unless otherwise documented below in the visit note. 

## 2014-08-17 NOTE — Progress Notes (Signed)
   Subjective:    Patient ID: Bridget Walker, female    DOB: April 03, 1925, 80 y.o.   MRN: [id number]  HPI Here for f/u of anxiety  Put her on zoloft in July  The staff at assisted living thinks she does better on this than on xanax   Had a fall in assisted living - grabbed hold of a table /lost balance (wiping crumbs)  Then transferred to SNF -for PT and also using walker full time  Aim is to get back to assisted living   Had uti ? - and was thought to be colonized  [name] choose to treat her  encourged to drink more water - now she is doing better  She notices bladder is more full when she does go   Some confusion the first week she was admitted  Now doing much better  Is able to go to the nurses station for her meds and also going to rehab for PT Getting around better  She really wants to get back to assisted living   Family notes her short term memory seems to be a bit better - with improvement of her anxiety and mood She makes an attempt to keep up with family events and grandchildren-important to her   Would like a flu shot today        Review of Systems     Objective:   Physical Exam        Assessment & Plan:

## 2014-08-18 DIAGNOSIS — Z9181 History of falling: Secondary | ICD-10-CM | POA: Insufficient documentation

## 2014-08-18 DIAGNOSIS — M6281 Muscle weakness (generalized): Secondary | ICD-10-CM | POA: Diagnosis not present

## 2014-08-18 NOTE — Assessment & Plan Note (Signed)
Pt has suspected colonization of urine  Will continue to watch for urinary symptoms or abrupt mental status changes

## 2014-08-18 NOTE — Assessment & Plan Note (Signed)
Prompting move to SNF from asst living (she hopes to move back) With PT-her mobility has improved  Her fall occurred while bending over to wipe crumbs from a table -pt realizes that she needs to be more careful Ambulating well with a wheeled walker with seat

## 2014-08-18 NOTE — Assessment & Plan Note (Signed)
Family notes some improvement in her mental status with tx of anxiety with sertraline  Overall has short term memory loss and requires prompting for meds/etc  Now in SNF Family does not want to start any medicine for dementia at this time - will continue to watch

## 2014-08-18 NOTE — Assessment & Plan Note (Signed)
Doing better with low dose sertraline 25 mg  Less agitated/sleeping well  With red in anx symptoms -her dementia/memory symptoms have also improved Plan to continue this instead of xanax  Will continue to monitor Currently in SNF- she hopes to move back to assisted living soon

## 2014-08-19 DIAGNOSIS — Z9181 History of falling: Secondary | ICD-10-CM | POA: Diagnosis not present

## 2014-08-19 DIAGNOSIS — M6281 Muscle weakness (generalized): Secondary | ICD-10-CM | POA: Diagnosis not present

## 2014-08-22 DIAGNOSIS — M6281 Muscle weakness (generalized): Secondary | ICD-10-CM | POA: Diagnosis not present

## 2014-08-22 DIAGNOSIS — Z9181 History of falling: Secondary | ICD-10-CM | POA: Diagnosis not present

## 2014-08-23 DIAGNOSIS — M6281 Muscle weakness (generalized): Secondary | ICD-10-CM | POA: Diagnosis not present

## 2014-08-23 DIAGNOSIS — Z9181 History of falling: Secondary | ICD-10-CM | POA: Diagnosis not present

## 2014-08-25 ENCOUNTER — Ambulatory Visit (INDEPENDENT_AMBULATORY_CARE_PROVIDER_SITE_OTHER): Payer: Medicare Other | Admitting: Cardiovascular Disease

## 2014-08-25 ENCOUNTER — Encounter: Payer: Self-pay | Admitting: Cardiovascular Disease

## 2014-08-25 VITALS — BP 83/60 | HR 107 | Ht 60.0 in | Wt 130.8 lb

## 2014-08-25 DIAGNOSIS — I951 Orthostatic hypotension: Secondary | ICD-10-CM | POA: Diagnosis not present

## 2014-08-25 DIAGNOSIS — R0602 Shortness of breath: Secondary | ICD-10-CM | POA: Diagnosis not present

## 2014-08-25 DIAGNOSIS — J438 Other emphysema: Secondary | ICD-10-CM

## 2014-08-25 DIAGNOSIS — R634 Abnormal weight loss: Secondary | ICD-10-CM | POA: Diagnosis not present

## 2014-08-25 DIAGNOSIS — E7800 Pure hypercholesterolemia, unspecified: Secondary | ICD-10-CM

## 2014-08-25 DIAGNOSIS — Z9181 History of falling: Secondary | ICD-10-CM

## 2014-08-25 DIAGNOSIS — J439 Emphysema, unspecified: Secondary | ICD-10-CM

## 2014-08-25 NOTE — Assessment & Plan Note (Signed)
Currently not on a statin °

## 2014-08-25 NOTE — Progress Notes (Signed)
Patient ID: Bridget Walker, female    DOB: 27-Jan-1925, 80 y.o.   MRN: [id number]  HPI Comments: Bridget Walker is a 80 year old woman with history of large hiatal hernia, underlying pulmonary disease/COPD on chronic prednisone, chronic pancreatitis who lives at [facility],  admission to the hospital 10/01/2013 with discharge 10/04/2013 with shortness of breath. Diagnosed with cardiomyopathy .Cardiac catheterization 10/04/2013 showed no significant CAD  Echocardiogram showed ejection fraction 30%. She was given diuretics with subsequent acute renal failure.  creatinine increased from 1.15 up to 2.58. BUN increased from 28 to 47 . Diuretics were held, fluids initiated with improvement of her renal function back to her baseline . readmitted to the hospital 10/06/2013 for hypotension felt secondary to medications. Systolic pressure was 80. She was given fluids, Lasix was held temporarily. Hold parameters placed on her medications .  In followup today, she presents with her daughter. Daughter reports that yesterday she is doing well but today she has had malaise, unsteady gait, not feeling well or doing well. In the office her blood pressure initially was 83/60. On my recheck systolic was 90. Heart rate has remained at 107 beats per minute. She's able to ambulate with some hesitation, using her walker but does not feel well. She is currently living at [facility]. Daughter reports she had a recent fall after getting up from a table 07/20/2014 and she will be moving back to assisted living on Monday. Thought is very concerned about recent weight loss since the Megace was held. Review of her notes shows 5 pound weight loss since earlier in 2015. Daughter indicates there was a urinary tract infection in mid August of this year. Notes seem to indicate she was treated with Bactrim for 3 days   previous hospital admission for bacteremia, rigors, diaphoresis, chills fever. On evaluation , she was having the symptoms and  blood cultures were ordered.  she was started on IV fluids for hypotension/bacteremia She was initially discharged as blood cultures were negative but they eventually grew gram-negative rods, Enterobacter cloacae. As an outpatient she was started on Levaquin for 14 days. She reports that oh she immediately felt better.   On a previous clinic visit,  blood pressures were low. Lisinopril and Lasix were held in the hospital. She was tolerating low-dose Coreg. In followup clinic her blood pressure had moved back to her baseline and medications were restarted.  Echocardiogram from the hospital 10/01/2013 shows ejection fraction 30-35%, mention of possible anterior wall hypokinesis. This was not noted on cardiac catheterization. Repeat echocardiogram 02/23/2014 shows ejection fraction 25-30%, mildly elevated right ventricular systolic pressures, mild aortic valve stenosi for elevated cardiac enzymes  EKG today shows normal sinus rhythm with rate 107 beats per minute with nonspecific ST Abnormality    Outpatient Encounter Prescriptions as of 08/25/2014  Medication Sig  . acetaminophen (TYLENOL) 500 MG tablet Take by mouth. 1 tab every 6 hrs prn  . ALPRAZolam (XANAX) 0.25 MG tablet Take 0.25 mg by mouth 3 (three) times daily as needed for anxiety.  Marland Kitchen aspirin EC 81 MG tablet Take 81 mg by mouth daily.  . Calcium Carbonate-Vitamin D (CALTRATE 600+D PO) Take 2 tablets by mouth daily.  . carvedilol (COREG) 3.125 MG tablet Take 1 tablet (3.125 mg total) by mouth 2 (two) times daily with a meal.  . esomeprazole (NEXIUM) 40 MG capsule TAKE 1 CAPSULE (40 MG TOTAL) BY MOUTH DAILY BEFORE BREAKFAST.  . furosemide (LASIX) 20 MG tablet Take 1 tablet (20 mg total) by  mouth daily.  Marland Kitchen lisinopril (PRINIVIL,ZESTRIL) 2.5 MG tablet Take 1 tablet (2.5 mg total) by mouth daily.  . NON FORMULARY Oxygen 2 liters at bedtimes.  . Pancrelipase, Lip-Prot-Amyl, 3000-10000 UNITS CPEP Take by mouth 3 (three) times daily.  Bridget Walker Glycol-Propyl Glycol (SYSTANE) 0.4-0.3 % GEL Apply to eye. 1-2 drops in each eye BID prn  . polyethylene glycol powder (GLYCOLAX/MIRALAX) powder Take 17 g by mouth daily as needed.  . potassium chloride (K-DUR) 10 MEQ tablet Take 1 tablet (10 mEq total) by mouth daily.  . predniSONE (DELTASONE) 10 MG tablet Take 1 tablet (10 mg total) by mouth daily with breakfast. Alternate 10 mg and 5 mg every other day  . predniSONE (DELTASONE) 5 MG tablet TAKE 1 TABLET BY MOUTH EVERY OTHER DAY  . sertraline (ZOLOFT) 25 MG tablet Take 1 tablet (25 mg total) by mouth daily. Take at dinner time  . sodium chloride (OCEAN) 0.65 % SOLN nasal spray Place 1 spray into both nostrils as needed for congestion.  . traMADol (ULTRAM) 50 MG tablet TAKE 1 TABLET BY MOUTH TWICE A DAY  . Vitamin D, Ergocalciferol, (DRISDOL) 50000 UNITS CAPS capsule TAKE 1 CAPSULE EVERY 7 DAYS    Review of Systems  Constitutional: Negative.   HENT: Negative.   Eyes: Negative.   Respiratory: Positive for shortness of breath.   Cardiovascular: Positive for leg swelling.  Gastrointestinal: Negative.   Endocrine: Negative.   Musculoskeletal: Positive for gait problem.  Skin: Negative.   Allergic/Immunologic: Negative.   Neurological: Negative.   Hematological: Negative.   Psychiatric/Behavioral: Negative.   All other systems reviewed and are negative.   BP 83/60  Pulse 107  Ht 5' (1.524 m)  Wt 130 lb 12 oz (59.308 kg)  BMI 25.54 kg/m2  Physical Exam  Nursing note and vitals reviewed. Constitutional: She is oriented to person, place, and time. She appears well-developed and well-nourished.  Unstable gait  HENT:  Head: Normocephalic.  Nose: Nose normal.  Mouth/Throat: Oropharynx is clear and moist.  Eyes: Conjunctivae are normal. Pupils are equal, round, and reactive to light.  Neck: Normal range of motion. Neck supple. No JVD present.  Cardiovascular: Normal rate, regular rhythm, S1 normal, S2 normal, normal heart  sounds and intact distal pulses.  Exam reveals no gallop and no friction rub.   No murmur heard. Trace lower extremity edema bilaterally  Pulmonary/Chest: Effort normal and breath sounds normal. No respiratory distress. She has no wheezes. She has no rales. She exhibits no tenderness.  Abdominal: Soft. Bowel sounds are normal. She exhibits no distension. There is no tenderness.  Musculoskeletal: Normal range of motion. She exhibits no edema and no tenderness.  Lymphadenopathy:    She has no cervical adenopathy.  Neurological: She is alert and oriented to person, place, and time. Coordination normal.  Skin: Skin is warm and dry. No rash noted. No erythema.  Psychiatric: She has a normal mood and affect. Her behavior is normal. Judgment and thought content normal.    Assessment and Plan

## 2014-08-25 NOTE — Assessment & Plan Note (Signed)
Blood pressure is low again today. Daughter is concerned about infection. We will request a urinalysis and urine culture as she reports she had a previous urinary tract infection in August of this year. We will hold her lisinopril and Lasix. In the past, holding these medications has improved her blood pressure. Unable to definitively exclude infection. General malaise is a concern as well.

## 2014-08-25 NOTE — Patient Instructions (Signed)
Your blood pressure is low today  Please hold the lasix, Hold the lisinopril  We will order a U/A and urine culture given your recent infection  We will draw blood today  Please call us if you have new issues that need to be addressed before your next appt.  Your physician wants you to follow-up in: 1 month.

## 2014-08-25 NOTE — Assessment & Plan Note (Signed)
Daughter is concerned about weight loss. She feels that she was doing better on the Megace. We have mentioned that we will send a note to primary care

## 2014-08-25 NOTE — Assessment & Plan Note (Signed)
Recent fall. Daughter indicates she will be going back to assisted living

## 2014-08-25 NOTE — Assessment & Plan Note (Signed)
She reports that her breathing has been relatively stable

## 2014-08-26 ENCOUNTER — Telehealth: Payer: Self-pay | Admitting: Family Medicine

## 2014-08-26 DIAGNOSIS — R059 Cough, unspecified: Secondary | ICD-10-CM | POA: Diagnosis not present

## 2014-08-26 DIAGNOSIS — R05 Cough: Secondary | ICD-10-CM

## 2014-08-26 LAB — BASIC METABOLIC PANEL
BUN/Creatinine Ratio: 21 (ref 11–26)
BUN: 19 mg/dL (ref 8–27)
CALCIUM: 9.5 mg/dL (ref 8.7–10.3)
CO2: 23 mmol/L (ref 18–29)
Chloride: 97 mmol/L (ref 97–108)
Creatinine, Ser: 0.92 mg/dL (ref 0.57–1.00)
GFR calc Af Amer: 65 mL/min/{1.73_m2} (ref >59)
GFR calc non Af Amer: 56 mL/min/{1.73_m2} — ABNORMAL LOW (ref >59)
Glucose: 153 mg/dL — ABNORMAL HIGH (ref 65–99)
POTASSIUM: 5.2 mmol/L (ref 3.5–5.2)
SODIUM: 138 mmol/L (ref 134–144)

## 2014-08-26 LAB — CBC WITH DIFFERENTIAL
BASOS: 0 %
Basophils Absolute: 0 10*3/uL (ref 0.0–0.2)
EOS ABS: 0 10*3/uL (ref 0.0–0.4)
Eos: 0 %
HCT: 34.8 % (ref 34.0–46.6)
Hemoglobin: 11.4 g/dL (ref 11.1–15.9)
IMMATURE GRANS (ABS): 0 10*3/uL (ref 0.0–0.1)
Immature Granulocytes: 0 %
Lymphocytes Absolute: 1.3 10*3/uL (ref 0.7–3.1)
Lymphs: 12 %
MCH: 31 pg (ref 26.6–33.0)
MCHC: 32.8 g/dL (ref 31.5–35.7)
MCV: 95 fL (ref 79–97)
MONOS ABS: 0.8 10*3/uL (ref 0.1–0.9)
Monocytes: 7 %
NEUTROS PCT: 81 %
Neutrophils Absolute: 8.6 10*3/uL — ABNORMAL HIGH (ref 1.4–7.0)
Platelets: 293 10*3/uL (ref 150–379)
RBC: 3.68 x10E6/uL — ABNORMAL LOW (ref 3.77–5.28)
RDW: 14.1 % (ref 12.3–15.4)
WBC: 10.7 10*3/uL (ref 3.4–10.8)

## 2014-08-26 MED ORDER — MEGESTROL ACETATE 40 MG/ML PO SUSP: 400.0000 mg | Freq: Every day | ORAL | Status: DC

## 2014-08-26 NOTE — Telephone Encounter (Signed)
Pt's daughter called and says she took Bridget Walker to see Dr. Rockey Situ and he is concerned b/c she has dropped 5+ lbs since Spring.  Dr. Silvio Pate had taken Ms. Rufer off the [employer] (spelling) at [facility]  b/c she seemed to be maintaining her weight, but now she has lost her appetite and Dr. Rockey Situ says you may want to consider putting her back on it.  Daughter states Dr. Rockey Situ suspects pt still has a UTI, her pulse rate was up and b/p was low. He's going to send an order to [facility] for another urine culture to check for UTI.  Daughter would like a c/b. Thank you.

## 2014-08-26 NOTE — Telephone Encounter (Signed)
I printed a px for megace to fax  Checking urine culture is a good idea

## 2014-08-26 NOTE — Telephone Encounter (Signed)
Pt notified Rx will be restarted, Rx sent to [facility] advise to restart Rx

## 2014-08-29 DIAGNOSIS — R05 Cough: Secondary | ICD-10-CM | POA: Diagnosis not present

## 2014-08-29 DIAGNOSIS — R918 Other nonspecific abnormal finding of lung field: Secondary | ICD-10-CM | POA: Diagnosis not present

## 2014-08-29 DIAGNOSIS — R059 Cough, unspecified: Secondary | ICD-10-CM | POA: Diagnosis not present

## 2014-08-29 DIAGNOSIS — J209 Acute bronchitis, unspecified: Secondary | ICD-10-CM | POA: Diagnosis not present

## 2014-08-29 DIAGNOSIS — F068 Other specified mental disorders due to known physiological condition: Secondary | ICD-10-CM

## 2014-08-29 DIAGNOSIS — J90 Pleural effusion, not elsewhere classified: Secondary | ICD-10-CM | POA: Diagnosis not present

## 2014-09-05 DIAGNOSIS — J13 Pneumonia due to Streptococcus pneumoniae: Secondary | ICD-10-CM | POA: Diagnosis not present

## 2014-09-06 ENCOUNTER — Other Ambulatory Visit: Payer: Self-pay | Admitting: Family Medicine

## 2014-09-06 NOTE — Telephone Encounter (Signed)
Electronic refill request, please advise  

## 2014-09-06 NOTE — Telephone Encounter (Signed)
Looking at past med list pt has been taking this for over a yr., do you still want her to D/C med?

## 2014-09-06 NOTE — Telephone Encounter (Signed)
Yes-her last vit D level was in the high normal range-thanks

## 2014-09-06 NOTE — Telephone Encounter (Signed)
If she finished 12 weeks of this (the 12 caps)- can stop it and take off med list Thanks

## 2014-09-07 NOTE — Telephone Encounter (Signed)
Rx declined, med removed from med list, and order sent to [facility] to D/C med

## 2014-09-09 ENCOUNTER — Ambulatory Visit (INDEPENDENT_AMBULATORY_CARE_PROVIDER_SITE_OTHER): Payer: Medicare Other | Admitting: Family Medicine

## 2014-09-09 ENCOUNTER — Encounter: Payer: Self-pay | Admitting: Family Medicine

## 2014-09-09 VITALS — BP 110/70 | HR 108 | Temp 97.2°F | Wt 121.5 lb

## 2014-09-09 DIAGNOSIS — R413 Other amnesia: Secondary | ICD-10-CM

## 2014-09-09 DIAGNOSIS — F05 Delirium due to known physiological condition: Secondary | ICD-10-CM | POA: Diagnosis not present

## 2014-09-09 DIAGNOSIS — R300 Dysuria: Secondary | ICD-10-CM | POA: Diagnosis not present

## 2014-09-09 DIAGNOSIS — R410 Disorientation, unspecified: Secondary | ICD-10-CM

## 2014-09-09 DIAGNOSIS — N390 Urinary tract infection, site not specified: Secondary | ICD-10-CM | POA: Diagnosis not present

## 2014-09-09 LAB — POCT URINALYSIS DIPSTICK
Bilirubin, UA: NEGATIVE
KETONES UA: NEGATIVE
NITRITE UA: NEGATIVE
Protein, UA: 0.15
RBC UA: NEGATIVE
Spec Grav, UA: >=1.03
Urobilinogen, UA: NEGATIVE
pH, UA: 6

## 2014-09-09 LAB — CBC WITH DIFFERENTIAL/PLATELET
BASOS ABS: 0 10*3/uL (ref 0.0–0.1)
Basophils Relative: 0 % (ref 0–1)
EOS PCT: 0 % (ref 0–5)
Eosinophils Absolute: 0 10*3/uL (ref 0.0–0.7)
HCT: 36.1 % (ref 36.0–46.0)
Hemoglobin: 11.7 g/dL — ABNORMAL LOW (ref 12.0–15.0)
LYMPHS PCT: 16 % (ref 12–46)
Lymphs Abs: 1.2 10*3/uL (ref 0.7–4.0)
MCH: 29.9 pg (ref 26.0–34.0)
MCHC: 32.4 g/dL (ref 30.0–36.0)
MCV: 92.3 fL (ref 78.0–100.0)
Monocytes Absolute: 0.3 10*3/uL (ref 0.1–1.0)
Monocytes Relative: 4 % (ref 3–12)
NEUTROS ABS: 6 10*3/uL (ref 1.7–7.7)
Neutrophils Relative %: 80 % — ABNORMAL HIGH (ref 43–77)
PLATELETS: 413 10*3/uL — AB (ref 150–400)
RBC: 3.91 MIL/uL (ref 3.87–5.11)
RDW: 14.2 % (ref 11.5–15.5)
WBC: 7.5 10*3/uL (ref 4.0–10.5)

## 2014-09-09 MED ORDER — SULFAMETHOXAZOLE-TMP DS 800-160 MG PO TABS: 1.0000 | ORAL_TABLET | Freq: Two times a day (BID) | ORAL | Status: DC

## 2014-09-09 NOTE — Progress Notes (Signed)
Pre visit review using our clinic review tool, if applicable. No additional management support is needed unless otherwise documented below in the visit note.  To recap recent events- prev with possible UTI, treated concurrently with possible PNA. She recovered some, but not fully back to baseline since PNA tx.  Now more agitated. Daughter concerned about possible dehydration. Some SOB, at baseline.  No fevers.  No burning with urination.    Daughter was concerned about possible dementia dx, I will defer to PCP, esp with patient having recent events/changes noted.   Meds, vitals, and allergies reviewed.   ROS: See HPI.  Otherwise, noncontributory.  nad but doesn't know the year or century, thinks it is the early 61s.  ncat Mmm rrr Ctab abd soft, not ttp Ext wo edema Normal skin turgor

## 2014-09-09 NOTE — Patient Instructions (Signed)
Drink plenty of water and start the antibiotics today.  We'll contact you with your lab report.  Take care.   I'll be in touch with Dr. Glori Bickers.   Encourage an extra 8 oz glass of water 2-3 times a day to keep your urine clear or light colored.

## 2014-09-10 LAB — COMPREHENSIVE METABOLIC PANEL
ALBUMIN: 3.9 g/dL (ref 3.5–5.2)
ALT: 19 U/L (ref 0–35)
AST: 107 U/L — ABNORMAL HIGH (ref 0–37)
Alkaline Phosphatase: 134 U/L — ABNORMAL HIGH (ref 39–117)
BUN: 19 mg/dL (ref 6–23)
CALCIUM: 9.7 mg/dL (ref 8.4–10.5)
CHLORIDE: 100 meq/L (ref 96–112)
CO2: 25 meq/L (ref 19–32)
Creat: 1.07 mg/dL (ref 0.50–1.10)
Glucose, Bld: 181 mg/dL — ABNORMAL HIGH (ref 70–99)
Potassium: 5.2 mEq/L (ref 3.5–5.3)
SODIUM: 135 meq/L (ref 135–145)
Total Bilirubin: 0.5 mg/dL (ref 0.2–1.2)
Total Protein: 7.2 g/dL (ref 6.0–8.3)

## 2014-09-12 ENCOUNTER — Telehealth: Payer: Self-pay

## 2014-09-12 LAB — URINE CULTURE: Colony Count: 85000

## 2014-09-12 NOTE — Telephone Encounter (Signed)
pts daughter, [name] left v/m; pt is resident at assisted [facility]; pt was seen on 09/09/14; pt is still confused, pt taking antibiotics for UTI and pneumonia. [name] request cb with lab results also wanting Dr Marliss Coots opinion of if pts mother needs different antibiotic. Dr Rockey Situ had discussed with [name] previously about doing a sterile blood work up test and wants to know what [name] thinks about that. [name] request order resent to assisted living for Megace due to pt's weight loss. [name] wants to know if should increase Zoloft; pt still taking alprazolam. [name] request cb.

## 2014-09-12 NOTE — Assessment & Plan Note (Addendum)
I'll defer to PCP re: possible formal dx of dementia.  In meantime, she could have more agitation from a UTI.   See notes on labs.  She doesn't look acutely ill, though her pulse is slightly up.   Start abx for presumed UTI, inc fluids.  See notes on labs.  >25 minutes spent in face to face time with patient, >50% spent in counselling or coordination of care.

## 2014-09-12 NOTE — Telephone Encounter (Signed)
I would like to get the final urine culture back before changing any antibiotics -hopefully that will be soon  I rev Dr Josefine Class note and also last comments from [name]  How much weight has she lost?  [name] was worried that her mental status change/ confusion/ memory issues may be more from some dementia than an infection - and also worried about side effects of megace as well  I would like her to come in for another visit when they can and touch base again (30 min if possible)

## 2014-09-12 NOTE — Telephone Encounter (Signed)
I am going to send this by [name] before responding since he cares for her at [facility]  It also looks like she saw Dr Damita Dunnings on 10/2 for confusion

## 2014-09-12 NOTE — Telephone Encounter (Signed)
Actually she left skilled care last Wednesday. By the next day, she was confused in the beauty shop and had to be escorted back to her room and almost got sent back to skilled care.  I believe this is her new baseline and may well need to go back to skilled care---but for now she is back under your care and the daughter seems to be grasping at straws to explain her dementia based on some other condition (like an infection which I don't think exists--she had been successfully treated for pneumonia before she left and I saw her last week and she was fine. She has been getting very confused and failed a trial going down to meals on her own--but wanted a chance at assisted living again. She reportedly fired the aides that her daughter hired to give her support in assisted living)

## 2014-09-13 ENCOUNTER — Other Ambulatory Visit: Payer: Self-pay | Admitting: Family Medicine

## 2014-09-13 MED ORDER — CIPROFLOXACIN HCL 250 MG PO TABS: 250.0000 mg | ORAL_TABLET | Freq: Two times a day (BID) | ORAL | Status: DC

## 2014-09-13 NOTE — Telephone Encounter (Signed)
That sounds like a good plan- let's touch base at that visit and make a plan

## 2014-09-13 NOTE — Telephone Encounter (Signed)
I advise pt's daughter of urine cx results and that Dr. Damita Dunnings sent in [place].  Pt's daughter said she was very upset how her mother's care/treatment was in skilled nursing. When pt returned back to assisted living it was the nurses there that advise her to bring her mom in for an appt. Pt schedule a f/u with you for 09/19/14 to discuss her mother's health because since Dr. Silvio Pate has stopped pt's megace she has lost about 10lbs in 6 weeks and daughter is concerned. She said she will discuss it in detail with you at her f/u appt on Monday

## 2014-09-14 ENCOUNTER — Telehealth: Payer: Self-pay

## 2014-09-14 MED ORDER — CIPROFLOXACIN HCL 250 MG PO TABS: 250.0000 mg | ORAL_TABLET | Freq: Two times a day (BID) | ORAL | Status: DC

## 2014-09-14 NOTE — Telephone Encounter (Signed)
Please fax order: Take 1 tablet (250 mg total) by mouth 2 (two) times daily. 10 pills.  Thanks.

## 2014-09-14 NOTE — Telephone Encounter (Signed)
Katie nurse with independent living at [facility] left v/m requesting written order for Cipro faxed to [phone].

## 2014-09-14 NOTE — Telephone Encounter (Signed)
Order faxed to [facility] as instructed. Left detailed message on [name]'s voicemail at [facility] that this has been faxed.

## 2014-09-14 NOTE — Telephone Encounter (Signed)
Spoke to [place] at [facility] and was advised that she did receive the order.

## 2014-09-15 ENCOUNTER — Telehealth: Payer: Self-pay

## 2014-09-15 NOTE — Telephone Encounter (Signed)
Amber at [facility] said on 09/08/14 order was phoned in for Vit D 50,000 unit taking once a month. Amber needs to know if was Vit D 2 or Vit D3. Spoke with Joellen Jersey at [facility] and she will call pharmacare to advise further about Vit D order.

## 2014-09-19 ENCOUNTER — Ambulatory Visit: Payer: Medicare Other | Admitting: Family Medicine

## 2014-09-19 ENCOUNTER — Encounter: Payer: Self-pay | Admitting: Family Medicine

## 2014-09-19 ENCOUNTER — Ambulatory Visit (INDEPENDENT_AMBULATORY_CARE_PROVIDER_SITE_OTHER): Payer: Medicare Other | Admitting: Family Medicine

## 2014-09-19 VITALS — BP 108/76 | HR 95 | Temp 98.1°F | Ht <= 58 in | Wt 127.8 lb

## 2014-09-19 DIAGNOSIS — R413 Other amnesia: Secondary | ICD-10-CM | POA: Diagnosis not present

## 2014-09-19 DIAGNOSIS — N3000 Acute cystitis without hematuria: Secondary | ICD-10-CM

## 2014-09-19 DIAGNOSIS — N390 Urinary tract infection, site not specified: Secondary | ICD-10-CM

## 2014-09-19 DIAGNOSIS — R8271 Bacteriuria: Secondary | ICD-10-CM

## 2014-09-19 DIAGNOSIS — R634 Abnormal weight loss: Secondary | ICD-10-CM | POA: Diagnosis not present

## 2014-09-19 DIAGNOSIS — R739 Hyperglycemia, unspecified: Secondary | ICD-10-CM

## 2014-09-19 DIAGNOSIS — E119 Type 2 diabetes mellitus without complications: Secondary | ICD-10-CM | POA: Insufficient documentation

## 2014-09-19 DIAGNOSIS — R8299 Other abnormal findings in urine: Secondary | ICD-10-CM | POA: Diagnosis not present

## 2014-09-19 LAB — COMPREHENSIVE METABOLIC PANEL
ALT: 29 U/L (ref 0–35)
AST: 108 U/L — ABNORMAL HIGH (ref 0–37)
Albumin: 3.5 g/dL (ref 3.5–5.2)
Alkaline Phosphatase: 93 U/L (ref 39–117)
BUN: 23 mg/dL (ref 6–23)
CALCIUM: 9.5 mg/dL (ref 8.4–10.5)
CHLORIDE: 103 meq/L (ref 96–112)
CO2: 22 meq/L (ref 19–32)
CREATININE: 1.1 mg/dL (ref 0.4–1.2)
GFR: 49.4 mL/min — AB (ref >60.00)
Glucose, Bld: 166 mg/dL — ABNORMAL HIGH (ref 70–99)
Potassium: 5.2 mEq/L — ABNORMAL HIGH (ref 3.5–5.1)
Sodium: 135 mEq/L (ref 135–145)
Total Bilirubin: 0.5 mg/dL (ref 0.2–1.2)
Total Protein: 7.8 g/dL (ref 6.0–8.3)

## 2014-09-19 LAB — HEMOGLOBIN A1C: Hgb A1c MFr Bld: 6.3 % (ref 4.6–6.5)

## 2014-09-19 NOTE — Assessment & Plan Note (Signed)
Likely from chronic steroids for copd  Non fasting gluc 180s A1C today Will tx if needed Pt aware Have not restricted diet in light of her wt loss in the past  F/u planned 1 mo

## 2014-09-19 NOTE — Assessment & Plan Note (Signed)
Much clinical improvement after treatment with cipro  Re check urine cx today Has had colonization in the past  Also f/u 1 mo

## 2014-09-19 NOTE — Assessment & Plan Note (Signed)
Pt has gained 6 lb since tx for uti and appetite back  Will continue to monitor Do not think she needs megace currently- but will follow closely

## 2014-09-19 NOTE — Assessment & Plan Note (Signed)
tx for pos cx - clinical improvement  Re check today s/p course of cipro

## 2014-09-19 NOTE — Assessment & Plan Note (Signed)
Difficult to assess in setting of recent uti Per daughter - she thinks she is stable  Have not started medication - family would rather not unless necessary Briefly disc aricept/that class of medication  Will re visit at f/u  Pt and daughter feel safe with her current level of care -assist living with home care

## 2014-09-19 NOTE — Progress Notes (Signed)
Subjective:    Patient ID: Bridget Walker, female    DOB: Aug 15, 1925, 80 y.o.   MRN: [id number]  HPI Here for f/u for uti and acute confusion   She has been treated with cipro    (after originally on bactrim)  The past several days much much better  Staff noted she was confused/ hallucinating / worse memory when she had the infection  Did have a pos urine cx that was sensitive to infection   No urinary symptoms right now  No burning  No increased frequency than usual   Wt is up 6 lb  Not on the megace right now  She is getting to the dining room for meals (lunch and dinner)- often eats breakfast in her room   She also has home care coming in to help with bathing  [facility] and her cleaning and laundry are done for her and trash taken out, also wt daily and bp once per week and pulse ox  They also help with oxygen  Does not need help toileting at all   According to facility - she did have a lot of anxiety  Was given xanax times one  Pt thinks she has been ok "in terms of anxiety"  (had prev lost 10 lb - and daughter was concerned)    Results for orders placed in visit on 09/09/14  URINE CULTURE      Result Value Ref Range   Culture ENTEROBACTER CLOACAE     Colony Count 85,000 COLONIES/ML     Organism ID, Bacteria ENTEROBACTER CLOACAE    COMPREHENSIVE METABOLIC PANEL      Result Value Ref Range   Sodium 135  135 - 145 mEq/L   Potassium 5.2  3.5 - 5.3 mEq/L   Chloride 100  96 - 112 mEq/L   CO2 25  19 - 32 mEq/L   Glucose, Bld 181 (*) 70 - 99 mg/dL   BUN 19  6 - 23 mg/dL   Creat 1.07  0.50 - 1.10 mg/dL   Total Bilirubin 0.5  0.2 - 1.2 mg/dL   Alkaline Phosphatase 134 (*) 39 - 117 U/L   AST 107 (*) 0 - 37 U/L   ALT 19  0 - 35 U/L   Total Protein 7.2  6.0 - 8.3 g/dL   Albumin 3.9  3.5 - 5.2 g/dL   Calcium 9.7  8.4 - 10.5 mg/dL  CBC WITH DIFFERENTIAL      Result Value Ref Range   WBC 7.5  4.0 - 10.5 K/uL   RBC 3.91  3.87 - 5.11  MIL/uL   Hemoglobin 11.7 (*) 12.0 - 15.0 g/dL   HCT 36.1  36.0 - 46.0 %   MCV 92.3  78.0 - 100.0 fL   MCH 29.9  26.0 - 34.0 pg   MCHC 32.4  30.0 - 36.0 g/dL   RDW 14.2  11.5 - 15.5 %   Platelets 413 (*) 150 - 400 K/uL   Neutrophils Relative % 80 (*) 43 - 77 %   Neutro Abs 6.0  1.7 - 7.7 K/uL   Lymphocytes Relative 16  12 - 46 %   Lymphs Abs 1.2  0.7 - 4.0 K/uL   Monocytes Relative 4  3 - 12 %   Monocytes Absolute 0.3  0.1 - 1.0 K/uL   Eosinophils Relative 0  0 - 5 %   Eosinophils Absolute 0.0  0.0 - 0.7 K/uL  Basophils Relative 0  0 - 1 %   Basophils Absolute 0.0  0.0 - 0.1 K/uL   Smear Review Criteria for review not met    POCT URINALYSIS DIPSTICK      Result Value Ref Range   Color, UA yellow     Clarity, UA clear     Glucose, UA 2+     Bilirubin, UA negative     Ketones, UA negative     Spec Grav, UA >=1.030     Blood, UA negative     pH, UA 6.0     Protein, UA 0.15     Urobilinogen, UA negative     Nitrite, UA negative     Leukocytes, UA small (1+)      Still takes prednisone - from Dr Annamaria Boots  Has to be on it for her breathing  Alt 5 and 10 mg every other day  Unable to wean it further   Memory loss is still evident  Disc pros and cons of medication  Daughter does not think memory is any worse (with the exception of the confusion from her uti)    Patient Active Problem List   Diagnosis Date Noted  . Hyperglycemia 09/19/2014  . UTI (urinary tract infection) 09/19/2014  . History of fall 08/18/2014  . Bacteria in urine 08/17/2014  . Encounter for Medicare annual wellness exam 06/15/2014  . Memory loss 06/15/2014  . Anxiety disorder 06/15/2014  . Orthostatic hypotension 03/10/2014  . Blood bacterial culture positive 03/02/2014  . Nocturnal hypoxemia 01/18/2014  . Chronic systolic CHF (congestive heart failure) 10/20/2013  . Compression fracture of spine 06/10/2013  . Back pain of thoracolumbar region 06/09/2013  . Mobility impaired 04/16/2013  . Frequent  PVCs 03/28/2013  . COPD with emphysema 03/28/2013  . Lung nodule, L lower lobe 10/15/2012  . Lobar pneumonia due to unspecified organism 06/11/2012  . Abnormal CT scan, gastrointestinal tract 03/24/2012  . Nonspecific (abnormal) findings on radiological and other examination of biliary tract 03/24/2012  . Weight loss 03/18/2012  . Fatigue 03/18/2012  . Jaundice 03/18/2012  . Loss of appetite 03/18/2012  . Anemia 11/29/2011  . TINNITUS 08/09/2010  . OVERACTIVE BLADDER 03/14/2010  . GERD 11/06/2009  . UNSPECIFIED VITAMIN D DEFICIENCY 10/31/2008  . DIVERTICULOSIS OF COLON 12/25/2007  . RESTLESS LEG SYNDROME 10/26/2007  . COLONIC POLYPS, HX OF 10/26/2007  . HYPERCHOLESTEROLEMIA 09/23/2007  . OSTEOARTHRITIS 09/23/2007  . OSTEOPOROSIS 09/23/2007   Past Medical History  Diagnosis Date  . Osteoporosis   . Hyperlipidemia   . RLS (restless legs syndrome)   . Vitamin D deficiency   . GERD (gastroesophageal reflux disease)   . Cerumen impaction     recurrent  . Unexplained weight loss   . Cough   . Constipation   . Diarrhea   . Shingles   . Overactive bladder   . Cancer     pancreatic  . Arthritis     OA, in hands  . CHF (congestive heart failure)   . Pancreatic insufficiency   . Hiatal hernia    Past Surgical History  Procedure Laterality Date  . Cataract extraction      patient unsure of date procedure was done.  . Cataract extraction  11/08    2nd cataract  . Ercp  03/24/2012    Procedure: ENDOSCOPIC RETROGRADE CHOLANGIOPANCREATOGRAPHY (ERCP);  Surgeon: Milus Banister, MD;  Location: [facility];  Service: Endoscopy;  Laterality: N/A;  . Eus  04/02/2012    Procedure:  ESOPHAGEAL ENDOSCOPIC ULTRASOUND (EUS) RADIAL;  Surgeon: Milus Banister, MD;  Location: [facility];  Service: Endoscopy;  Laterality: N/A;  EUS with FNA  . Fine needle aspiration  04/02/2012    Procedure: FINE NEEDLE ASPIRATION (FNA) LINEAR;  Surgeon: Milus Banister, MD;  Location: [facility];   Service: Endoscopy;  Laterality: N/A;  . Endoscopic retrograde cholangiopancreatography (ercp) with propofol N/A 01/28/2013    Procedure: ENDOSCOPIC RETROGRADE CHOLANGIOPANCREATOGRAPHY (ERCP) WITH PROPOFOL;  Surgeon: Milus Banister, MD;  Location: [facility];  Service: Endoscopy;  Laterality: N/A;  . Cardiac catheterization  10/14    [facility];no stent   History  Substance Use Topics  . Smoking status: Former Smoker -- 0.25 packs/day for 20 years    Types: Cigarettes    Quit date: 03/23/1992  . Smokeless tobacco: Never Used     Comment: PT SMOKED 2 CIGARETTES A DAY   . Alcohol Use: No   Family History  Problem Relation Age of Onset  . Diabetes Sister   . Hypertension Sister   . Heart disease Brother   . Fibromyalgia Daughter   . Alcohol abuse Son   . Heart disease Brother   . Diabetes Brother   . Heart disease Mother   . Pneumonia Father   . Heart failure Father   . Cancer Other     niece had breast and bone cancer   Allergies  Allergen Reactions  . Lactose Intolerance (Gi)     Pt can drink skim milk  . Hydrocodone Rash    Patient unsure of reaction, but it may have been diarrhea.  . Penicillins Rash    On scalp   Current Outpatient Prescriptions on File Prior to Visit  Medication Sig Dispense Refill  . acetaminophen (TYLENOL) 500 MG tablet Take by mouth. 1 tab every 6 hrs prn      . aspirin EC 81 MG tablet Take 81 mg by mouth daily.      . Calcium Carbonate-Vitamin D (CALTRATE 600+D PO) Take 2 tablets by mouth daily.      . carvedilol (COREG) 3.125 MG tablet Take 1 tablet (3.125 mg total) by mouth 2 (two) times daily with a meal.  180 tablet  3  . esomeprazole (NEXIUM) 40 MG capsule TAKE 1 CAPSULE (40 MG TOTAL) BY MOUTH DAILY BEFORE BREAKFAST.  90 capsule  3  . furosemide (LASIX) 20 MG tablet Take 1 tablet (20 mg total) by mouth daily.  90 tablet  3  . lisinopril (PRINIVIL,ZESTRIL) 2.5 MG tablet Take 1 tablet (2.5 mg total) by mouth daily.  90 tablet  3  . NON FORMULARY  Oxygen 2 liters at bedtimes.      . Pancrelipase, Lip-Prot-Amyl, 3000-10000 UNITS CPEP Take by mouth 3 (three) times daily.      Vladimir Faster Glycol-Propyl Glycol (SYSTANE) 0.4-0.3 % GEL Apply to eye. 1-2 drops in each eye BID prn      . polyethylene glycol powder (GLYCOLAX/MIRALAX) powder Take 17 g by mouth daily as needed.  527 g  3  . potassium chloride (K-DUR) 10 MEQ tablet Take 1 tablet (10 mEq total) by mouth daily.  90 tablet  3  . predniSONE (DELTASONE) 10 MG tablet Take 1 tablet (10 mg total) by mouth daily with breakfast. Alternate 10 mg and 5 mg every other day  30 tablet  1  . predniSONE (DELTASONE) 5 MG tablet TAKE 1 TABLET BY MOUTH EVERY OTHER DAY  20 tablet  0  . sertraline (ZOLOFT) 25 MG  tablet Take 1 tablet (25 mg total) by mouth daily. Take at dinner time  30 tablet  5  . sodium chloride (OCEAN) 0.65 % SOLN nasal spray Place 1 spray into both nostrils as needed for congestion.      . traMADol (ULTRAM) 50 MG tablet TAKE 1 TABLET BY MOUTH TWICE A DAY  20 tablet  0   No current facility-administered medications on file prior to visit.    Review of Systems  Constitutional: Positive for appetite change and fatigue. Negative for fever, activity change and unexpected weight change.  HENT: Negative for congestion, ear pain, rhinorrhea, sinus pressure and sore throat.   Eyes: Negative for pain, redness and visual disturbance.  Respiratory: Positive for shortness of breath. Negative for cough, choking, chest tightness and wheezing.   Cardiovascular: Negative for chest pain and palpitations.  Gastrointestinal: Negative for nausea, abdominal pain, diarrhea, constipation and blood in stool.  Endocrine: Negative for polydipsia, polyphagia and polyuria.  Genitourinary: Negative for dysuria, urgency and frequency.  Musculoskeletal: Negative for arthralgias, back pain and myalgias.  Skin: Negative for pallor and rash.  Allergic/Immunologic: Negative for environmental allergies.    Neurological: Negative for dizziness, syncope and headaches.  Hematological: Negative for adenopathy. Does not bruise/bleed easily.  Psychiatric/Behavioral: Positive for hallucinations, confusion and agitation. Negative for sleep disturbance, dysphoric mood and decreased concentration. The patient is nervous/anxious.        Objective:   Physical Exam  Constitutional: She appears well-developed and well-nourished. No distress.  HENT:  Head: Normocephalic and atraumatic.  Mouth/Throat: Oropharynx is clear and moist.  Eyes: Conjunctivae and EOM are normal. Pupils are equal, round, and reactive to light. Right eye exhibits no discharge. Left eye exhibits no discharge. No scleral icterus.  Neck: Normal range of motion. Neck supple. No JVD present. Carotid bruit is not present. No thyromegaly present.  Cardiovascular: Normal rate, regular rhythm and normal heart sounds.  Exam reveals no gallop.   Pulmonary/Chest: Effort normal and breath sounds normal. No respiratory distress. She has no wheezes. She has no rales.  Diffusely distant bs No crackles or rales  Wheeze on forced expiration   Abdominal: Soft. Bowel sounds are normal. She exhibits no distension and no mass. There is no tenderness.  No suprapubic tenderness or fullness    No cva tenderness   Musculoskeletal: She exhibits no edema.  No pitting edema   Lymphadenopathy:    She has no cervical adenopathy.  Neurological: She is alert. She has normal reflexes. No cranial nerve deficit. She exhibits normal muscle tone. Coordination normal.  Skin: Skin is warm and dry. No rash noted. No erythema. No pallor.  No jaundice   Psychiatric: Her mood appears anxious. Her speech is tangential. She is not agitated. Thought content is not paranoid. Cognition and memory are impaired. She expresses no homicidal and no suicidal ideation. She exhibits abnormal recent memory.  Pleasant  Repeats herself  Tangential in speech           Assessment  & Plan:   Problem List Items Addressed This Visit     Genitourinary   UTI (urinary tract infection)     Much clinical improvement after treatment with cipro  Re check urine cx today Has had colonization in the past  Also f/u 1 mo     Relevant Orders      Urine culture     Other   Weight loss     Pt has gained 6 lb since tx for uti and appetite  back  Will continue to monitor Do not think she needs megace currently- but will follow closely    Memory loss - Primary     Difficult to assess in setting of recent uti Per daughter - she thinks she is stable  Have not started medication - family would rather not unless necessary Briefly disc aricept/that class of medication  Will re visit at f/u  Pt and daughter feel safe with her current level of care -assist living with home care      Bacteria in urine     tx for pos cx - clinical improvement  Re check today s/p course of cipro    Relevant Orders      Urine culture   Hyperglycemia     Likely from chronic steroids for copd  Non fasting gluc 180s A1C today Will tx if needed Pt aware Have not restricted diet in light of her wt loss in the past  F/u planned 1 mo     Relevant Orders      Comprehensive metabolic panel (Completed)      Hemoglobin A1c (Completed)

## 2014-09-19 NOTE — Progress Notes (Signed)
Pre visit review using our clinic review tool, if applicable. No additional management support is needed unless otherwise documented below in the visit note. 

## 2014-09-19 NOTE — Patient Instructions (Signed)
Urine culture repeat today I'm glad you are doing better  We may consider a urology consult if we cannot get her urine clear  We need to follow blood sugar while on prednisone  A1C test today and I am also re checking chemistries   Follow up with me in about a month

## 2014-09-21 ENCOUNTER — Ambulatory Visit: Payer: Medicare Other | Admitting: Family Medicine

## 2014-09-21 LAB — URINE CULTURE: Colony Count: 70000

## 2014-09-26 DIAGNOSIS — R0989 Other specified symptoms and signs involving the circulatory and respiratory systems: Secondary | ICD-10-CM | POA: Diagnosis not present

## 2014-09-27 ENCOUNTER — Ambulatory Visit (INDEPENDENT_AMBULATORY_CARE_PROVIDER_SITE_OTHER): Payer: Medicare Other | Admitting: Cardiovascular Disease

## 2014-09-27 ENCOUNTER — Encounter: Payer: Self-pay | Admitting: Cardiovascular Disease

## 2014-09-27 VITALS — BP 100/78 | HR 84 | Ht 59.0 in | Wt 127.8 lb

## 2014-09-27 DIAGNOSIS — R634 Abnormal weight loss: Secondary | ICD-10-CM

## 2014-09-27 DIAGNOSIS — I951 Orthostatic hypotension: Secondary | ICD-10-CM

## 2014-09-27 DIAGNOSIS — I5022 Chronic systolic (congestive) heart failure: Secondary | ICD-10-CM

## 2014-09-27 DIAGNOSIS — N390 Urinary tract infection, site not specified: Secondary | ICD-10-CM | POA: Diagnosis not present

## 2014-09-27 DIAGNOSIS — R8271 Bacteriuria: Secondary | ICD-10-CM

## 2014-09-27 NOTE — Progress Notes (Signed)
Patient ID: Bridget Walker, female    DOB: 21-Jan-1925, 80 y.o.   MRN: [id number]  HPI Comments: Bridget Walker is a 80 year old woman with history of large hiatal hernia, underlying pulmonary disease/COPD on chronic prednisone, chronic pancreatitis who lives at [facility],  admission to the hospital 10/01/2013 with discharge 10/04/2013 with shortness of breath. Diagnosed with cardiomyopathy .Cardiac catheterization 10/04/2013 showed no significant CAD  Echocardiogram showed ejection fraction 30%. She was given diuretics with subsequent acute renal failure.  creatinine increased from 1.15 up to 2.58. BUN increased from 28 to 47 . Diuretics were held, fluids initiated with improvement of her renal function back to her baseline . readmitted to the hospital 10/06/2013 for hypotension felt secondary to medications. Systolic pressure was 80. She was given fluids, Lasix was held temporarily. Hold parameters placed on her medications .  On her last clinic visit, she had malaise, unsteady gait, not feeling well. She was hypotensive, tachycardic Urine culture was performed and she was initially treated with Bactrim for 3 days. She continued to have symptoms and urine culture showed sensitivity to ciprofloxacin. She was treated with Cipro for 5 days for Enterobacter cloacae. This is the same bug that she had previously treated with Levaquin  In followup today, she and her daughter reports that she is doing well, back to her baseline. Confusion has resolved, balance is normal Blood pressure at [facility] is 097 up to 353 systolic Weight has been ranging 127 up to 129 pounds. She has not been receiving Lasix which has been ordered for weight over 130 pounds She denies any shortness of breath or leg edema  Echocardiogram from the hospital 10/01/2013 shows ejection fraction 30-35%, mention of possible anterior wall hypokinesis. This was not noted on cardiac catheterization. Repeat echocardiogram 02/23/2014 shows ejection  fraction 25-30%, mildly elevated right ventricular systolic pressures, mild aortic valve stenosi for elevated cardiac enzymes     Outpatient Encounter Prescriptions as of 09/27/2014  Medication Sig  . acetaminophen (TYLENOL) 500 MG tablet Take by mouth. 1 tab every 6 hrs prn  . ALPRAZolam (XANAX) 0.25 MG tablet Take 1/2 to 1 tablet three times a day prn for anxiety.  Marland Kitchen aspirin EC 81 MG tablet Take 81 mg by mouth daily.  . Calcium Carbonate-Vitamin D (CALTRATE 600+D PO) Take 2 tablets by mouth daily.  . carvedilol (COREG) 3.125 MG tablet Take 1 tablet (3.125 mg total) by mouth 2 (two) times daily with a meal.  . esomeprazole (NEXIUM) 40 MG capsule TAKE 1 CAPSULE (40 MG TOTAL) BY MOUTH DAILY BEFORE BREAKFAST.  . furosemide (LASIX) 20 MG tablet Take 1 tablet (20 mg total) by mouth daily.  Marland Kitchen lisinopril (PRINIVIL,ZESTRIL) 2.5 MG tablet Take 1 tablet (2.5 mg total) by mouth daily.  . NON FORMULARY Oxygen 2 liters at bedtimes.  . Pancrelipase, Lip-Prot-Amyl, 3000-10000 UNITS CPEP Take by mouth 3 (three) times daily.  Vladimir Faster Glycol-Propyl Glycol (SYSTANE) 0.4-0.3 % GEL Apply to eye. 1-2 drops in each eye BID prn  . polyethylene glycol powder (GLYCOLAX/MIRALAX) powder Take 17 g by mouth daily as needed.  . potassium chloride (K-DUR) 10 MEQ tablet Take 1 tablet (10 mEq total) by mouth daily.  . predniSONE (DELTASONE) 10 MG tablet Take 1 tablet (10 mg total) by mouth daily with breakfast. Alternate 10 mg and 5 mg every other day  . predniSONE (DELTASONE) 5 MG tablet TAKE 1 TABLET BY MOUTH EVERY OTHER DAY  . sertraline (ZOLOFT) 25 MG tablet Take 1 tablet (25 mg  total) by mouth daily. Take at dinner time  . sodium chloride (OCEAN) 0.65 % SOLN nasal spray Place 1 spray into both nostrils as needed for congestion.  . traMADol (ULTRAM) 50 MG tablet TAKE 1 TABLET BY MOUTH TWICE A DAY    Review of Systems  Constitutional: Negative.   HENT: Negative.   Eyes: Negative.   Respiratory: Positive for  shortness of breath.   Cardiovascular: Positive for leg swelling.  Gastrointestinal: Negative.   Endocrine: Negative.   Musculoskeletal: Positive for gait problem.  Skin: Negative.   Allergic/Immunologic: Negative.   Neurological: Negative.   Hematological: Negative.   Psychiatric/Behavioral: Negative.   All other systems reviewed and are negative.   BP 100/78  Pulse 84  Ht 4\' 11"  (1.499 m)  Wt 127 lb 12 oz (57.947 kg)  BMI 25.79 kg/m2  Physical Exam  Nursing note and vitals reviewed. Constitutional: She is oriented to person, place, and time. She appears well-developed and well-nourished.  Unstable gait  HENT:  Head: Normocephalic.  Nose: Nose normal.  Mouth/Throat: Oropharynx is clear and moist.  Eyes: Conjunctivae are normal. Pupils are equal, round, and reactive to light.  Neck: Normal range of motion. Neck supple. No JVD present.  Cardiovascular: Normal rate, regular rhythm, S1 normal, S2 normal, normal heart sounds and intact distal pulses.  Exam reveals no gallop and no friction rub.   No murmur heard. Trace lower extremity edema bilaterally  Pulmonary/Chest: Effort normal and breath sounds normal. No respiratory distress. She has no wheezes. She has no rales. She exhibits no tenderness.  Abdominal: Soft. Bowel sounds are normal. She exhibits no distension. There is no tenderness.  Musculoskeletal: Normal range of motion. She exhibits no edema and no tenderness.  Lymphadenopathy:    She has no cervical adenopathy.  Neurological: She is alert and oriented to person, place, and time. Coordination normal.  Skin: Skin is warm and dry. No rash noted. No erythema.  Psychiatric: She has a normal mood and affect. Her behavior is normal. Judgment and thought content normal.    Assessment and Plan

## 2014-09-27 NOTE — Assessment & Plan Note (Signed)
Recent culture growing Enterobacter cloacae. Previous hospital admission for similar pathogen. She currently feels well after 5 days of Cipro. Last urine culture showed possible contaminant

## 2014-09-27 NOTE — Patient Instructions (Addendum)
Your next appointment will be scheduled in our new office located at :  [facility]  [address], [place], [place] [address]   You are doing well. No medication changes were made.  Please call us if you have new issues that need to be addressed before your next appt.  Your physician wants you to follow-up in: 6 months.  You will receive a reminder letter in the mail two months in advance. If you don't receive a letter, please call our office to schedule the follow-up appointment.

## 2014-09-27 NOTE — Assessment & Plan Note (Signed)
Blood pressure 110 up to 120s per the notes from [facility]. No medication changes made

## 2014-09-27 NOTE — Assessment & Plan Note (Signed)
Weight has been stable at 127 up to 129 pounds without Megace

## 2014-09-27 NOTE — Assessment & Plan Note (Signed)
Appears euvolemic on today's visit. We'll only use Lasix for weight more than 130 pounds. She has not received Lasix in the past 2 weeks

## 2014-09-29 ENCOUNTER — Telehealth: Payer: Self-pay | Admitting: *Deleted

## 2014-09-29 NOTE — Telephone Encounter (Signed)
Nurse called with [facility] regarding. Patient is still taking Lisonpril 2.5 mg 1 daily.

## 2014-09-29 NOTE — Telephone Encounter (Signed)
Spoke w/ Leafy Ro, RN at [facility].  She states that pt was instructed to call back w/ her lisinopril dose.  Reports that pt is taking Lisinopril 2.5 mg once daily and pt is tolerating this well.

## 2014-09-30 ENCOUNTER — Encounter: Payer: Self-pay | Admitting: Internal Medicine

## 2014-09-30 DIAGNOSIS — R300 Dysuria: Secondary | ICD-10-CM | POA: Diagnosis not present

## 2014-10-03 ENCOUNTER — Encounter: Payer: Self-pay | Admitting: Internal Medicine

## 2014-10-17 DIAGNOSIS — I5022 Chronic systolic (congestive) heart failure: Secondary | ICD-10-CM | POA: Diagnosis not present

## 2014-10-19 ENCOUNTER — Ambulatory Visit: Payer: Medicare Other | Admitting: Family Medicine

## 2014-10-20 ENCOUNTER — Telehealth: Payer: Self-pay | Admitting: *Deleted

## 2014-10-20 NOTE — Telephone Encounter (Signed)
Left voicemail letting [name] know Dr. Marliss Coots comments/recommendations

## 2014-10-20 NOTE — Telephone Encounter (Signed)
[name] with [name] called to see if you received pt's labs from Monday and if there was any changes that you wanted to make, Leafy Ro just wanted to know your opinion of pt's results even if it's keep all meds the same they just need to know your feedback

## 2014-10-20 NOTE — Telephone Encounter (Signed)
It has not been scanned in to the chart yet- but I do not think we need to change anything  I think she has a f/u with me next week

## 2014-10-24 ENCOUNTER — Encounter: Payer: Self-pay | Admitting: Family Medicine

## 2014-10-24 ENCOUNTER — Ambulatory Visit (INDEPENDENT_AMBULATORY_CARE_PROVIDER_SITE_OTHER): Payer: Medicare Other | Admitting: Family Medicine

## 2014-10-24 VITALS — BP 116/78 | HR 82 | Temp 98.1°F | Ht 59.0 in | Wt 130.2 lb

## 2014-10-24 DIAGNOSIS — R413 Other amnesia: Secondary | ICD-10-CM

## 2014-10-24 DIAGNOSIS — R739 Hyperglycemia, unspecified: Secondary | ICD-10-CM

## 2014-10-24 DIAGNOSIS — R7401 Elevation of levels of liver transaminase levels: Secondary | ICD-10-CM | POA: Insufficient documentation

## 2014-10-24 DIAGNOSIS — E559 Vitamin D deficiency, unspecified: Secondary | ICD-10-CM

## 2014-10-24 DIAGNOSIS — R740 Nonspecific elevation of levels of transaminase and lactic acid dehydrogenase [LDH]: Secondary | ICD-10-CM

## 2014-10-24 NOTE — Assessment & Plan Note (Signed)
Lab Results  Component Value Date   HGBA1C 6.3 09/19/2014    No longer loosing wt Is eating more sugar and desserts now at [facility] cutting back on these-pt agrees and order put in for her residence Enc further exercise-which she enjoys Will watch this closely  ? If in any way related to prev pancreas issue   F/u 3 mo with lab prior

## 2014-10-24 NOTE — Assessment & Plan Note (Signed)
With resolution of uti and pneumonia -pt's mental status is better and more sharp Still has short term memory loss - per pt and family does fine at [facility] Urged her to continue being physically and mentally active and socializing Does not desire medication at this time

## 2014-10-24 NOTE — Assessment & Plan Note (Signed)
Continue to see elevated ast -fairly stable Lab Results  Component Value Date   ALT 29 09/19/2014   AST 108* 09/19/2014   ALKPHOS 93 09/19/2014   BILITOT 0.5 09/19/2014   Rev this  Disc prior biliary/pancreas issue  No symptoms or jaundice  Will continue to monitor

## 2014-10-24 NOTE — Progress Notes (Signed)
Pre visit review using our clinic review tool, if applicable. No additional management support is needed unless otherwise documented below in the visit note. 

## 2014-10-24 NOTE — Assessment & Plan Note (Signed)
Still getting 50,000 iu weekly at [facility] Will check level at next draw  Disc imp to bone and overall health

## 2014-10-24 NOTE — Progress Notes (Signed)
Subjective:    Patient ID: Bridget Walker, female    DOB: [birth year], 80 y.o.   MRN: [id number]  HPI Here for f/u of chronic and acute medical problems She remains in assisted living and doing well with that  Had a massage today - enjoys that at [facility] able to go to the dining room for meals  Has activities every day- is socially engaged    Saw cardiology re: CHF-agreed to diurese if over 130 lb  Overall stable     Chemistry      Component Value Date/Time   NA 135 09/19/2014 1449   NA 138 08/25/2014 1530   K 5.2* 09/19/2014 1449   CL 103 09/19/2014 1449   CO2 22 09/19/2014 1449   BUN 23 09/19/2014 1449   BUN 19 08/25/2014 1530   CREATININE 1.1 09/19/2014 1449   CREATININE 1.07 09/09/2014 1607      Component Value Date/Time   CALCIUM 9.5 09/19/2014 1449   ALKPHOS 93 09/19/2014 1449   AST 108* 09/19/2014 1449   ALT 29 09/19/2014 1449   BILITOT 0.5 09/19/2014 1449     labs are stable - K is the same  ua and ucx were both nl from 10/23  cxr showed atelectasis lower lungs-improved from prev    Addressed hyperglycemia at last visit  Lab Results  Component Value Date   HGBA1C 6.3 09/19/2014   this runs in family   Wt is up 3 lb with bmi of 26 No longer worried about weight loss  Appetite has been good  She does like ice cream - and eats it frequently  She has not asked for tylenol at all lately   Patient Active Problem List   Diagnosis Date Noted  . Elevated transaminase level 10/24/2014  . Hyperglycemia 09/19/2014  . History of fall 08/18/2014  . Encounter for Medicare annual wellness exam 06/15/2014  . Memory loss 06/15/2014  . Anxiety disorder 06/15/2014  . Orthostatic hypotension 03/10/2014  . Nocturnal hypoxemia 01/18/2014  . Chronic systolic CHF (congestive heart failure) 10/20/2013  . Compression fracture of spine 06/10/2013  . Back pain of thoracolumbar region 06/09/2013  . Mobility impaired 04/16/2013  . Frequent PVCs 03/28/2013  .  COPD with emphysema 03/28/2013  . Lung nodule, L lower lobe 10/15/2012  . Lobar pneumonia due to unspecified organism 06/11/2012  . Abnormal CT scan, gastrointestinal tract 03/24/2012  . Nonspecific (abnormal) findings on radiological and other examination of biliary tract 03/24/2012  . Fatigue 03/18/2012  . Jaundice 03/18/2012  . Loss of appetite 03/18/2012  . Anemia 11/29/2011  . TINNITUS 08/09/2010  . OVERACTIVE BLADDER 03/14/2010  . GERD 11/06/2009  . Vitamin D deficiency 10/31/2008  . DIVERTICULOSIS OF COLON 12/25/2007  . RESTLESS LEG SYNDROME 10/26/2007  . COLONIC POLYPS, HX OF 10/26/2007  . HYPERCHOLESTEROLEMIA 09/23/2007  . OSTEOARTHRITIS 09/23/2007  . OSTEOPOROSIS 09/23/2007   Past Medical History  Diagnosis Date  . Osteoporosis   . Hyperlipidemia   . RLS (restless legs syndrome)   . Vitamin D deficiency   . GERD (gastroesophageal reflux disease)   . Cerumen impaction     recurrent  . Unexplained weight loss   . Cough   . Constipation   . Diarrhea   . Shingles   . Overactive bladder   . Cancer     pancreatic  . Arthritis     OA, in hands  . CHF (congestive heart failure)   . Pancreatic insufficiency   .  Hiatal hernia    Past Surgical History  Procedure Laterality Date  . Cataract extraction      patient unsure of date procedure was done.  . Cataract extraction  11/08    2nd cataract  . Ercp  03/24/2012    Procedure: ENDOSCOPIC RETROGRADE CHOLANGIOPANCREATOGRAPHY (ERCP);  Surgeon: Milus Banister, MD;  Location: [facility];  Service: Endoscopy;  Laterality: N/A;  . Eus  04/02/2012    Procedure: ESOPHAGEAL ENDOSCOPIC ULTRASOUND (EUS) RADIAL;  Surgeon: Milus Banister, MD;  Location: [facility];  Service: Endoscopy;  Laterality: N/A;  EUS with FNA  . Fine needle aspiration  04/02/2012    Procedure: FINE NEEDLE ASPIRATION (FNA) LINEAR;  Surgeon: Milus Banister, MD;  Location: [facility];  Service: Endoscopy;  Laterality: N/A;  . Endoscopic retrograde  cholangiopancreatography (ercp) with propofol N/A 01/28/2013    Procedure: ENDOSCOPIC RETROGRADE CHOLANGIOPANCREATOGRAPHY (ERCP) WITH PROPOFOL;  Surgeon: Milus Banister, MD;  Location: [facility];  Service: Endoscopy;  Laterality: N/A;  . Cardiac catheterization  10/14    [facility];no stent   History  Substance Use Topics  . Smoking status: Former Smoker -- 0.25 packs/day for 20 years    Types: Cigarettes    Quit date: 03/23/1992  . Smokeless tobacco: Never Used     Comment: PT SMOKED 2 CIGARETTES A DAY   . Alcohol Use: No   Family History  Problem Relation Age of Onset  . Diabetes Sister   . Hypertension Sister   . Heart disease Brother   . Fibromyalgia Daughter   . Alcohol abuse Son   . Heart disease Brother   . Diabetes Brother   . Heart disease Mother   . Pneumonia Father   . Heart failure Father   . Cancer Other     niece had breast and bone cancer   Allergies  Allergen Reactions  . Lactose Intolerance (Gi)     Pt can drink skim milk  . Hydrocodone Rash    Patient unsure of reaction, but it may have been diarrhea.  . Penicillins Rash    On scalp   Current Outpatient Prescriptions on File Prior to Visit  Medication Sig Dispense Refill  . acetaminophen (TYLENOL) 500 MG tablet Take by mouth. 1 tab every 6 hrs prn    . ALPRAZolam (XANAX) 0.25 MG tablet Take 1/2 to 1 tablet three times a day prn for anxiety.    Marland Kitchen aspirin EC 81 MG tablet Take 81 mg by mouth daily.    . Calcium Carbonate-Vitamin D (CALTRATE 600+D PO) Take 2 tablets by mouth daily.    . carvedilol (COREG) 3.125 MG tablet Take 1 tablet (3.125 mg total) by mouth 2 (two) times daily with a meal. 180 tablet 3  . esomeprazole (NEXIUM) 40 MG capsule TAKE 1 CAPSULE (40 MG TOTAL) BY MOUTH DAILY BEFORE BREAKFAST. 90 capsule 3  . furosemide (LASIX) 20 MG tablet Take 20 mg by mouth daily. For weight greater than 130 pounds    . lisinopril (PRINIVIL,ZESTRIL) 2.5 MG tablet Take 1 tablet (2.5 mg total) by mouth daily. 90  tablet 3  . NON FORMULARY Oxygen 2 liters at bedtimes.    . Pancrelipase, Lip-Prot-Amyl, 3000-10000 UNITS CPEP Take by mouth 3 (three) times daily.    Vladimir Faster Glycol-Propyl Glycol (SYSTANE) 0.4-0.3 % GEL Apply to eye. 1-2 drops in each eye BID prn    . polyethylene glycol powder (GLYCOLAX/MIRALAX) powder Take 17 g by mouth daily as needed. 527 g 3  .  potassium chloride (K-DUR) 10 MEQ tablet Take 10 mEq by mouth daily. Only with lasix, for weight greater than 130 pounds    . predniSONE (DELTASONE) 10 MG tablet Take 1 tablet (10 mg total) by mouth daily with breakfast. Alternate 10 mg and 5 mg every other day 30 tablet 1  . predniSONE (DELTASONE) 5 MG tablet TAKE 1 TABLET BY MOUTH EVERY OTHER DAY 20 tablet 0  . sertraline (ZOLOFT) 25 MG tablet Take 1 tablet (25 mg total) by mouth daily. Take at dinner time 30 tablet 5  . sodium chloride (OCEAN) 0.65 % SOLN nasal spray Place 1 spray into both nostrils as needed for congestion.    . traMADol (ULTRAM) 50 MG tablet TAKE 1 TABLET BY MOUTH TWICE A DAY 20 tablet 0   No current facility-administered medications on file prior to visit.    Review of Systems Review of Systems  Constitutional: Negative for fever, appetite change, fatigue and unexpected weight change.  Eyes: Negative for pain and visual disturbance.  Respiratory: Negative for cough and shortness of breath.   Cardiovascular: Negative for cp or palpitations    Gastrointestinal: Negative for nausea, diarrhea and constipation.  Genitourinary: Negative for urgency and frequency.  Skin: Negative for pallor or rash   Neurological: Negative for weakness, light-headedness, numbness and headaches.  Hematological: Negative for adenopathy. Does not bruise/bleed easily.  Psychiatric/Behavioral: Negative for dysphoric mood. Pos for anxiety that is improved Pos for short term memory loss         Objective:   Physical Exam  Constitutional: She appears well-developed and well-nourished. No  distress.  Frail appearing elderly female   HENT:  Head: Normocephalic and atraumatic.  Mouth/Throat: Oropharynx is clear and moist. No oropharyngeal exudate.  Eyes: Conjunctivae and EOM are normal. Pupils are equal, round, and reactive to light. Right eye exhibits no discharge. Left eye exhibits no discharge. No scleral icterus.  Neck: Normal range of motion. Neck supple. No JVD present. Carotid bruit is not present. No thyromegaly present.  Cardiovascular: Normal rate, regular rhythm, normal heart sounds and intact distal pulses.   Pulmonary/Chest: Effort normal and breath sounds normal. No respiratory distress. She has no wheezes. She has no rales.  Abdominal: Soft. Bowel sounds are normal. She exhibits no distension and no mass. There is no tenderness.  Musculoskeletal: She exhibits no edema.  Kyphosis noted   Lymphadenopathy:    She has no cervical adenopathy.  Neurological: She is alert. She has normal reflexes. No cranial nerve deficit. She exhibits normal muscle tone. Coordination normal.  Skin: Skin is warm and dry. No rash noted. No pallor.  No jaundice   Psychiatric: She has a normal mood and affect. Cognition and memory are impaired. She exhibits abnormal recent memory.  Pleasant and talkative occ looses her train of thought           Assessment & Plan:   Problem List Items Addressed This Visit      Other   Elevated transaminase level    Continue to see elevated ast -fairly stable Lab Results  Component Value Date   ALT 29 09/19/2014   AST 108* 09/19/2014   ALKPHOS 93 09/19/2014   BILITOT 0.5 09/19/2014   Rev this  Disc prior biliary/pancreas issue  No symptoms or jaundice  Will continue to monitor     Relevant Orders      Comprehensive metabolic panel   Hyperglycemia - Primary    Lab Results  Component Value Date   HGBA1C 6.3  09/19/2014    No longer loosing wt Is eating more sugar and desserts now at [facility] cutting back on these-pt agrees  and order put in for her residence Enc further exercise-which she enjoys Will watch this closely  ? If in any way related to prev pancreas issue   F/u 3 mo with lab prior     Relevant Orders      Hemoglobin A1c   Memory loss    With resolution of uti and pneumonia -pt's mental status is better and more sharp Still has short term memory loss - per pt and family does fine at asst living Urged her to continue being physically and mentally active and socializing Does not desire medication at this time     Vitamin D deficiency    Still getting 50,000 iu weekly at [facility] Will check level at next draw  Disc imp to bone and overall health    Relevant Orders      Vit D  25 hydroxy (rtn osteoporosis monitoring)

## 2014-10-24 NOTE — Patient Instructions (Addendum)
I'm glad you are doing better  No change in medicines  Your glucose (sugar) is borderline high - not diabetes but borderline  Watch out for sugar and sweets (stay away from ice cream)  Your liver function test is still elevated -we will watch this  For this reason - I am telling the folks at [facility] to NOT give you tylenol   Follow up in 3 months please with labs prior

## 2014-10-25 ENCOUNTER — Encounter: Payer: Self-pay | Admitting: Family Medicine

## 2014-10-27 NOTE — Telephone Encounter (Signed)
This encounter was created in error - please disregard.

## 2014-10-31 ENCOUNTER — Telehealth: Payer: Self-pay | Admitting: *Deleted

## 2014-10-31 NOTE — Telephone Encounter (Signed)
Faxed your instructions to clarify meds last week.  Bridget Walker states that they did not receive this fax and are asking for further clarification, as their CNAs have a difficult time administering prn meds and need specific instructions.  Please advise.  Thank you.

## 2014-10-31 NOTE — Telephone Encounter (Signed)
Please call nurse at [facility] to clarify orders on Lasix and Potassium.

## 2014-10-31 NOTE — Telephone Encounter (Signed)
Left message for Bridget Walker to call back.

## 2014-10-31 NOTE — Telephone Encounter (Signed)
Lasix 20 mg daily only if weight greater 130 pounds. Potassium 10 meq daily if weight greater than 130 pounds.  It is extremely important to our patient's health that they get this correct. Not sure how else to word this as above. Please let me know if this does not make sense. Perhaps look into some QA at the nursing home.

## 2014-11-01 NOTE — Telephone Encounter (Signed)
Left message for Bridget Walker to call back.

## 2014-11-07 NOTE — Telephone Encounter (Signed)
Left message for any nurse to call back.

## 2014-11-07 NOTE — Telephone Encounter (Signed)
Spoke w/ Katie.  Clarified instructions w/ her.  She reports that they have been administering meds the correct way.

## 2014-11-09 ENCOUNTER — Ambulatory Visit (INDEPENDENT_AMBULATORY_CARE_PROVIDER_SITE_OTHER): Payer: Medicare Other | Admitting: Internal Medicine

## 2014-11-09 ENCOUNTER — Encounter: Payer: Self-pay | Admitting: Internal Medicine

## 2014-11-09 VITALS — BP 116/64 | HR 73 | Temp 97.5°F | Wt 127.0 lb

## 2014-11-09 DIAGNOSIS — R5383 Other fatigue: Secondary | ICD-10-CM | POA: Diagnosis not present

## 2014-11-09 DIAGNOSIS — H6122 Impacted cerumen, left ear: Secondary | ICD-10-CM | POA: Diagnosis not present

## 2014-11-09 DIAGNOSIS — R531 Weakness: Secondary | ICD-10-CM | POA: Diagnosis not present

## 2014-11-09 DIAGNOSIS — N390 Urinary tract infection, site not specified: Secondary | ICD-10-CM

## 2014-11-09 LAB — POCT URINALYSIS DIPSTICK
Bilirubin, UA: NEGATIVE
Glucose, UA: NEGATIVE
Ketones, UA: NEGATIVE
NITRITE UA: POSITIVE
PROTEIN UA: NEGATIVE
Urobilinogen, UA: NEGATIVE
pH, UA: 5.5

## 2014-11-09 MED ORDER — CIPROFLOXACIN HCL 250 MG PO TABS: 250.0000 mg | ORAL_TABLET | Freq: Two times a day (BID) | ORAL | Status: DC

## 2014-11-09 NOTE — Patient Instructions (Signed)

## 2014-11-09 NOTE — Progress Notes (Signed)
HPI   Pt presents to the clinic today with c/o weakness. She has noticed this more over the last few days. She denies any specific complaints but just reports that she does not feel good. She has not had any recent changes in medications. She denies URI symptoms, cough, fever, chills or urinary symptoms. She does report left ear fullness.  Review of Systems   Past Medical History   Diagnosis  Date   .  Osteoporosis    .  Hyperlipidemia    .  RLS (restless legs syndrome)    .  Vitamin D deficiency    .  GERD (gastroesophageal reflux disease)    .  Cerumen impaction      recurrent   .  Unexplained weight loss    .  Cough    .  Constipation    .  Diarrhea    .  Shingles    .  Overactive bladder    .  Cancer      pancreatic   .  Arthritis      OA, in hands   .  CHF (congestive heart failure)    .  Pancreatic insufficiency    .  Hiatal hernia     Current Outpatient Prescriptions   Medication  Sig  Dispense  Refill   .  acetaminophen (TYLENOL) 500 MG tablet  Take by mouth. 1 tab every 6 hrs prn     .  ALPRAZolam (XANAX) 0.25 MG tablet  Take 1/2 to 1 tablet three times a day prn for anxiety.     Marland Kitchen  aspirin EC 81 MG tablet  Take 81 mg by mouth daily.     .  Calcium Carbonate-Vitamin D (CALTRATE 600+D PO)  Take 2 tablets by mouth daily.     .  carvedilol (COREG) 3.125 MG tablet  Take 1 tablet (3.125 mg total) by mouth 2 (two) times daily with a meal.  180 tablet  3   .  ergocalciferol (VITAMIN D2) 50000 UNITS capsule  Take 50,000 Units by mouth once a week.     .  esomeprazole (NEXIUM) 40 MG capsule  TAKE 1 CAPSULE (40 MG TOTAL) BY MOUTH DAILY BEFORE BREAKFAST.  90 capsule  3   .  furosemide (LASIX) 20 MG tablet  Take 20 mg by mouth daily. For weight greater than 130 pounds     .  lisinopril (PRINIVIL,ZESTRIL) 2.5 MG tablet  Take 1 tablet (2.5 mg total) by mouth daily.  90 tablet  3   .  NON FORMULARY  Oxygen 2 liters at bedtimes.     .  Pancrelipase, Lip-Prot-Amyl, 3000-10000 UNITS  CPEP  Take by mouth 3 (three) times daily.     Vladimir Faster Glycol-Propyl Glycol (SYSTANE) 0.4-0.3 % GEL  Apply to eye. 1-2 drops in each eye BID prn     .  polyethylene glycol powder (GLYCOLAX/MIRALAX) powder  Take 17 g by mouth daily as needed.  527 g  3   .  potassium chloride (K-DUR) 10 MEQ tablet  Take 10 mEq by mouth daily. Only with lasix, for weight greater than 130 pounds     .  predniSONE (DELTASONE) 10 MG tablet  Take 1 tablet (10 mg total) by mouth daily with breakfast. Alternate 10 mg and 5 mg every other day  30 tablet  1   .  predniSONE (DELTASONE) 5 MG tablet  TAKE 1 TABLET BY MOUTH EVERY OTHER DAY  20 tablet  0   .  sertraline (ZOLOFT) 25 MG tablet  Take 1 tablet (25 mg total) by mouth daily. Take at dinner time  30 tablet  5   .  sodium chloride (OCEAN) 0.65 % SOLN nasal spray  Place 1 spray into both nostrils as needed for congestion.     .  traMADol (ULTRAM) 50 MG tablet  TAKE 1 TABLET BY MOUTH TWICE A DAY  20 tablet  0    No current facility-administered medications for this visit.    Allergies   Allergen  Reactions   .  Lactose Intolerance (Gi)      Pt can drink skim milk   .  Hydrocodone  Rash     Patient unsure of reaction, but it may have been diarrhea.   .  Penicillins  Rash     On scalp    Family History   Problem  Relation  Age of Onset   .  Diabetes  Sister    .  Hypertension  Sister    .  Heart disease  Brother    .  Fibromyalgia  Daughter    .  Alcohol abuse  Son    .  Heart disease  Brother    .  Diabetes  Brother    .  Heart disease  Mother    .  Pneumonia  Father    .  Heart failure  Father    .  Cancer  Other      niece had breast and bone cancer    History    Social History   .  Marital Status:  Widowed     Spouse Name:  N/A     Number of Children:  3   .  Years of Education:  N/A    Occupational History   .       RETIRED    Social History Main Topics   .  Smoking status:  Former Smoker -- 0.25 packs/day for 20 years     Types:   Cigarettes     Quit date:  03/23/1992   .  Smokeless tobacco:  Never Used      Comment: PT SMOKED 2 CIGARETTES A DAY   .  Alcohol Use:  No   .  Drug Use:  No   .  Sexual Activity:  No      Comment: not applicable    Other Topics  Concern   .  Not on file    Social History Narrative    Resides at [facility] in [place] ([place])    Widow    Constitutional: Pt reports malaise. Denies fever, fatigue, headache or abrupt weight changes.  HEENT: Pt reports left ear fullness. Denies eye pain, eye redness, ear pain, ringing in the ears, wax buildup, runny nose, nasal congestion, bloody nose, or sore throat.  Respiratory: Denies difficulty breathing, shortness of breath, cough or sputum production.  Cardiovascular: Denies chest pain, chest tightness, palpitations or swelling in the hands or feet.  Gastrointestinal: Denies abdominal pain, bloating, constipation, diarrhea or blood in the stool.  GU: Denies urgency, frequency, pain with urination, burning sensation, blood in urine, odor or discharge.   Objective:   Physical Exam   BP 116/64 mmHg  Pulse 73  Temp(Src) 97.5 F (36.4 C) (Oral)  Wt 127 lb (57.607 kg)  SpO2 96%  Wt Readings from Last 3 Encounters:   11/09/14  127 lb (57.607 kg)   10/24/14  130 lb 4 oz (59.081  kg)   09/27/14  127 lb 12 oz (57.947 kg)    General: Appears her stated age, well developed, well nourished in NAD.  HEENT: Head: normal shape and size; Right Ears: Tm's gray and intact, normal light reflex, Left Ear: Cerumen impaction; Nose: mucosa pink and moist, septum midline; Throat/Mouth: Teeth present, mucosa pink and moist, no exudate, lesions or ulcerations noted.  Cardiovascular: Normal rate and rhythm. S1,S2 noted. ? S3. Pulmonary/Chest: Normal effort and positive vesicular breath sounds. No respiratory distress. No wheezes, rales or ronchi noted.  Abdomen: Soft and nontender. Normal bowel sounds, no bruits noted. No CVA  tenderness.   BMET   Labs (Brief)      Component  Value  Date/Time     NA  135  09/19/2014 1449     NA  138  08/25/2014 1530     K  5.2*  09/19/2014 1449     CL  103  09/19/2014 1449     CO2  22  09/19/2014 1449     GLUCOSE  166*  09/19/2014 1449     GLUCOSE  153*  08/25/2014 1530     BUN  23  09/19/2014 1449     BUN  19  08/25/2014 1530     CREATININE  1.1  09/19/2014 1449     CREATININE  1.07  09/09/2014 1607     CALCIUM  9.5  09/19/2014 1449     GFRNONAA  56*  08/25/2014 1530     GFRAA  65  08/25/2014 1530    Lipid Panel   Labs (Brief)      Component  Value  Date/Time     CHOL  186  06/15/2014 1701     TRIG  83.0  06/15/2014 1701     HDL  46.50  06/15/2014 1701     CHOLHDL  4  06/15/2014 1701     VLDL  16.6  06/15/2014 1701     LDLCALC  123*  06/15/2014 1701    CBC   Labs (Brief)      Component  Value  Date/Time     WBC  7.5  09/09/2014 1607     WBC  10.7  08/25/2014 1530     RBC  3.91  09/09/2014 1607     RBC  3.68*  08/25/2014 1530     HGB  11.7*  09/09/2014 1607     HCT  36.1  09/09/2014 1607     PLT  413*  09/09/2014 1607     MCV  92.3  09/09/2014 1607     MCH  29.9  09/09/2014 1607     MCH  31.0  08/25/2014 1530     MCHC  32.4  09/09/2014 1607     MCHC  32.8  08/25/2014 1530     RDW  14.2  09/09/2014 1607     RDW  14.1  08/25/2014 1530     LYMPHSABS  1.2  09/09/2014 1607     LYMPHSABS  1.3  08/25/2014 1530     MONOABS  0.3  09/09/2014 1607     EOSABS  0.0  09/09/2014 1607     EOSABS  0.0  08/25/2014 1530     BASOSABS  0.0  09/09/2014 1607     BASOSABS  0.0  08/25/2014 1530    Hgb A1C   Recent Labs   Lab Results    Component  Value  Date     HGBA1C  6.3  09/19/2014     Assessment & Plan:   Weakness:   Urinalysis: 1+ leuks, trace blood, pos nitrites eRx for Cipro 250 mg BID x 5 days Push fluids Ok to say AZO OTC  Left ear cerumen impaction:  Wax removed using cerumen spoon by provider  RTC as needed or if symptoms persist or  worsen

## 2014-11-09 NOTE — Progress Notes (Signed)
Pre visit review using our clinic review tool, if applicable. No additional management support is needed unless otherwise documented below in the visit note. 

## 2014-11-10 ENCOUNTER — Ambulatory Visit: Payer: Medicare Other | Admitting: Internal Medicine

## 2014-11-11 ENCOUNTER — Ambulatory Visit: Payer: Medicare Other | Admitting: Family Medicine

## 2014-11-12 LAB — URINE CULTURE: Colony Count: >=100000

## 2014-11-16 ENCOUNTER — Telehealth: Payer: Self-pay | Admitting: Cardiovascular Disease

## 2014-11-16 NOTE — Telephone Encounter (Signed)
Nurse from [facility] was told to call dr.Gollan if any patients had any problems to call.  Pt has had a bp of 90/60. And this started last night.  nausea and a lot of weakness, dizziness when standing.

## 2014-11-17 NOTE — Telephone Encounter (Signed)
Spoke w/ Leafy Ro, RN w/ [facility].   She reports that pt only takes Lasix when wt is >130, which has not been for some time.  Wt today 123 lbs.  Pt does not have antinausea meds, but she denies nausea today. Leafy Ro reports that she has been pushing fluids, as pt does not drink much throughout the day, BP last night was 108/60. BP this am 100/58. She reports that pt feels stronger today, went to and participated in activities.  Reports pt took Cipro 250 mg x 5 days, took last dose on Monday.  Pt does not present any more sx of UTI, but she states they can perform u/a if needed.

## 2014-11-17 NOTE — Telephone Encounter (Signed)
1. I am assuming she is still not receiving her Lasix as last I heard she had not received this in quite some time. 2. Sounds like she is not getting enough fluids in. -have her increase PO intake -if the nausea is persisting does she have anything for this? -she was recently seen for UTI, are there any other symptoms?

## 2014-11-17 NOTE — Telephone Encounter (Signed)
-  Would leave decision of UA up to primary team. -Sounds like she is feeling better. -Would continue conservative care and ask that she increase PO fluids some during the day to help out

## 2014-11-18 NOTE — Telephone Encounter (Signed)
Spoke w/ Leafy Ro, RN w/ [facility]. Advised her of Thurmond Butts Dunn's recommendation.  She verbalizes understanding and will call back w/ any questions or concerns.

## 2014-11-24 ENCOUNTER — Other Ambulatory Visit: Payer: Self-pay | Admitting: Internal Medicine

## 2014-11-25 ENCOUNTER — Telehealth: Payer: Self-pay | Admitting: *Deleted

## 2014-11-25 MED ORDER — TRAMADOL HCL 50 MG PO TABS: 50.0000 mg | ORAL_TABLET | Freq: Two times a day (BID) | ORAL | Status: DC

## 2014-11-25 NOTE — Telephone Encounter (Signed)
Fax refill request, since it's a mail order they require a paper Rx faxed to them (fax # [phone])

## 2014-11-25 NOTE — Telephone Encounter (Signed)
Printed to fax  I'm not sure how often she takes it - I wrote for 90- if she needs more than that let me know

## 2014-11-25 NOTE — Telephone Encounter (Signed)
Rx faxed to pharmacy  

## 2014-12-15 ENCOUNTER — Telehealth: Payer: Self-pay | Admitting: Family Medicine

## 2014-12-15 MED ORDER — TRAMADOL HCL 50 MG PO TABS: 50.0000 mg | ORAL_TABLET | Freq: Three times a day (TID) | ORAL | Status: DC | PRN

## 2014-12-15 NOTE — Addendum Note (Signed)
Addended by: Loura Pardon A on: 12/15/2014 12:17 PM   Modules accepted: Orders

## 2014-12-15 NOTE — Telephone Encounter (Signed)
Rx called in as prescribed and left voicemail with [facility] letting them know Rx was sent

## 2014-12-15 NOTE — Telephone Encounter (Signed)
Px written for call in   

## 2014-12-15 NOTE — Telephone Encounter (Signed)
Bridget Walker is doing patient's pill boxes today and is asking for Tramadol 50mg  BID called in to [facility].  Patient uses mail order, but her prescriptions haven't arrived.  Patient needs prescription called in today because she'll run out tonight.

## 2014-12-19 ENCOUNTER — Ambulatory Visit (INDEPENDENT_AMBULATORY_CARE_PROVIDER_SITE_OTHER): Payer: Medicare Other | Admitting: Internal Medicine

## 2014-12-19 ENCOUNTER — Encounter: Payer: Self-pay | Admitting: Internal Medicine

## 2014-12-19 VITALS — BP 118/62 | HR 84 | Ht 60.0 in | Wt 123.0 lb

## 2014-12-19 DIAGNOSIS — J432 Centrilobular emphysema: Secondary | ICD-10-CM | POA: Diagnosis not present

## 2014-12-19 MED ORDER — PREDNISONE 5 MG PO TABS: ORAL_TABLET | ORAL | Status: DC

## 2014-12-19 MED ORDER — PREDNISONE 10 MG PO TABS: ORAL_TABLET | ORAL | Status: DC

## 2014-12-19 NOTE — Progress Notes (Signed)
06/08/12- 20 yoF former smoker  referred by Dr Benay Spice. Pt states having Increase SOB upon activity Productive cough in am . PCP Dr Loura Pardon    Son here She is being monitored for possible pancreatic tumor and has history of a biliary stent. For about 3 months she has been aware of dyspnea on exertion while walking, relieved by sitting. She had a "mild outpatient pneumonia" treated 2 weeks ago. She had been exercising at the [facility]. She was noting more shortness of breath with exertion after bad cold and some shingles. Weight had been going down. She has been drinking diet supplements to gain weight. She denies cough, wheeze, chest pain. She does hear a rattle but coughs out nothing. Legs feel weak. She denies fever, blood or swollen glands. She denies past history of asthma or pneumonia and she denies heart disease, reflux, choking with meals. She is widowed, living in a retirement center. 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 Father died with pneumonia, mother died with MI. CT chest was concerning for density, more than a "mild pneumonia". CT chest 05/07/12- reviewed with her IMPRESSION:  No evidence of significant pulmonary embolus. The extensive  consolidation of the right lower lung and right middle lung with  patchy areas of nodular infiltration in the left lower lung.  Changes may represent pneumonia. No definite evidence of a central  obstructing lesion although the distal right main stem bronchus is  somewhat narrowed and a cold mass may be obscured by the  consolidation. Recommend follow up after resolution of acute  symptoms. Large esophageal hiatal hernia with mild esophageal  dilatation. Esophagitis versus dysmotility. Biliary wall stent  and biliary gas demonstrated in the upper abdomen.  Original Report Authenticated By: Neale Burly, M.D.   CXR 06/03/12- IMPRESSION:  1. Multi focal  right lung airspace disease has not significantly  changed since 05/07/2012.  2. Interval increased patchy opacity at the left lung base.  Original Report Authenticated By: Randall An, M.D.   07/07/12- 56 yoF former smoker followed for pneumonia complicated by large hiatal hernia, pancreatic cancer. Dr Sherrill/ Onc> Hospice. Daughter and son here today. She is now followed by hospice because of pancreatic cancer with biliary stent. Notices dyspnea standing, and cough as she gets out of bed and starts moving. Mostly clear phlegm, occasional scant green. Wakes herself coughing. Avelox did not help. We reviewed the CT scan images again, nothing large hiatal hernia full of food and parenchymal consolidation consistent with pneumonia and especially consistent with aspiration pneumonia. Discussed possibility of organizing pneumonia.  07/27/12-  55 yoF former smoker followed for pneumonia complicated by large hiatal hernia, pancreatic cancer.  Dtr here. Sputum CX 06/10/12- Nl flora, neg for AFB. Dr Ardis Hughs has indicated steroid trial, aimed at organizing pneumonia, is ok from pancreas standpoint.  Very short of breath after shower. Home oxygen helps, provided by Hospice through? [facility]. No pain, fever, sweat. Scant clear mucus. Flutter device helps shortness of breath after use, but does not generate productive cough. She denies any choking or strangling with meals or during sleep. CXR I 07/27/12- images reviewed with them. IMPRESSION:  1. Worsening asymmetric airspace disease involving right lung  greater than left, suspicious for pneumonia.  2. Stable mild cardiomegaly.  3. Hiatal hernia again noted.  Original Report Authenticated By: Marlaine Hind, M.D.   08/13/12- 22 yoF former smoker followed for  pneumonia complicated by large hiatal hernia, pancreatic cancer.  Dtr here.  Pt reports legs no longer hurt, no more coughing in the morning, wearing O2 most of the time--not today. Has continued  maintenance prednisone 20 mg daily and recognizes breathing is better but it gives her some insomnia. Has elevated head of bed. Using oxygen at 2 L only as needed now. Not coughing at all. Wants flu vax CXR 08/13/12- IMPRESSION:  Improved right lower lobe and left lung base infiltrates.  Underlying COPD. No new abnormalities.   10/02/12- 72 yoF former smoker followed for pneumonia complicated by large hiatal hernia, pancreatic cancer.  Dtr here. Follow-Up  - has been feeling very anxious lately. Hospice nurse thinks that she needs something for anxiety. Prednisone seemed to reduce the amount of parenchymal density, consistent with impression that it was organizing pneumonia. She is followed with a hospice nurse. Reducing prednisone to 5 mg daily left her too short of breath, so  she went back to 10 mg daily. Less morning cough. Recent night sweats. Low back pain sometimes radiates to abdomen. CXR 10/02/12- we reviewed the images together IMPRESSION:  Improved airspace disease in the right mid and lower lung zones.  Persistent opacity is noted  Stable spiculated opacity at the left lung base. Underlying  malignancy cannot be excluded.  Original Report Authenticated By: Jamas Lav, M.D.   03/22/13- 50 yoF former smoker followed for pneumonia complicated by large hiatal hernia.  Dtr here. FOLLOWS FOR: breathing doing good as long as wearing O2; daughter states that pt is still breathy; hospice is pulling out as pt does not have pancreatic cancer; will need order for O2 through DME company since hospice is no longer going to help with this.  Has been using prednisone 10 mg alternating with 5 mg every other day. Makes her anxious. Mentions pains and right rib cage that seem to come and go.  05/17/13- 65 yoF former smoker followed for Hx pneumonia, COPD/E,  complicated by large hiatal hernia.  Dtr here. FOLLOWS FOR: continues to use O2 QHS. Breathing is doing okay O2 2-4L/ [facility], just for  sleep and as needed, but she doesn't think she needs much. Moving to assisted living. Per the right side of her back exercising, helped by physical therapy Prednisone now reduced to 5 mg daily. CXR 03/23/13-Stable spiculated opacity at the left lung base. Large hiatal hernia. IMPRESSION:  No acute abnormality is noted.  Original Report Authenticated By: Inez Catalina, M.D  08/19/13- 33 yoF former smoker followed for Hx pneumonia, COPD/E, lung nodule,  complicated by large hiatal hernia.  Dtr here FOLLOWS FOR: unsure if needs to go back to O2; uses her flutter device and feels that  helps some; had called about SOB and had prednisone increased. Cut off oxygen  Then tapered prednisone. Started getting more short of breath so she increased the prednisone back to 10 mg daily for the past month. Occasional dyspnea on exertion in assisted living but now feels comfortable. //needs f/u CXR for lung nodule//  11/25/13 Follow up COPD  Has to re- qualify for O2 -- did not qualify today here in the office.  No desats with walking.  Says overall breathing is the same . No flare in cough, wheezing or dyspnea. Gets winded easily.  On prednisone 5mg  alterating with 10mg  daily   01/06/14- 88 yoF former smoker followed for Hx pneumonia, COPD/E, lung nodule,  complicated by large hiatal hernia.  Dtr here FOLLOWS FOR:  Still  having sob with exertion-- discuss ONO results Diagnosed CHF in October. Hospitalized in October for easy dyspnea on exertion. Better now. Little cough. [place] 01/01/14- qualified for sleep oxygen  05/05/14- 88 yoF former smoker followed for Hx pneumonia, COPD/E, lung nodule,  complicated by large hiatal hernia.  Dtr here FOLLOWS FOR: Pt having SOB still-gets worse as the day goes on; using O2 QHS only. O2 2L sleep/ APS O2 helps sleep. Cardiology notes her large hiatal hernia after meals crowds her breathing. By end of day she is tired, chest feels full after supper-more short of breath then. No  chest pain or cough noted.   12/19/14- 12 yoF former smoker followed for Hx pneumonia, COPD/E, lung nodule,  complicated by large hiatal hernia.  Dtr here O2 2L sleep/ APS FOLLOWS FOR: Walking inside due to cold weather-able to maintain(uses rolling walker to assist in this). Pt states she has SOB.  Lives at assisted living. No acute complaints. Needs prednisone refilled-discussed CXR report 09/26/14 Hiatal hernia, Cardiac enlargement, bilateral lower zone atelectasis.   ROS-see HPI Constitutional:   No-  weight loss, night sweats, fevers, chills, fatigue, lassitude. HEENT:   No-  headaches, difficulty swallowing, tooth/dental problems, sore throat,       No-  sneezing, itching, ear ache, nasal congestion, post nasal drip,  CV:  No-   chest pain, orthopnea, PND, swelling in lower extremities, anasarca,dizziness, palpitations Resp: +shortness of breath with exertion or at rest.              No- productive cough,  No non-productive cough,  No- coughing up of blood.              No-   change in color of mucus.  No- wheezing.   Skin: No-   rash or lesions. GI:  No-   heartburn, indigestion, abdominal pain, nausea, vomiting,  GU:  MS:  No-   joint pain or swelling.  +back pain Neuro-     nothing unusual Psych:  No- change in mood or affect.  depression or +anxiety.  No memory loss.  OBJ- Physical Exam General- Alert, Oriented, Affect-appropriate, Distress- none acute, thin elderly woman Skin- rash-none, lesions- none, excoriation- none Lymphadenopathy- none Head- atraumatic            Eyes- Gross vision intact, PERRLA, conjunctivae and secretions clear            Ears- Hearing, canals-normal            Nose- Clear, no-Septal dev, mucus, polyps, erosion, perforation             Throat- Mallampati II , mucosa clear , drainage- none, tonsils- atrophic. Own teeth Neck- flexible , trachea midline, no stridor , thyroid nl, carotid no bruit Chest - symmetrical excursion , unlabored            Heart/CV- RRR/few extra beats , no murmur , no gallop  , no rub, nl s1 s2                           - JVD- none , edema- none, stasis changes- none, varices- none           Lung- +diminished, +few crackles, unlabored, rub- none, no cough, wheeze+trace           Chest wall-  Abd-  Br/ Gen/ Rectal- Not done, not indicated Extrem- cyanosis- none, clubbing, none, atrophy- none, strength- nl, using a walker Neuro- grossly intact  to observation

## 2014-12-19 NOTE — Patient Instructions (Signed)
Refill scripts sent for both prednisone tablet strengths  Ok to continue O2 2L/ APS for sleep  Please call as needed

## 2014-12-21 ENCOUNTER — Other Ambulatory Visit: Payer: Self-pay | Admitting: Family Medicine

## 2014-12-22 ENCOUNTER — Other Ambulatory Visit: Payer: Self-pay | Admitting: *Deleted

## 2014-12-22 ENCOUNTER — Other Ambulatory Visit: Payer: Self-pay | Admitting: Family Medicine

## 2014-12-22 NOTE — Telephone Encounter (Signed)
Spoke with pt's daughter Bridget Walker and when pt was in [facility] they were only giving her the tramadol as she requested it, but since pt is back at her assisted living facility she has been getting her tramadol on a schedule (twice a day) so the Rx we sent in dec is almost out, and pt has about 3-4 pills left.  Daughter is requesting a short term Rx for tramadol sent to the [facility], and then a [facility] but [facility] pt's daughter is requesting #180 be sent so it will last the full 3 months now that pt is back at her normal residence (the [facility] Rx has to be a paper Rx faxed to them)

## 2014-12-22 NOTE — Telephone Encounter (Signed)
Addressed through another phone note 

## 2014-12-22 NOTE — Telephone Encounter (Signed)
Bridget Walker left v/m; Bridget Walker is going to local pharmacy every 1 1/2 weeks and Bridget Walker request cb about pt getting larger quantity; pt taking med twice daily. Did this refill get sent to mail order. Bridget Walker request cb [phone].

## 2014-12-23 MED ORDER — TRAMADOL HCL 50 MG PO TABS: 50.0000 mg | ORAL_TABLET | Freq: Two times a day (BID) | ORAL | Status: DC

## 2014-12-23 NOTE — Telephone Encounter (Signed)
Rx called into local pharmacy and printed Rx faxed to [facility]. Pt's daughter notified

## 2014-12-23 NOTE — Telephone Encounter (Signed)
Printed to fax Local for call in

## 2014-12-25 NOTE — Assessment & Plan Note (Signed)
Discussed how to avoid viral infections this winter. Her pattern is mostly emphysema now. Oxygen dependent during sleep.  Plan-medications reviewed, refill prescriptions sent

## 2014-12-29 ENCOUNTER — Other Ambulatory Visit: Payer: Self-pay | Admitting: Internal Medicine

## 2015-01-02 ENCOUNTER — Other Ambulatory Visit: Payer: Self-pay | Admitting: Family Medicine

## 2015-01-02 NOTE — Telephone Encounter (Signed)
3 month follow up schedule on 01/25/2015.

## 2015-01-02 NOTE — Telephone Encounter (Signed)
Please refill for 3 mo  

## 2015-01-16 DIAGNOSIS — H04123 Dry eye syndrome of bilateral lacrimal glands: Secondary | ICD-10-CM | POA: Diagnosis not present

## 2015-01-16 DIAGNOSIS — Z961 Presence of intraocular lens: Secondary | ICD-10-CM | POA: Diagnosis not present

## 2015-01-16 DIAGNOSIS — H43813 Vitreous degeneration, bilateral: Secondary | ICD-10-CM | POA: Diagnosis not present

## 2015-01-16 DIAGNOSIS — E119 Type 2 diabetes mellitus without complications: Secondary | ICD-10-CM | POA: Diagnosis not present

## 2015-01-16 LAB — HM DIABETES EYE EXAM

## 2015-01-18 ENCOUNTER — Other Ambulatory Visit (INDEPENDENT_AMBULATORY_CARE_PROVIDER_SITE_OTHER): Payer: Medicare Other

## 2015-01-18 DIAGNOSIS — R739 Hyperglycemia, unspecified: Secondary | ICD-10-CM

## 2015-01-18 DIAGNOSIS — E559 Vitamin D deficiency, unspecified: Secondary | ICD-10-CM

## 2015-01-18 DIAGNOSIS — R740 Nonspecific elevation of levels of transaminase and lactic acid dehydrogenase [LDH]: Secondary | ICD-10-CM | POA: Diagnosis not present

## 2015-01-18 DIAGNOSIS — R7401 Elevation of levels of liver transaminase levels: Secondary | ICD-10-CM

## 2015-01-18 LAB — COMPREHENSIVE METABOLIC PANEL
ALT: 15 U/L (ref 0–35)
AST: 39 U/L — ABNORMAL HIGH (ref 0–37)
Albumin: 4 g/dL (ref 3.5–5.2)
Alkaline Phosphatase: 57 U/L (ref 39–117)
BILIRUBIN TOTAL: 0.5 mg/dL (ref 0.2–1.2)
BUN: 20 mg/dL (ref 6–23)
CHLORIDE: 102 meq/L (ref 96–112)
CO2: 28 meq/L (ref 19–32)
Calcium: 10 mg/dL (ref 8.4–10.5)
Creatinine, Ser: 0.86 mg/dL (ref 0.40–1.20)
GFR: 66.26 mL/min (ref >60.00)
Glucose, Bld: 137 mg/dL — ABNORMAL HIGH (ref 70–99)
Potassium: 4.3 mEq/L (ref 3.5–5.1)
SODIUM: 137 meq/L (ref 135–145)
Total Protein: 7.4 g/dL (ref 6.0–8.3)

## 2015-01-18 LAB — HEMOGLOBIN A1C: HEMOGLOBIN A1C: 7 % — AB (ref 4.6–6.5)

## 2015-01-18 LAB — VITAMIN D 25 HYDROXY (VIT D DEFICIENCY, FRACTURES): VITD: 44.43 ng/mL (ref 30.00–100.00)

## 2015-01-24 ENCOUNTER — Ambulatory Visit: Payer: Medicare Other | Admitting: Family Medicine

## 2015-01-25 ENCOUNTER — Other Ambulatory Visit: Payer: Medicare Other

## 2015-01-25 ENCOUNTER — Encounter: Payer: Self-pay | Admitting: Family Medicine

## 2015-01-25 ENCOUNTER — Ambulatory Visit: Payer: Medicare Other | Admitting: Family Medicine

## 2015-02-01 ENCOUNTER — Ambulatory Visit (INDEPENDENT_AMBULATORY_CARE_PROVIDER_SITE_OTHER): Payer: Medicare Other | Admitting: Family Medicine

## 2015-02-01 ENCOUNTER — Telehealth: Payer: Self-pay | Admitting: Family Medicine

## 2015-02-01 ENCOUNTER — Encounter: Payer: Self-pay | Admitting: Family Medicine

## 2015-02-01 VITALS — BP 116/72 | HR 90 | Temp 97.2°F | Ht 59.0 in | Wt 122.1 lb

## 2015-02-01 DIAGNOSIS — F411 Generalized anxiety disorder: Secondary | ICD-10-CM

## 2015-02-01 DIAGNOSIS — E559 Vitamin D deficiency, unspecified: Secondary | ICD-10-CM | POA: Diagnosis not present

## 2015-02-01 DIAGNOSIS — E119 Type 2 diabetes mellitus without complications: Secondary | ICD-10-CM | POA: Diagnosis not present

## 2015-02-01 DIAGNOSIS — R7401 Elevation of levels of liver transaminase levels: Secondary | ICD-10-CM

## 2015-02-01 DIAGNOSIS — R740 Nonspecific elevation of levels of transaminase and lactic acid dehydrogenase [LDH]: Secondary | ICD-10-CM

## 2015-02-01 MED ORDER — SERTRALINE HCL 25 MG PO TABS: ORAL_TABLET | ORAL | Status: DC

## 2015-02-01 NOTE — Patient Instructions (Signed)
You have mild diabetes from the prednisone  Please try to avoid sweets/sugar drinks and juices and limit carbohydrates (bread/pasta/rice/potatoes)  Stop xanax  Continue zoloft and other medicines  Liver tests are better  Vit D level is better  Follow up in 3 months with labs prior

## 2015-02-01 NOTE — Progress Notes (Signed)
Subjective:    Patient ID: Bridget Walker, female    DOB: 26-Jan-1925, 81 y.o.   MRN: [id number]  HPI Here for f/u of chronic conditions   Wt is stable with bmi of 24   She has no complaints  Active and social - doing well at [facility] and she is doing a lot of art work  Memory is "pretty stable"   Hx of elevated liver function    Chemistry      Component Value Date/Time   NA 137 01/18/2015 1507   NA 138 08/25/2014 1530   K 4.3 01/18/2015 1507   CL 102 01/18/2015 1507   CO2 28 01/18/2015 1507   BUN 20 01/18/2015 1507   BUN 19 08/25/2014 1530   CREATININE 0.86 01/18/2015 1507   CREATININE 1.07 09/09/2014 1607      Component Value Date/Time   CALCIUM 10.0 01/18/2015 1507   ALKPHOS 57 01/18/2015 1507   AST 39* 01/18/2015 1507   ALT 15 01/18/2015 1507   BILITOT 0.5 01/18/2015 1507     no longer getting tylenol/ acetaminophen   Blood sugar is increased Lab Results  Component Value Date   HGBA1C 7.0* 01/18/2015   This is up from 6.3  She has to stay on prednisone (was not able to decrease it -breathing gets worse)   Wt is stable with bmi of 24   Vit D level is 44    BP Readings from Last 3 Encounters:  02/01/15 116/72  12/19/14 118/62  11/09/14 116/64     Patient Active Problem List   Diagnosis Date Noted  . Elevated transaminase level 10/24/2014  . Diabetes type 2, controlled 09/19/2014  . History of fall 08/18/2014  . Encounter for Medicare annual wellness exam 06/15/2014  . Memory loss 06/15/2014  . Anxiety disorder 06/15/2014  . Orthostatic hypotension 03/10/2014  . Nocturnal hypoxemia 01/18/2014  . Chronic systolic CHF (congestive heart failure) 10/20/2013  . Compression fracture of spine 06/10/2013  . Back pain of thoracolumbar region 06/09/2013  . Mobility impaired 04/16/2013  . Frequent PVCs 03/28/2013  . COPD with emphysema 03/28/2013  . Lung nodule, L lower lobe 10/15/2012  . Lobar pneumonia due to unspecified organism 06/11/2012  .  Abnormal CT scan, gastrointestinal tract 03/24/2012  . Nonspecific (abnormal) findings on radiological and other examination of biliary tract 03/24/2012  . Fatigue 03/18/2012  . Jaundice 03/18/2012  . Loss of appetite 03/18/2012  . Anemia 11/29/2011  . TINNITUS 08/09/2010  . OVERACTIVE BLADDER 03/14/2010  . GERD 11/06/2009  . Vitamin D deficiency 10/31/2008  . DIVERTICULOSIS OF COLON 12/25/2007  . RESTLESS LEG SYNDROME 10/26/2007  . COLONIC POLYPS, HX OF 10/26/2007  . HYPERCHOLESTEROLEMIA 09/23/2007  . OSTEOARTHRITIS 09/23/2007  . OSTEOPOROSIS 09/23/2007   Past Medical History  Diagnosis Date  . Osteoporosis   . Hyperlipidemia   . RLS (restless legs syndrome)   . Vitamin D deficiency   . GERD (gastroesophageal reflux disease)   . Cerumen impaction     recurrent  . Unexplained weight loss   . Cough   . Constipation   . Diarrhea   . Shingles   . Overactive bladder   . Cancer     pancreatic  . Arthritis     OA, in hands  . CHF (congestive heart failure)   . Pancreatic insufficiency   . Hiatal hernia    Past Surgical History  Procedure Laterality Date  . Cataract extraction      patient unsure of  date procedure was done.  . Cataract extraction  11/08    2nd cataract  . Ercp  03/24/2012    Procedure: ENDOSCOPIC RETROGRADE CHOLANGIOPANCREATOGRAPHY (ERCP);  Surgeon: Milus Banister, MD;  Location: [facility];  Service: Endoscopy;  Laterality: N/A;  . Eus  04/02/2012    Procedure: ESOPHAGEAL ENDOSCOPIC ULTRASOUND (EUS) RADIAL;  Surgeon: Milus Banister, MD;  Location: [facility];  Service: Endoscopy;  Laterality: N/A;  EUS with FNA  . Fine needle aspiration  04/02/2012    Procedure: FINE NEEDLE ASPIRATION (FNA) LINEAR;  Surgeon: Milus Banister, MD;  Location: [facility];  Service: Endoscopy;  Laterality: N/A;  . Endoscopic retrograde cholangiopancreatography (ercp) with propofol N/A 01/28/2013    Procedure: ENDOSCOPIC RETROGRADE CHOLANGIOPANCREATOGRAPHY (ERCP) WITH  PROPOFOL;  Surgeon: Milus Banister, MD;  Location: [facility];  Service: Endoscopy;  Laterality: N/A;  . Cardiac catheterization  10/14    [facility];no stent   History  Substance Use Topics  . Smoking status: Former Smoker -- 0.25 packs/day for 20 years    Types: Cigarettes    Quit date: 03/23/1992  . Smokeless tobacco: Never Used     Comment: PT SMOKED 2 CIGARETTES A DAY   . Alcohol Use: No   Family History  Problem Relation Age of Onset  . Diabetes Sister   . Hypertension Sister   . Heart disease Brother   . Fibromyalgia Daughter   . Alcohol abuse Son   . Heart disease Brother   . Diabetes Brother   . Heart disease Mother   . Pneumonia Father   . Heart failure Father   . Cancer Other     niece had breast and bone cancer   Allergies  Allergen Reactions  . Lactose Intolerance (Gi)     Pt can drink skim milk  . Hydrocodone Rash    Patient unsure of reaction, but it may have been diarrhea.  . Penicillins Rash    On scalp   Current Outpatient Prescriptions on File Prior to Visit  Medication Sig Dispense Refill  . aspirin EC 81 MG tablet Take 81 mg by mouth daily.    . Calcium Carbonate-Vitamin D (CALTRATE 600+D PO) Take 2 tablets by mouth daily.    . carvedilol (COREG) 3.125 MG tablet Take 1 tablet (3.125 mg total) by mouth 2 (two) times daily with a meal. 180 tablet 3  . CREON 3000-9500 UNITS CPEP TAKE 1 CAPSULE THREE TIMES A DAY 270 capsule 0  . ergocalciferol (VITAMIN D2) 50000 UNITS capsule Take 50,000 Units by mouth once a week.    . esomeprazole (NEXIUM) 40 MG capsule TAKE 1 CAPSULE (40 MG TOTAL) BY MOUTH DAILY BEFORE BREAKFAST. 90 capsule 3  . furosemide (LASIX) 20 MG tablet Take 20 mg by mouth daily. For weight greater than 130 pounds    . lisinopril (PRINIVIL,ZESTRIL) 2.5 MG tablet Take 1 tablet (2.5 mg total) by mouth daily. 90 tablet 3  . NON FORMULARY Oxygen 2 liters at bedtimes.    Vladimir Faster Glycol-Propyl Glycol (SYSTANE) 0.4-0.3 % GEL Apply to eye. 1-2 drops  in each eye BID prn    . polyethylene glycol powder (GLYCOLAX/MIRALAX) powder Take 17 g by mouth daily as needed. 527 g 3  . potassium chloride (K-DUR) 10 MEQ tablet Take 10 mEq by mouth daily. Only with lasix, for weight greater than 130 pounds    . predniSONE (DELTASONE) 10 MG tablet Alternate 10 mg with 5 mg every other day 50 tablet 3  . predniSONE (  DELTASONE) 10 MG tablet TAKE 1 TABLET BY MOUTH ON ODD DAYS AND 1/2 TAB ON EVEN DAYS 30 tablet 5  . predniSONE (DELTASONE) 5 MG tablet Alternate 10 mg with 5 mg every other day 50 tablet 3  . sodium chloride (OCEAN) 0.65 % SOLN nasal spray Place 1 spray into both nostrils as needed for congestion.    . traMADol (ULTRAM) 50 MG tablet Take 1 tablet (50 mg total) by mouth 2 (two) times daily. 30 tablet 0  . traMADol (ULTRAM) 50 MG tablet Take 1 tablet (50 mg total) by mouth 2 (two) times daily. 180 tablet 0   No current facility-administered medications on file prior to visit.    Review of Systems Review of Systems  Constitutional: Negative for fever, appetite change, fatigue and unexpected weight change.  Eyes: Negative for pain and visual disturbance.  Respiratory: Negative for cough and shortness of breath.   Cardiovascular: Negative for cp or palpitations    Gastrointestinal: Negative for nausea, diarrhea and constipation.  Genitourinary: Negative for urgency and frequency. Neg for excessive thirst  Skin: Negative for pallor or rash   Neurological: Negative for weakness, light-headedness, numbness and headaches.  Hematological: Negative for adenopathy. Does not bruise/bleed easily.  Psychiatric/Behavioral: Negative for dysphoric mood. The patient is occ nervous/anxious.         Objective:   Physical Exam  Constitutional: She appears well-developed and well-nourished. No distress.  HENT:  Head: Normocephalic and atraumatic.  Mouth/Throat: Oropharynx is clear and moist.  Eyes: Conjunctivae and EOM are normal. Pupils are equal, round,  and reactive to light. No scleral icterus.  Neck: Normal range of motion. Neck supple. No JVD present. No thyromegaly present.  Cardiovascular: Normal rate, regular rhythm and intact distal pulses.  Exam reveals gallop.   Pulmonary/Chest: Effort normal and breath sounds normal. No respiratory distress. She has no wheezes. She has no rales.  Abdominal: Soft. Bowel sounds are normal. She exhibits no distension and no mass. There is no tenderness.  Musculoskeletal: She exhibits no edema or tenderness.  Lymphadenopathy:    She has no cervical adenopathy.  Neurological: She is alert. She has normal reflexes. No cranial nerve deficit. She exhibits normal muscle tone. Coordination normal.  Skin: Skin is warm and dry. No rash noted. No erythema. No pallor.  Psychiatric: She has a normal mood and affect. Her mood appears not anxious. Her affect is not blunt and not labile. Cognition and memory are impaired. She does not exhibit a depressed mood. She exhibits abnormal recent memory.  Pt does repeat herself  Also tangential in thought           Assessment & Plan:   Problem List Items Addressed This Visit      Endocrine   Diabetes type 2, controlled    Lab Results  Component Value Date   HGBA1C 7.0* 01/18/2015   Suspect from prednisone and diet  Will change diet order to diabetic  Enc exercise  Re check 3 mo and begin metformin if not imp (pt wants to avoid more medication if possible) No symptoms         Other   Anxiety disorder    Daughter thinks pt does not need xanax D/c it -taken off med list  Will continue zoloft-refilled today  Mood is good       Elevated transaminase level    Improved with cessation of tylenol  Will continue to avoid this and follow  No abdominal sympt  Vitamin D deficiency - Primary    Improved with current supplementation - level 44 Disc imp to bone and overall health

## 2015-02-01 NOTE — Progress Notes (Signed)
Pre visit review using our clinic review tool, if applicable. No additional management support is needed unless otherwise documented below in the visit note. 

## 2015-02-01 NOTE — Telephone Encounter (Signed)
I wrote the diet order (in IN box)  Please ask them to talk to her family about the xanax - they urged me to D/C it - and let me know what you decide to do

## 2015-02-01 NOTE — Assessment & Plan Note (Signed)
Improved with cessation of tylenol  Will continue to avoid this and follow  No abdominal sympt

## 2015-02-01 NOTE — Assessment & Plan Note (Signed)
Daughter thinks pt does not need xanax D/c it -taken off med list  Will continue zoloft-refilled today  Mood is good

## 2015-02-01 NOTE — Telephone Encounter (Signed)
Bridget Walker @ [facility] Dr tower put 2 orders on sheet 1 to d/c [employer] said they are using this @ least once a week for behavior anxiety issues She would like dr tower to reconsider keeping this order  2nd  Change diabetic diet.  They don't have a 1500ada diet  What they can do is  no concentrated sweets limit her sweets and juices Sugar intact.  It this ok   Could you fax these in writing to [phone]

## 2015-02-01 NOTE — Assessment & Plan Note (Signed)
Improved with current supplementation - level 44 Disc imp to bone and overall health

## 2015-02-01 NOTE — Assessment & Plan Note (Signed)
Lab Results  Component Value Date   HGBA1C 7.0* 01/18/2015   Suspect from prednisone and diet  Will change diet order to diabetic  Enc exercise  Re check 3 mo and begin metformin if not imp (pt wants to avoid more medication if possible) No symptoms

## 2015-02-02 NOTE — Telephone Encounter (Signed)
Spoken to [place] at [facility]. Notified her of Dr Marliss Coots comments.

## 2015-02-02 NOTE — Telephone Encounter (Signed)
Bridget Walker left v/m requesting reinstatement of xanax and order faxed to [phone]. Seth Bake said she spoke with daughter and pts daughter was OK to restart the xanax. Any questions contact [place] at [phone].Please advise.

## 2015-02-03 ENCOUNTER — Other Ambulatory Visit: Payer: Self-pay | Admitting: Family Medicine

## 2015-02-03 NOTE — Telephone Encounter (Signed)
I wrote px for fax  Please let me know if they need directions to be any different

## 2015-02-03 NOTE — Telephone Encounter (Signed)
Fax the px to [facility] at [phone]. Left a voicemail for mandy if they needed anything else, they can give [place] a call.

## 2015-02-03 NOTE — Telephone Encounter (Signed)
Tried to call Leafy Ro at [facility] but no answer.

## 2015-02-03 NOTE — Telephone Encounter (Signed)
Called [name] (patient's daughter) notified her that the order has been faxed to [facility]. Also tried to call mandy with [name] but just voicemail.

## 2015-02-03 NOTE — Telephone Encounter (Signed)
Leafy Ro called from [facility] asking for Bridget Walker to call her back at [phone].

## 2015-02-03 NOTE — Telephone Encounter (Signed)
Order done and in IN box  

## 2015-02-03 NOTE — Telephone Encounter (Signed)
[name] phone # [phone]

## 2015-02-03 NOTE — Telephone Encounter (Signed)
Please advise 

## 2015-02-03 NOTE — Telephone Encounter (Signed)
[name] summers called stating ms [place] has an appointment with dr tower 04/26/15.  She wanted to know if dr tower would write an order for ms low to get her labs done at [facility] prior to coming in for appointment. She said is would be easier for ms Gavina [name] spoke with [place] and [facility] and [place] told her they could do the labs there.  Please let joyce know when order has been faxed to [facility]

## 2015-02-06 NOTE — Telephone Encounter (Signed)
Called and spoken to [place]. She received both faxed orders.

## 2015-02-10 ENCOUNTER — Other Ambulatory Visit: Payer: Self-pay

## 2015-02-10 ENCOUNTER — Other Ambulatory Visit: Payer: Self-pay | Admitting: Cardiovascular Disease

## 2015-02-10 NOTE — Telephone Encounter (Signed)
pts daughter left v/m requesting [facility]; pt will be out of med end of next week. Please advise.

## 2015-02-13 MED ORDER — TRAMADOL HCL 50 MG PO TABS: 50.0000 mg | ORAL_TABLET | Freq: Two times a day (BID) | ORAL | Status: DC

## 2015-02-13 NOTE — Telephone Encounter (Signed)
Printed to fax to [facility]

## 2015-02-13 NOTE — Telephone Encounter (Signed)
Rx faxed to [facility]

## 2015-02-21 ENCOUNTER — Ambulatory Visit: Payer: Medicare Other | Admitting: Family Medicine

## 2015-02-21 ENCOUNTER — Encounter: Payer: Self-pay | Admitting: Family Medicine

## 2015-02-21 ENCOUNTER — Ambulatory Visit (INDEPENDENT_AMBULATORY_CARE_PROVIDER_SITE_OTHER): Payer: Medicare Other | Admitting: Family Medicine

## 2015-02-21 VITALS — BP 116/70 | HR 93 | Temp 98.0°F | Wt 124.8 lb

## 2015-02-21 DIAGNOSIS — F068 Other specified mental disorders due to known physiological condition: Secondary | ICD-10-CM | POA: Diagnosis not present

## 2015-02-21 DIAGNOSIS — Z9181 History of falling: Secondary | ICD-10-CM | POA: Diagnosis not present

## 2015-02-21 DIAGNOSIS — R4182 Altered mental status, unspecified: Secondary | ICD-10-CM | POA: Diagnosis not present

## 2015-02-21 DIAGNOSIS — F411 Generalized anxiety disorder: Secondary | ICD-10-CM

## 2015-02-21 DIAGNOSIS — F0390 Unspecified dementia without behavioral disturbance: Secondary | ICD-10-CM

## 2015-02-21 DIAGNOSIS — S40022A Contusion of left upper arm, initial encounter: Secondary | ICD-10-CM | POA: Diagnosis not present

## 2015-02-21 DIAGNOSIS — F05 Delirium due to known physiological condition: Secondary | ICD-10-CM

## 2015-02-21 LAB — POCT URINALYSIS DIPSTICK
BILIRUBIN UA: NEGATIVE
Glucose, UA: NEGATIVE
Ketones, UA: NEGATIVE
LEUKOCYTES UA: NEGATIVE
NITRITE UA: NEGATIVE
Protein, UA: NEGATIVE
RBC UA: NEGATIVE
Spec Grav, UA: >=1.03
UROBILINOGEN UA: 0.2
pH, UA: 5.5

## 2015-02-21 MED ORDER — DONEPEZIL HCL 5 MG PO TABS: 5.0000 mg | ORAL_TABLET | Freq: Every day | ORAL | Status: DC
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
## 2015-02-21 NOTE — Assessment & Plan Note (Signed)
Memory loss has progressed-pt has more episodes of confusion also Disc medication to slow down progression of dementia -px 5 mg aricept and given handout on it-they will look at it  If she decides to take it and does well (disc side eff in detail)-can inc dose to 10 mg in a month They will obs closely
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
## 2015-02-21 NOTE — Assessment & Plan Note (Signed)
Has xanax ordered prn now (for agitation at night that may be rel to dementia) Knows to watch closely for falls or MS change

## 2015-02-21 NOTE — Progress Notes (Signed)
Pre visit review using our clinic review tool, if applicable. No additional management support is needed unless otherwise documented below in the visit note. 

## 2015-02-21 NOTE — Assessment & Plan Note (Signed)
More confused lately - I suspect this is dementia related  Disc tx opt Given info on and px for aricept (see assessment for dementia)
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
## 2015-03-02 ENCOUNTER — Other Ambulatory Visit: Payer: Self-pay | Admitting: Family Medicine

## 2015-03-10 ENCOUNTER — Telehealth: Payer: Self-pay | Admitting: Family Medicine

## 2015-03-10 NOTE — Telephone Encounter (Signed)
[name] from [facility] calling.  Pt given rx for Aricept.  Family decided they did want the patient to start taking this medication.  [name] cannot start giving the medication until she has an order from Dr. Glori Bickers to administer this medication at [facility].  She does have the medication in hand but has to have this order.  She said it can be hand written on a rx pad or anything that works best and then it can just be faxed over.  Leafy Ro also said it will be okay for Dr. Glori Bickers to send this on Monday when she returns.   Fax:  [phone] Phone:  [phone]

## 2015-03-13 NOTE — Telephone Encounter (Signed)
I will do a letter -in IN box

## 2015-03-13 NOTE — Telephone Encounter (Signed)
Letter faxed to [name].  [phone].

## 2015-03-24 ENCOUNTER — Other Ambulatory Visit: Payer: Self-pay | Admitting: Cardiovascular Disease

## 2015-03-29 ENCOUNTER — Ambulatory Visit: Payer: Medicare Other | Admitting: Cardiovascular Disease

## 2015-03-31 NOTE — H&P (Signed)
PATIENT NAME:  Bridget Walker, Bridget Walker MR#:  [id number] DATE OF BIRTH:  1925/08/01  DATE OF ADMISSION:  12/04/2012  REFERRING PHYSICIAN: Dr. Renard Hamper.  PRIMARY CARE PHYSICIAN:  [place] ___________ Medical   REASON FOR ADMISSION: Fever, respiratory distress and septic shock.   HISTORY OF PRESENT ILLNESS: Bridget Walker is a very nice 81 year old female who has history of recently diagnosed pancreatic cancer. She goes to [facility] for her care and recently she moved into an independent living facility. She has been very active and getting around without much problems. Her primary care physician recently started her on steroids due to the fact that the patient continues to have respiratory problems with significant wheezing.  Apparently this is related to possible COPD, but also to a significant hiatal hernia, which has created severe reflux that goes into her lungs and irritates her airways. The patient has been doing okay up until this morning when she started having some significant chills and started to have jerking movements and having increase in temperature.  She felt really cold, but she was sweating. She has been really weak and started to have some cough and increased shortness of breath today. She has not been able to spit up anything, but she states that she cannot catch air.   PAST MEDICAL HISTORY:  As far medical history, She states that she has been having a little trouble within the last couple of years, to the point that she has been on oxygen for the past couple months, due to all these issues and possibly developing worsening of COPD and maybe even CHF, although she is not quite sure about this diagnosis.   REVIEW OF SYSTEMS: Positive fever.  Positive chills. Positive fatigue. Positive weakness. Positive for significant weight loss.  EYES: Denies any blurry vision or double vision.  ENT: Denies any difficulty swallowing, postnasal drip. She had some rhinorrhea last week, but has improved. She denies any  pain with swallowing.  RESPIRATORY: Positive cough. Positive shortness of breath.  CARDIOVASCULAR: No chest pain. No orthopnea, no syncope, no palpitations.  GASTROINTESTINAL: Positive GI distress with nausea, some vomiting and diarrhea after a heavy meal on Christmas Eve.  She has been having significant diarrhea, which has improved.  GENITOURINARY: Denies any dysuria or hematuria. The patient says that she has been urinating more frequently.  GYN: No breast masses.  ENDOCRINE: No polyuria, polydipsia, or polyphagia. No cold or heat intolerance.  HEME/LYMPH: Negative for bleeding. The patient has mild anemia. No swollen glands.  SKIN: No acne or rashes.  MUSCULOSKELETAL: Positive back pain and myalgias for the last day.  NEUROLOGIC: No numbness, no tingling. No CVAs. No TIAs.  PSYCHIATRIC: Negative for anxiety or depression. At this moment, she does take medications for anxiety and depression, but she states it is poorly controlled.  MUSCULOSKELETAL: She had a compression fracture of lumbar vertebrae, which had significant pain, but it has improved now.   PAST MEDICAL HISTORY:  1.  Pancreatic cancer.  2.  COPD.  3.  Hiatal hernia.  4.  History of shingles.  5.  Anxiety.  6.  Jaundice.  7.  GERD.  8.  Hiatal hernia.  9.  Malnutrition, due to the cancer, moderate. 10.  Compression fracture of lumbar vertebrae.  11.  Chronic pain treated with Roxanol.  12.  Chronic respiratory failure with 2 liters of oxygen at home.   ALLERGIES:  1.  PENICILLIN, which gives her a rash and hives.  2.  VICODIN.  3.  HYDROCODONE.  PAST  SURGICAL HISTORY: The patient had a stent put in one of her kidneys: Unknown reason.   FAMILY HISTORY: The patient states that there is no significant family history of coronary artery disease or cancer.   MEDICATIONS:  1.  Chlorpromazine suppository p.r.n.  2.  Creon 3 capsules with each meal and 2 with snacks. 3.  Ibuprofen 200 mg tablets, 600 mg every six hours  p.r.n. pain. 4.  Megace 5 mL or 200 mg twice daily. 5.  Meloxicam 7.5 mg daily. 6.  MiraLax 1/2 capsule daily.  7.  Mucinex 600 mg twice daily.  8.  Nexium 40 mg daily. 9.  Oxygen 2 liters nasal cannula. 10.  Prednisone 10 mg every other day, alternating with 5 mg every other day.  11.  Roxanol 0.25 to 1 mL every two hours p.r.n. pain. 12.  Ultram 50 mg every six hours p.r.n. pain. 13.  Xanax 0.25 mg p.r.n. anxiety.   SOCIAL HISTORY: The patient used to smoke.  She quit 20 years ago. She states she smoked for about 20 years. She denies any alcohol. She is living in an independent living. Her family is here with her tonight. She is hospice and she is DO NOT RESUSCITATE.   PHYSICAL EXAMINATION:  VITAL SIGNS: Blood pressure 70/46, pulse 124, respiratory around 34, oxygen saturation down to 95 on 2 liters, now down to the 80s on room air, temperature 102.  GENERAL: Patient is alert. She is oriented. She is critically ill. She looks severely dehydrated. Moderately nourished.  Well-dressed.  HEENT:  Pupils are equal and reactive. Extraocular movements are intact. Positive icterus on sclerae, pale conjunctivae. No oral lesions. The patient has significantly cracked lips and very dry mucosa. Trachea central.  NECK: Supple. No JVD. No thyromegaly. No adenopathy. No carotid bruits. No rigidity. CARDIOVASCULAR: Tachycardic. Regular rate and rhythm. No rubs, or gallops. Positive systolic ejection murmur 1/6.  No displacement of PMI.  LUNGS: Rales in the right base and some crackles, diffuse in both respiratory fields. The patient has no dullness to percussion.  ABDOMEN: Soft, nontender, nondistended. No hepatosplenomegaly. No masses. Bowel sounds are positive.  GENITOURINARY: Negative for external lesions.  EXTREMITIES: No edema, no cyanosis, no clubbing. Pulses +2. Capillary refill less than three.  NEUROLOGIC: Cranial nerves II through XII intact. No focal symptoms.  PSYCHIATRIC: No significant  anxiety or agitation.  SKIN: Without any rashes or petechiae.  Very cold skin and decreased signs of circulation distally.   MUSCULOSKELETAL: No significant joint deformity. Positive pain to palpation of spinal processes of the lumbar spine.   DIAGNOSTIC DATA:  Potassium is 2.2, sodium 140, creatinine 0.86, glucose 132, BNP 7000. CO2 decreased at 19, magnesium is 1.3, calcium 8.1, total protein 6, albumin of 2.7, alkaline phosphatase 296, SAT 68, ALT 100, troponin I 1.4.  White blood cells 1.5, hemoglobin of 11, platelets 174.  Urinalysis has significant elevation of white blood cells at 47, positive leukocyte esterase, positive nitrate, pH 7.5, pCO2 at 19, pO2 at 64. Lactic acid 4.8.   CHEST X-RAY: Possible infiltrate on lower right base and some signs of pulmonary congestion.   ASSESSMENT AND PLAN: This is a very nice 81 year old female with history of pancreatic cancer, chronic obstructive pulmonary disease, shingles, anxiety, chronic respiratory failure on 2 liters of oxygen, who is on hospice and is a DO NOT RESUSCITATE.  1.  Septic shock. The patient comes with significant signs of sepsis, temperature 102. Blood pressure 60s/40s, heart rate in the 120s and her  respiratory rate of 34.  She comes with respiratory failure for what could be just pneumonia, but also she has a urinary tract infection, which is likely due to the fact that she has been having diarrhea and contaminating her urinary tract. The patient is going to be treated with broad-spectrum antibiotics.  She is allergic to penicillin.  I am going to treat her for pneumonia since the patient has severe respiratory distress, increased oxygen needs, increased respiratory rate and x-ray that might be the beginning of pneumonia. She is going to get vancomycin since the patient was recently hospitalized and she lives in an independent living facility. She is going to get aztreonam and Levaquin since the patient is allergic to penicillin.  2.   The patient is not going to be admitted to the Intensive Care Unit, due to the fact that the family and she decided not to go into severe measurements.  They do not want pressors, they do not want a central line and they do not want to be in the Intensive Care Unit.  3.  Respiratory failure. The patient on oxygen at home.  This is acute/chronic.  Her respiratory rate was in 30s. She was hyperventilating and her pCO2 was 19.  She is septic with lactic acid of 4.8, likely due to pneumonia and also urinary tract infection.  5.  Metabolic acidosis, due to sepsis. Continue IV fluids, but also give her bicarbonate drip if it worsens.  6.  Elevated troponins.  At this moment I think this is just due to all the stress that she is having with increased heart rate and demand ischemia. I do not think I am going to pursue this since the patient will not have any significant benefit from cardiac intervention.  7.  Pancreatic cancer. The patient is having already jaundice, which means that her pancreatic cancer has spread out. She is a terminal patient on hospice.  8.  Hypomagnesemia. Replace.  9.  Hypokalemia.  Replace.  10.  Decreased white blood cells. The patient is on prednisone. The patient is immune sufficient. As the patient is on chronic steroids, we are going to treat it with steroid stress doses. This also may be the source of her decreased immune system from the steroids, but at this moment, we have to consider the possibility of this being some helpful intervention. I am going to give her 100 mg stat and then stress doses of 50 mg every six hours.   Clinical care time for this patient 75 minutes today.   Case discussed with the family for a long time.   ____________________________ Dent Sink, MD rsg:eg D: 12/04/2012 20:12:45 ET T: 12/04/2012 21:14:06 ET JOB#: [id number]  cc: [place] Sink, MD, <Dictator> Lavonne Kinderman America Brown MD ELECTRONICALLY SIGNED 12/10/2012 7:49

## 2015-03-31 NOTE — Discharge Summary (Signed)
PATIENT NAME:  Bridget Walker, Bridget Walker MR#:  [id number] DATE OF BIRTH:  Nov 10, 1925  DATE OF ADMISSION:  10/06/2013 DATE OF DISCHARGE:    REASON FOR ADMISSION:  Hypotension.   DISCHARGE DIAGNOSES: 1.  Hypotension, likely related to medications.   2.  History of acute systolic congestive heart failure on previous hospitalization.  3.  Chronic kidney disease.   MEDICATIONS AT DISCHARGE: Nexium 40 mg once daily, Creon 3000 units 3 times a day  9500 and 15,000 units; prednisone 5 mg 2 tablets every other day; vitamin D 50,000 units once a day; Megace 400 mg every day; alprazolam 0.25 mg 3 times a day; MiraLax 17 g as needed for constipation; Tylenol 500 mg 1 to 2 tablets every 6 hours; Mucinex 600 mg once a day; Fosamax 70 mg once a week; tramadol 50 mg 2 times daily; aspirin 81 mg daily; Klor-Con 20 mEq once a day; Toprol-XL 25 mg take half of the dose once a day; lisinopril 2.5 mg once daily; furosemide 20 mg take 1/2 tablet every other day, take the whole tablet if there is any gaining weight of more than 2 pounds in 24 hours or more than 5 pounds in 1 week.   FOLLOWUP: Dr. Devona Konig in the next 1 to 2 weeks and primary care physician at skilled nursing facility in the next 1 to 2 days.   HOSPITAL COURSE: An 81 year old female with history of pancreatic insufficiency, COPD, chronic steroid use, history of pneumonitis, hiatal hernia, GERD, recently admitted to the hospital for congestive heart failure on 10/01/2013, discharged 10/04/2013 with exertional dyspnea due to systolic congestive heart failure. Had a cardiac catheterization, which was essentially normal with an ejection fraction of 30%. The patient was discharged on Lasix, lisinopril and Coreg as new medications.  At the assisted living facility, she was found to have a systolic blood pressure of 80, diastolic of 50, for which she was brought back to [place] [facility]  The patient was overall asymptomatic, except for mild confusion, which is likely due to the  medications given during the heart catheterization. The patient got 500 mL bolus of NS and her Lasix was held.  Her blood pressure is back to normal. The patient is able to be discharged today with changes in her medications, decreasing the dose of Lasix as mentioned above, and continuing metoprolol and lisinopril.  Hold lisinopril and metoprolol for systolic blood pressures below 90s.    Her other medical problems were stable. I had a long conversation with the patient, nursing and social work. I am discharging the patient today down to assisted living facility, as the patient has not been able to ambulate for the past week and a half. She is going to have physical therapy together with rehabilitation.     TIME SPENT: I spent about 45 minutes on this discharge.   Please refer to previous discharge summary dictated by Dr. Darvin Neighbours.   ____________________________ Esko Sink, MD rsg:dp D: 10/06/2013 14:06:00 ET T: 10/06/2013 14:39:36 ET JOB#: [id number]  cc: [place] Sink, MD, <Dictator>   [place] MD ELECTRONICALLY SIGNED 10/22/2013 23:41

## 2015-03-31 NOTE — Consult Note (Signed)
Full consult to follow. Patient presented with sighns and symptoms of CHF and has severe LV systolic dysfunction with septal akinesis. Once  CHF is in  compensated state,will consider cardiac cath. Thanks.  Electronic Signatures: Angelica Ran (MD)  (Signed on 25-Oct-14 10:19)  Authored  Last Updated: 25-Oct-14 10:19 by Angelica Ran (MD)

## 2015-03-31 NOTE — H&P (Signed)
PATIENT NAME:  Bridget Walker, Bridget Walker MR#:  [id number] DATE OF BIRTH:  08-Jul-1925  DATE OF ADMISSION:  10/05/2013  REFERRING PHYSICIAN:  Dr. Owens Shark.   PRIMARY CARE PHYSICIAN:  Dr. Glori Bickers.   CHIEF COMPLAINT:  Hypotension.   HISTORY OF PRESENT ILLNESS:  This is an 81 year old Caucasian female with past medical history of pancreatic insufficiency, possible COPD, chronic steroid usage for presumptive pneumonitis related to hiatal hernia, gastroesophageal reflux disease, who was recently admitted for acute congestive heart failure, presenting with hypotension.  She was recently admitted from 10/01/2013 through 10/04/2013 for dyspnea on exertion felt to be secondary to systolic congestive heart failure, underwent cardiac catheterization which was essentially normal.  Coronary blood flow anatomy however revealed ejection fraction of 30%.  She was started on Lasix and lisinopril, multiple doses on discharge.  At her nursing facility she complained of feeling cold and generalized weakness.  Blood pressure taken.  She was found to be in low 80s/50s.  She was then brought to [facility] for further work-up and evaluation.  Currently, she is without complaint.   REVIEW OF SYSTEMS: CONSTITUTIONAL:  Denies fever or pain.  Positive for fatigue and weakness.  EYES:  Denies blurred vision, double vision.  EARS, NOSE, THROAT:  Denies ear pain, hearing loss, discharge.  RESPIRATORY:  Denies cough, wheeze, shortness of breath.  CARDIOVASCULAR:  Denies chest pain, palpitations, edema.  GASTROINTESTINAL:  Denies nausea, vomiting, diarrhea, abdominal pain.  GENITOURINARY:  Denies dysuria, hematuria.  ENDOCRINE:  Denies nocturia or thyroid problems.  HEMATOLOGY AND LYMPHATIC:  Denies easy bruising or bleeding.  SKIN:  Denies any rashes or lesions.  MUSCULOSKELETAL:  Denies pain in the neck, back, shoulder, knees, hips, any arthritic symptoms.  NEUROLOGIC:  Denies paralysis, paresthesias.  PSYCHIATRIC:  Denies anxiety or  depressive symptoms.   Otherwise full review of systems performed by me is negative.   PAST MEDICAL HISTORY:  Systolic congestive heart failure, ejection fraction 30%, possible COPD, chronic steroid use secondary to presumptive pneumonitis, hiatal hernia, gastroesophageal reflux disease, large hiatal hernia, anxiety and pancreatic insufficiency.   SOCIAL HISTORY:  Remote tobacco abuse approximately 20 to 30 years ago.  Denies any alcohol usage.  Currently resides at assisted living at [facility], however taken to the skilled nursing facility after this episode.   FAMILY HISTORY:  Negative for any cardiac or pulmonary disease in her relatives.   ALLERGIES:  VICODIN, PENICILLIN, HYDROCODONE AND LACTULOSE.   HOME MEDICATIONS:  Alprazolam 0.25 mg by mouth three times daily as needed for anxiety, aspirin 81 mg by mouth  daily, calcitonin 200 international units nasal spray one spray daily, Caltrate 600 mg plus vitamin D 2 tabs by mouth daily, Creon 3000/9500/15,000 units delayed release three times daily with meals, Fosamax 70 mg by mouth q. weekly on Fridays, Lasix 20 mg by mouth  daily, potassium 20 mEq by mouth  daily, lisinopril 2.5 mg by mouth  daily, Megace 40 mg/mL 10 mL suspension daily, MiraLAX 17 grams by mouth as needed for constipation, Mucinex 600 mg as needed for cough, Nexium 40 mg by mouth daily, prednisone 5 mg alternating with 10 mg every other day, Toprol-XL 25 mg 1/2 tablet by mouth daily, tramadol 50 mg by mouth twice daily for pain, Tylenol 500 mg 1 to 2 tabs q. 6 hours as needed for pain, vitamin D2 50,000 international units once weekly on Monday.   PHYSICAL EXAMINATION: VITAL SIGNS:  Temperature 97.7, heart rate 95, respirations 20, blood pressure 96/53, weight 52.2 kg, BMI at  19.8.  GENERAL:  Chronically ill-appearing Caucasian female who appears frail, though currently in no acute distress.  HEAD:  Normocephalic, atraumatic.  EYES:  Pupils equal, round, reactive to light.   Extraocular muscles intact.  No scleral icterus.  MOUTH:  Moist mucosal membranes.  Dentition intact.  No abscess noted.  EARS, NOSE, THROAT:  Throat clear without exudates.  No external lesions.  NECK:  Supple.  No thyromegaly.  No nodules.  No JVD.  PULMONARY:  Clear to auscultation bilaterally.  No wheezes, rubs, or rhonchi.  No use of accessory muscles.  Good respiratory effort.  CHEST:  Nontender to palpation.  CARDIOVASCULAR:  S1, S2, regular rate and rhythm.  No murmur, rub or gallop.  No edema.  Pedal pulses 2+.  GASTROINTESTINAL:  Soft, nontender, nondistended.  No masses.  Positive bowel sounds.  No hepatosplenomegaly.  MUSCULOSKELETAL:  No swelling, clubbing or edema.  Range of motion full in all extremities.  NEUROLOGIC:  Cranial nerves II through XII intact.  No gross focal neurological deficits.  Sensation intact.  Reflexes intact.  SKIN:  No ulcerations, lesions, rashes, cyanosis.  Skin is warm and dry.   PSYCHIATRIC:  Mood and affect within normal limits.  She is awake, alert and oriented x 3.  Insight and judgment intact.   LABORATORY DATA:  Sodium 136, potassium 4.8, chloride 105, bicarb 24, BUN 44, creatinine 1.32, glucose 150.  Troponin I less than 0.02.  WBC 10, hemoglobin 14, platelets 232.   ASSESSMENT AND PLAN:  An 81 year old Caucasian female who was recently discharged 70/35/0093 for acute systolic congestive heart failure, underwent cardiac catheterization, essentially normal who is presenting with hypotension.  1.  Hypotension, now normotensive after a 500 mL bolus.  We will continue home cardiac medications with holding of Lasix.  2.  Systolic congestive heart failure, ejection fraction 30%.  Continue aspirin, lisinopril, metoprolol.  Hold Lasix.    3.  Pancreatic insufficiency.  Continue Creon. 4.  Gastroesophageal reflux disease.  Continue Nexium.  5.  Anxiety.  Continue alprazolam.  6.  DVT prophylaxis with heparin subQ.  7.  CODE STATUS:  THE PATIENT IS DO NOT  RESUSCITATE.   TIME SPENT:  45 minutes.     ____________________________ Aaron Mose. Thoren Hosang, MD dkh:ea D: 10/06/2013 01:16:20 ET T: 10/06/2013 01:36:29 ET JOB#: [id number]  cc: Aaron Mose. Cimberly Stoffel, MD, <Dictator> Enza Shone Woodfin Ganja MD ELECTRONICALLY SIGNED 10/06/2013 2:51

## 2015-03-31 NOTE — H&P (Signed)
PATIENT NAME:  Bridget Walker, Bridget Walker MR#:  [id number] DATE OF BIRTH:  May 16, 1925  DATE OF ADMISSION:  12/04/2012  ADDENDUM  I went to check on the patient again at the request of the family. We had another long discussion about her medical care. At this moment we are going to proceed to put her on the floor and try to just give antibiotics and a little bit of IV fluids. Due to the fact that the patient has been taking steroids, we put on a stress dose of steroids with hydrocortisone to see if this helps her blood pressure. We are going to give her one bolus of albumin to see if we can expand her volume since she has low albumin levels as well. She is risk of pulmonary edema due to the IV fluids. Hopefully the expansion of her oncotic pressure with albumin will help prevent worsening of pulmonary edema although the patient and the family are aware of the consequences of this. They are okay proceeding with this. I spent another 15 minutes with the patient.  TOTAL CARE TIME FOR THIS PATIENT TODAY: 90 minutes   ____________________________ [place] Sink, MD rsg:jm D: 12/04/2012 22:30:02 ET T: 12/05/2012 16:49:30 ET JOB#: [id number]  cc: [place] Sink, MD, <Dictator> Hyrum Shaneyfelt America Brown MD ELECTRONICALLY SIGNED 12/10/2012 7:49

## 2015-03-31 NOTE — Discharge Summary (Signed)
PATIENT NAME:  Bridget Walker, Bridget Walker MR#:  [id number] DATE OF BIRTH:  10/08/1925  DATE OF ADMISSION:  10/01/2013 DATE OF DISCHARGE:  10/04/2013  DISCHARGE DIAGNOSES:  1. Acute systolic congestive heart failure with ejection fraction of 30%.  2. Mild coronary artery disease.  3. Chronic obstructive pulmonary disease with chronic respiratory failure.  4. Chronic kidney disease stage III.  5. Mild hyperkalemia.   CONSULTANTS: Dionisio David, MD, with cardiology.   IMAGING STUDIES DONE: Include:  1. A chest x-ray which showed pulmonary edema.  2. Echocardiogram showed ejection fraction of 30% to 35% with wall motion abnormalities.   PROCEDURES: Cardiac catheterization showed normal coronaries, mild coronary artery disease.   ADMITTING HISTORY AND PHYSICAL: Please see detailed H and P dictated by Dr. Lavetta Nielsen on 10/01/2013. In brief, an 81 year old female patient with history of hypertension, CKD, COPD and chronic respiratory failure, who presented to the hospital with shortness of breath. The patient was found to have new-onset congestive heart failure and admitted to the hospitalist service.   HOSPITAL COURSE: Acute on chronic respiratory failure secondary to acute systolic congestive heart failure. The patient had an echocardiogram done which showed EF of 30%, after which Dr. Humphrey Rolls with cardiology was consulted. The patient had a cardiac catheterization done secondary to focal wall motion abnormalities. She had normal coronaries. The patient was on IV fluids prior to and after cardiac catheterization secondary to her chronic kidney disease. The patient without any chest pain. Shortness of breath improved back to normal. She is being discharged back to assisted living facility.   Prior to discharge, the patient did not have any crackles on examination. Cardiac examination showed S1, S2 with a systolic murmur.   DISCHARGE MEDICATIONS: Include:  1. Nexium 40 mg daily.  2. Creon 3000/9500 units oral 3 times  a day.  3. Caltrate 600 plus vitamin D 2 tablets oral once a day.  4. Prednisone 5 mg 2 tablets oral every other day, alternating with 1 tablet every other day.  5. Vitamin D2 50,000 international units oral once a week.  6. Megace 10 mL oral once a day.  7. Calcitonin 200 international units once a day.  8. Alprazolam 0.25 mg oral 3 times a day as needed for anxiety.  9. MiraLax oral once a day as needed for constipation.  10. Tylenol 500 mg 1 to 2 tablets orally every 6 hours as needed for pain.  11. Mucinex 600 mg oral once a day as needed for cough.  12. Fosamax 70 mg oral once a week.  13. Tramadol 50 mg oral 2 times a day as needed for pain.  14. Aspirin 81 mg daily.  15. Lasix 20 mg daily.  16. Potassium chloride 20 mEq oral daily.  17. Toprol-XL 25 mg 1/2-tablet once a day.  18. Lisinopril 2.5 mg oral once a day.    DISCHARGE INSTRUCTIONS: Low-sodium diet. Activity as tolerated. Follow up with Dr. Humphrey Rolls with cardiology in 1 to 2 weeks.   TIME SPENT ON DAY OF DISCHARGE IN DISCHARGE ACTIVITY: 40 minutes.   ____________________________ Leia Alf Aletta Edmunds, MD srs:lb D: 10/06/2013 13:29:17 ET T: 10/06/2013 14:15:51 ET JOB#: [id number]  cc: Alveta Heimlich R. Lynann Demetrius, MD, <Dictator> Neita Carp MD ELECTRONICALLY SIGNED 10/06/2013 14:55

## 2015-03-31 NOTE — H&P (Signed)
PATIENT NAME:  Bridget Walker, Bridget Walker MR#:  [id number] DATE OF BIRTH:  03-27-1925  DATE OF ADMISSION:  10/01/2013  REFERRING PHYSICIAN: Dr. Jacqualine Code   PRIMARY CARE PHYSICIAN: Dr. Glori Bickers   CHIEF COMPLAINT: Shortness of breath   HISTORY OF PRESENT ILLNESS:  This is an 81 year old Caucasian female with past medical history of CHF, possible COPD, questionable lung disease secondary to hiatal hernia, for which she follows pulmonary out of [facility] and is on chronic steroids. She is presenting with shortness of breath, states her symptoms were progressively worsening for one week duration. She describes it as dyspnea on exertion. Previously, she was ambulatory and able to walk without oxygen, though for last week she was short of breath after walking around her apartment and today at her assisted living facility, [facility], she was noted to be short of breath while attempting to eat.  She also has noted associated trace lower extremity edema for about 2 to 3 days' duration; however, she denies any orthopnea, cough, fever, chills or sick contacts. As far as her baseline respiratory illness, she was intermittently on oxygen therapy approximately one year ago, but she has since been tapered off. She is also on chronic steroids for presumed pneumonitis secondary to hiatal hernia, and she has been tapered down in the Emergency Department. She desaturated into the low 80s when ambulating on room air.    REVIEW OF SYSTEMS: CONSTITUTIONAL: Denies any fevers, chills, fatigue, weakness.  EYES: Denies blurry vision, double vision, eye pain.  EARS, NOSE, THROAT: Denies tinnitus, ear pain or hearing loss.  RESPIRATORY: Denies cough or wheeze. Positive for dyspnea on exertion.  CARDIOVASCULAR: Denies chest pain, orthopnea. Positive for trace edema and dyspnea on exertion.  GASTROINTESTINAL: Denies nausea, vomiting, diarrhea, abdominal pain.  GENITOURINARY: Denies dysuria, hematuria.  ENDOCRINE: Denies nocturia or thyroid  problems.  HEMATOLOGIC AND LYMPHATIC:  Denies easy bruising or bleeding.  SKIN: Denies any rashes, lesions.  MUSCULOSKELETAL: Denies pain in her neck, back, shoulder knees, hips or arthritic symptoms.  NEUROLOGIC: Denies any paralysis or paresthesias.  PSYCHIATRIC: Denies any anxiety or depressive symptoms.  Otherwise, full review of systems performed by me is negative.   PAST MEDICAL HISTORY: Congestive heart failure of unknown type, possible COPD with long-standing lung disease secondary to hiatal hernia for which she follows in pulmonary. She is also on chronic steroids for this. Gastroesophageal reflux disease, large hiatal hernia, anxiety. Of a side note, she was previously diagnosed with pancreatic cancer which turned out to be an incorrect diagnosis; however, she does have pancreatic insufficiency.   SOCIAL HISTORY: Remote tobacco usage approximately 20 to 30 years ago. Denies any alcohol usage. Currently resides at assisted living facility, [facility].   FAMILY HISTORY: Denies any cardiac or pulmonary diseases in her relatives.   ALLERGIES: VICODIN, PENICILLIN, HYDROCODONE AND LACTULOSE.   HOME MEDICATIONS: Alprazolam 0.25 mg p.o. t.i.d. as needed for anxiety, calcitonin 200 international units in nasal spray once daily, calcium citrate 600 plus vitamin D two tabs p.o. daily, Creon 3000/9500/15000 units oral 3 times daily with meals, Fosamax 70 mg p.o. once weekly on Friday, ibuprofen 200 mg 2 tabs up to every eight hours as needed for pain, Megace 40 mg oral suspension once daily at bedtime, meloxicam 7.5 mg p.o. daily, MiraLAX 17 grams as needed for constipation, Mucinex 600 mg p.o. as needed for cough, Nexium 40 mg p.o. daily, prednisone 5 mg alternating with 10 mg p.o. daily, tramadol 50 mg p.o. b.i.d., Tylenol 500 mg 1 to 2 tabs  p.o. q. 6 hours as needed for pain, vitamin D2 5000 international units once weekly on Mondays.   PHYSICAL EXAMINATION: VITAL SIGNS: Temperature 97.9, heart  rate 94, respirations 20, blood pressure 168/88, saturating 94% on 3.5 liters nasal cannula. Weight 56.7 kg, BMI 24.4.  GENERAL:  Chronically ill-appearing Caucasian female who is in mild distress secondary to respiratory status.  HEAD: Normocephalic, atraumatic.  EYES: Pupils equal, round, react to light. Extraocular muscles intact. No scleral icterus.  MOUTH: Moist mucosal membranes. Dentition intact, no abscesses noted.  EARS, NOSE, THROAT: Throat clear without exudate. No external lesions.  NECK: Supple. No thyromegaly or nodules. No JVD.  PULMONARY: Clear to auscultation bilaterally without wheezes, rubs or rhonchi. No use of accessory muscles. Good respiratory effort.  CHEST: Nontender to palpation. She is on supplemental O2, 3.5  liters nasal cannula.  CARDIOVASCULAR: S1, S2, regular rate and rhythm. No murmurs, rubs or gallops. Trace pedal edema to the ankles bilaterally. Pedal pulses 2+ bilaterally.  GASTROINTESTINAL: Soft, nontender, nondistended. No masses. Positive bowel sounds. No hepatosplenomegaly.  MUSCULOSKELETAL: No swelling, clubbing. Edema as above. Range of motion is full in all extremities.  NEUROLOGIC: Cranial nerves II through XII intact. No gross focal neurological deficits. Sensation intact. Reflexes intact.  SKIN: No ulcerations, lesions, rashes or cyanosis. Skin is warm and dry, turgor is intact.  PSYCHIATRIC:  Mood and affect within normal limits. Awake, alert, and oriented x3. Insight and judgment intact.   LABORATORY DATA: Sodium 139, potassium 4.7, chloride 109, bicarbonate 23, BUN 23, creatinine 1.06. BNP 793. Glucose 124. Troponin I less than 0.02. WBC 9.9, hemoglobin 13.8, platelets of 209.   Chest x-ray revealing large hiatal hernia with partial intrathoracic stomach, increased interstitial markings.   EKG: Normal sinus rhythm, heart rate 91. No ST or T wave abnormalities.   ASSESSMENT AND PLAN: An 81 year old female with past medical history of congestive  heart failure, as well as possible chronic obstructive pulmonary disease and questionable lung disease secondary to hiatal hernia for which she follows with pulmonary who has been intermittent on oxygen therapy and on chronic steroids, presenting with shortness of breath.  1.  Dyspnea on exertion, likely related to acute on chronic congestive heart failure, though also  possible multifactorial with chest x-ray giving evidence of volume overload. We will do strict intake and output and provide Lasix. We will monitor renal function and daily weights. Check a transthoracic echocardiogram. DuoNeb treatments and incentive spirometry. Supplemental O2 to keep SaO2 greater than 92%. If she does not improve with gentle diuresis, we will need pulmonary input and will likely need to increase steroids as pneumonitis is a likely possibility.  2.  Pancreatic insufficiency. Continue to give Creon.  3.  Gastroesophageal reflux disease.  Nexium.  4.  Anxiety. Alprazolam p.r.n.  5.  Venous thromboembolism prophylaxis. Heparin subcutaneous.  6.  The patient is DO NOT RESUSCITATE.   TIME SPENT: 45 minutes.    ____________________________ Aaron Mose. Hower, MD dkh:cc D: 10/01/2013 00:39:00 ET T: 10/01/2013 01:03:49 ET JOB#: [id number]  cc: Aaron Mose. Hower, MD, <Dictator> DAVID Woodfin Ganja MD ELECTRONICALLY SIGNED 10/01/2013 20:06

## 2015-03-31 NOTE — Consult Note (Signed)
PATIENT NAME:  Bridget Walker, Bridget Walker MR#:  [id number] DATE OF BIRTH:  [birth year]  DATE OF CONSULTATION:  10/03/2013  CONSULTING PHYSICIAN:  Dionisio David, MD  INDICATION FOR CONSULTATION: CHF.  HISTORY OF PRESENT ILLNESS: This is an 81 year old white female with a past medical history of CHF, COPD, history of chronic pancreatitis, who came into the Emergency Room with severe shortness of breath, PND, orthopnea, and leg swelling. She denied any fever or cough, but had significant swelling of the legs. She has been seeing a pulmonologist in [place], and she was told she had pneumonitis and hiatal hernia. She was desaturating when she came into the Emergency Room, in the 80s at room air.   PAST MEDICAL HISTORY: History of CHF, COPD, hiatal hernia, GI reflux. She had diagnosis of pancreatic cancer, which turned out to be incorrect after ERCP, and was found to have pancreatic insufficiency.   SOCIAL HISTORY: She denies EtOH abuse or smoking.   FAMILY HISTORY: Positive for coronary artery disease.   ALLERGIES: VICODIN, PENICILLIN, HYDROCODONE, LACTULOSE.  HOME MEDICATIONS:  She is on Xanax, Creon, MiraLAX, Nexium, Mucinex, tramadol,  vitamin D.   PHYSICAL EXAMINATION: GENERAL: She is alert, oriented x 3, in no acute distress.  VITAL SIGNS: Her blood pressure is 105/70, respirations 18, pulse 87, temperature 97.2, saturation 92.  HEENT: Exam revealed positive JVD. LUNGS: Good air entry. No rales.  ABDOMEN: Soft, nontender, positive bowel sounds.  EXTREMITIES: No pedal edema.  NEUROLOGIC: She appears to be intact.   Her EKG shows sinus rhythm, 91 beats per minute, nonspecific ST-T changes.   LABS: Her BUN is 47, creatinine is 2.58. Initially when she came in, her BUN was 28, creatinine 1.15. Hemoglobin is 14.7, hematocrit 43.6, white count is 10.5, platelet count 232.   ASSESSMENT AND PLAN: The patient has renal failure/congestive heart failure right now. She had normal creatinine when she first  came in. It has gone up most likely due to prerenal azotemia and accompanying congestive heart failure, with echocardiogram showing 30% to 35% ejection fraction, mild mitral regurgitation, septal akinesis. I suggest she has severe left ventricular dysfunction and wall motion abnormality due to coronary artery disease. She was given Lasix, but her BUN and creatinine have shot up significantly. Thus, at this time, cannot do catheterization. Will hold Lasix and digoxin and enalapril for now, and may see the patient in the office. Once BUN and creatinine recover, then will consider either doing CTA or cardiac catheterization. If the BUN and creatinine continue to be elevated, may evaluate by doing an outpatient Lexiscan Myoview. I think she can go home in the morning, with follow up in the office.   Thank you very much for the referral.    ____________________________ Dionisio David, MD sak:mr D: 10/03/2013 16:26:24 ET T: 10/03/2013 17:21:04 ET JOB#: [id number]  cc: Dionisio David, MD, <Dictator> Dionisio David MD ELECTRONICALLY SIGNED 10/04/2013 15:19

## 2015-03-31 NOTE — Discharge Summary (Signed)
PATIENT NAME:  Bridget Walker, Bridget Walker MR#:  [id number] DATE OF BIRTH:  1925-11-10  DATE OF ADMISSION:  12/04/2012 DATE OF DISCHARGE:    PRIMARY CARE PHYSICIAN:  At [facility].   FINAL DIAGNOSES: 1.  Septic shock with Enterobacter in the blood, Escherichia coli in the urine.  2.  Acute delirium. 3.  Acute respiratory failure.  4.  Pancreatic cancer, jaundice and elevated LFTs.  5.  History of congestive heart failure, no signs on this hospital stay.  6.  Hypokalemia and hypomagnesemia.   MEDICATIONS ON DISCHARGE:  Include tramadol 50 mg 1 tablet twice a day, Xanax 0.25 mg daily, Creon 36,000 units oral delay-release capsules 3 capsules 3 times a day with meals, prednisone 5 mg daily, Protonix 40 mg daily, levofloxacin 500 mg daily for 9 more days, Ensure 200 mL 3 times a day with meals, Dukes mouthwash 50 mL orally every 6 hours for 7 days.   Home health physical therapy. Oxygen 2 liters at night.   DIET: Regular diet, regular consistency.   ACTIVITY: Activity as tolerated.   FOLLOWUP:  With hospice, follow up with doctor at rehab.   REASON FOR ADMISSION: The patient was admitted 12/04/2012 and discharged 12/08/2012, came in with fever, respiratory distress, septic shock.   HISTORY OF PRESENT ILLNESS: This is an 81 year old female with a history of recently-diagnosed pancreatic cancer, under hospice care. She presented with a blood pressure in the 60s/40s, heart rate of 1230, respiratory rate of 34. She had some diarrhea. The patient was put on triple antibiotic therapy for the possibility of pneumonia; vancomycin, Levaquin and aztreonam. The patient was going to be admitted to the Intensive Care Unit and then no aggressive measures. The patient was brought to the floor. She had metabolic acidosis, elevated troponin and electrolyte abnormalities.   LABORATORY, DIAGNOSTIC AND RADIOLOGICAL DATA DURING THE HOSPITAL COURSE:  Included an EKG that showed sinus tachycardia, left atrial enlargement. A BNP of  7792. Troponin 1.4. White blood cell count 1.5, H and H 11.9 and 35.9, platelet count 174. Blood cultures grew out Enterobacter aerogenes. Chest x-ray showed congestive heart failure, pneumonitis cannot be excluded. ABG showed a pH of 7.56, pCO2 of less than 19, pO2 64, FiO2 40%, O2 oxygen saturation 96%. Lactic acid 4.8. Urine culture grew out E. coli. Urinalysis 1+ leukocyte esterase and bacteria. Influenza negative. INR 1.3. Magnesium 1.3, potassium 2.2, glucose 132, BUN 14, creatinine 0.86, sodium 140, chloride 107, CO2 19, calcium 8.1. Liver function tests elevated total bili of 5.9, alkaline phosphatase 296, ALT 100, AST 68, albumin 2.7. Repeat chest x-ray showed heart failure with bilateral pleural effusions, superimposed pneumonia cannot be excluded. White count on the 28th went up to 16.6. Repeat blood cultures on the 30th negative. White count upon discharge 9.1. Potassium upon discharge 3.7 but was as low as 2.9 on the day of discharge. Magnesium upon discharge 1.8.   HOSPITAL COURSE PER PROBLEM LIST:  1.  For the patient's septic shock. The patient was initially started on 3 antibiotics of vancomycin, aztreonam and Levaquin. The blood cultures grew out Enterobacter which is most likely a GI  source. Her pancreatic cancer could be the etiology. The patient did grow out E. coli in the urine. With the culture results, the vancomycin and aztreonam were stopped. The patient will be on 14-day course of Levaquin. Along with the septic shock, the patient did have lactic acidosis and elevated troponin. I do not believe this is a myocardial infarction secondary to the patient's septic  shock and no cardiac symptoms. The patient was started on stress dose steroids initially. The patient does take chronic prednisone for possibly chronic aspiration. The steroids needed to be stopped on the 30th secondary to acute delirium. The patient can go back on usual steroids afterwards. The patient's blood pressure upon  discharge is 120/77, heart rate 86, temperature 97.5. White count was initially leukopenic and then became elevated with a leukocytosis and upon discharge normal range, 9.1. The patient vastly improved from admission.  The patient is a DO NOT RESUSCITATE and followed by hospice.  2.  For the patient's acute delirium, I believe this is secondary to being in the hospital, being septic, and also IV steroids. The steroids were stopped and delirium cleared. She is stable to go out of the hospital on the 31st.  3.  Acute respiratory failure. Oxygen level actually 96% at rest. She does wear oxygen at home and can wear it at night.  4.  Pancreatic cancer, jaundice and elevated liver function tests. Overall prognosis is poor. Followed by hospice.  5.  History of congestive heart failure. No signs on this hospital stay. The patient was actually given IV fluids secondary to septic shock.  6.  Hypokalemia and hypomagnesemia. These were replaced during the hospital stay. Potassium actually replaced upon discharge, repeat level 3.7 of the potassium and magnesium is 1.8.   TIME SPENT ON DISCHARGE: 35 minutes.   The patient's overall prognosis is poor secondary to pancreatic cancer but patient is stable at this time with regards to recovering from septic shock.     ____________________________ Tana Conch. Leslye Peer, MD rjw:cs D: 12/08/2012 14:04:32 ET T: 12/08/2012 14:58:12 ET JOB#: [id number]  cc: Tana Conch. Leslye Peer, MD, <Dictator> [facility] Adella Hare MD ELECTRONICALLY SIGNED 12/10/2012 19:08

## 2015-04-01 NOTE — Consult Note (Signed)
General Aspect Bridget Walker is a 81 year old woman with history of large hiatal hernia, underlying pulmonary disease/COPD on chronic prednisone, chronic pancreatitis who lives at [facility],  admission to the hospital 10/01/2013 with discharge 10/04/2013 with shortness of breath, Cardiac catheterization showed no significant CAD, presenting yesterday with shaking chills, malaise, elevated troponin.  last seen in clinic at [facility] 12/2013.   Notes indicate there was a concern of CHF. Echocardiogram showed ejection fraction 30% with possible wall motion abnormality. She was given diuretics with subsequent acute renal failure.  creatinine increased from 1.15 up to 2.58. BUN increased from 28 to 47 . Diuretics were held, fluids initiated with improvement of her renal function back to her baseline . She had a cardiac catheterization that showed no significant CAD. This was done 10/04/2013 she was readmitted to the hospital 10/06/2013 for hypotension felt secondary to medications. Systolic pressure was 80. She was given fluids, Lasix was held temporarily. Hold parameters placed on her medications .   In followup today, she is [facility] and moving back to assisted living. She takes Lasix 20 mg daily. Her weight has been relatively stable at 126 pounds. Blood pressure has been running low but she denies any dizziness or orthostatic type symptoms. Minimal lower extremity edema. She does report having shortness of breath with exertion.   Echocardiogram from the hospital 10/01/2013 shows ejection fraction 30-35%, mention of possible anterior wall hypokinesis. This was not noted on cardiac catheterization.   Present Illness . SOCIAL HISTORY: Remote history of smoking, currently none. The patient is from assisted-living facility at [facility] Usually steady on her feet, though has a walker, walks independently.   FAMILY HISTORY: Both parents died when the patient was a toddler of unknown causes.  Two of her siblings have diabetes and one of them has heart disease as well.   Physical Exam:  GEN well developed, well nourished, no acute distress   HEENT hearing intact to voice, moist oral mucosa   NECK supple    RESP normal resp effort  clear BS    CARD Regular rate and rhythm  Murmur    Murmur Systolic    ABD denies tenderness  soft    LYMPH negative neck   EXTR negative edema   SKIN normal to palpation   NEURO motor/sensory function intact   PSYCH alert, A+O to time, place, person, good insight     CHF:    Hiatal Hernia:    Pancreatic Cancer: Apr 2013       Admit Diagnosis:   NON ST ELEVATION MYOCARDIAL INFARCTION: Onset Date: 23-Feb-2014, Status: Active, Description: NON ST ELEVATION MYOCARDIAL INFARCTION  Home Medications: Medication Instructions Status  NexIUM 40 mg oral delayed release capsule 1 cap(s) orally once a day (730 am) Active  Creon 3000 units-9500 units-15,000 units oral delayed release capsule 1 cap(s) orally 3 times a day (730 am, 1130 am, 430 pm) Active  predniSONE 5 mg oral tablet 2 tab(s) orally every other day alternating with 1 tablet (730 am) Active  Vitamin D2 50,000 intl units oral capsule 1 cap(s) orally once a week on Monday (730 am) Active  megestrol 40 mg/mL oral suspension 10 milliliter(s) orally once a day with evening meal (630 pm) Active  ALPRAZolam 0.25 mg oral tablet 1 tab(s) orally up to 3 times a day, As Needed for anxiety Active  MiraLax - oral powder for reconstitution 17 gram(s) in 8 ounces of water orally once a day, As Needed for  constipation Active  Mucinex 600 mg oral tablet, extended release 1 tab(s) orally once a day, As Needed for cough and increased mucus Active  traMADol 50 mg oral tablet 1 tab(s) orally 2 times a day (730 am, 8 pm) Active  aspirin 81 mg oral delayed release tablet 1 tab(s) orally once a day (730 am) Active  lisinopril 2.5 mg oral tablet 1 tab(s) orally once a day (730 am) Active  Calcarb with  D 600 mg-400 intl units oral tablet 2 tab(s) orally once a day (730 am) Active  predniSONE 5 mg oral tablet 1 tab(s) orally every other day alternating with 2 tablets (730 am) Active  furosemide 20 mg oral tablet 1 tab(s) orally once a day (730 am) Active  Klor-Con 10 mEq oral tablet, extended release 0.5 tab(s) (5 mEq) orally once a day (730 am) Active  metoprolol succinate 25 mg oral tablet, extended release 0.5 tab(s) (12.5 mg) orally once a day (730 am) Active  Tylenol Extra Strength 500 mg oral tablet 1-2 tab(s) orally every 6 hours, As Needed - for Pain Active  traMADol 50 mg oral tablet 1 tab(s) orally once a day, As Needed - for Pain Active  Systane Ultra - ophthalmic solution 1-2 drop(s) to each affected eye 2 times a day, As Needed for dry eyes Active  Systane - ophthalmic gel 1-2 drop(s) to each affected eye 2 times a day, As Needed for dry eyes Active   EKG:  Interpretation EKG today shows normal sinus rhythm with poor R-wave progression through the anterior precordial leads, left axis deviation   Radiology Results: XRay:    17-Mar-15 12:20, Chest Portable Single View  Chest Portable Single View   REASON FOR EXAM:    dyspnea  COMMENTS:       PROCEDURE: DXR - DXR PORTABLE CHEST SINGLE VIEW  - Feb 22 2014 12:20PM     CLINICAL DATA:  dyspnea, n/v anxiety    EXAM:  PORTABLE CHEST - 1 VIEW    COMPARISON:  DG CHEST 2V dated 10/05/2013; DG CHEST 1V PORT dated  12/05/2012    FINDINGS:  Low lung volumes. Cardiac silhouette is enlarged. There is increased  density projects within the retrocardiac region on the right an  air-fluid level is appreciated consistent with a hiatal hernia.  There is mild prominence of the interstitial markings well as mild  peribronchial cuffing. No focal regions of consolidation identified.  There is blunting of left costophrenic angle. The osseous structures  demonstrate no acute abnormalities.     IMPRESSION:  Low lung volumes. Differential  considerations of the interstitial  findings are a component of pulmonary edema with underlying  component of pulmonary fibrosis. Small right effusion versus chronic  scarring.      Electronically Signed    By: Ricarda Frame.D.    On: 02/22/2014 12:39         Verified By: Mikki Santee, M.D., MD    Penicillin: Other  Vicodin: Other  Lactose: Unknown   Impression Bridget Walker is a 81 year old woman with history of large hiatal hernia, underlying pulmonary disease/COPD on chronic prednisone, chronic pancreatitis who lives at [facility],  admission to the hospital 10/01/2013 with discharge 10/04/2013 with shortness of breath, Cardiac catheterization showed no significant CAD, presenting yesterday with shaking chills, malaise, elevated troponin, hypotension  1) Elevated cardiac enz: demand ischemia in setting of shaking chills/infection, hypotension Given recent cath with minimal CAD, no further workup indicated Bun elevated consistent with borderline/mild dehydration --As  BP low (90 systolic this Am), will hold all BP meds --start IVF at 75 per hour --blood cultures?  2) Chills/shaking/sepsis needs work up, blood cultures emperic ABX could be viral in etiology as no clear source Chronic systolic CHF (congestive heart failure) -   appears borderliner uvolemic. No changes to her medications       3)   COPD with emphysema -   She does have some baseline shortness of breath with exertion.   secondary to mild COPD, underlying chronic systolic cardiac dysfunction  no new sputum or SOB       4)  HYPERCHOLESTEROLEMIA -   High cholesterol she has recent cardiac catheterization showing no significant CAD. Given leg weakness, do not need to be very aggressive with her lipids with a statin   Electronic Signatures: Ida Rogue (MD)  (Signed 18-Mar-15 09:26)  Authored: General Aspect/Present Illness, History and Physical Exam, Past Medical History, Health Issues, Home Medications,  EKG , Radiology, Allergies, Impression/Plan   Last Updated: 18-Mar-15 09:26 by Ida Rogue (MD)

## 2015-04-01 NOTE — Discharge Summary (Signed)
PATIENT NAME:  Bridget Walker, Bridget Walker MR#:  [id number] DATE OF BIRTH:  Mar 02, 1925  DATE OF ADMISSION:  02/22/2014 DATE OF DISCHARGE:  02/25/2014   ADMITTING PHYSICIAN: Gladstone Lighter, MD  DISCHARGING PHYSICIAN: Gladstone Lighter, MD  PRIMARY CARE PHYSICIAN: Marne A. Tower, MD  PRIMARY CARDIOLOGIST: Minna Merritts, MD  [place]: Cardiology consultation by Dr. Rockey Situ.   DISCHARGE DIAGNOSES:  1. Dehydration and hypotension.  2. Chronic systolic congestive heart failure, ejection fraction of 30%.  3. Demand ischemia with elevated troponin, with negative cardiac catheterization in the past.  4. Possible chronic obstructive pulmonary disease with underlying pulmonary fibrosis.  5. Large hiatal hernia.  6. Gastroesophageal reflux disease.  7. Anxiety.  8. Pancreatic insufficiency.   DISCHARGE MEDICATIONS:  1. Nexium 40 mg p.o. daily.  2. Prednisone 10 mg every other day at 7:30 a.m.  3. Prednisone 5 mg every other day alternating with the 10 mg tablet.  4. Vitamin D2 50,000 international units once a week on Mondays.  5. Megace 40 mg/mL 10 mL once a day in the evening.  6. Xanax 0.25 mg 3 times a day as needed for anxiety.  7. MiraLax p.r.n. for constipation.  8. Aspirin 81 mg p.o. daily.  9. Calcium carbonate 600 mg/400 international units 2 tablets once a day.  10. Tylenol Extra Strength 500 mg 2000 mg every 6 hours as needed for pain.  11. Tramadol 50 mg daily p.r.n. for pain.  12. Systane ophthalmic gel 1 drop each eye twice a day.  13. Tramadol 50 mg p.o. b.i.d.  14. Coreg 3.125 mg p.o. b.i.d.  15. Diaper dermatitis ointment apply to affected area twice a day.  16. Creon 3000/9500/15,000 units delayed-release capsule 1 capsule orally 3 times a day.   DISCHARGE DIET: Low-sodium diet.   DISCHARGE ACTIVITY: As tolerated.    FOLLOWUP INSTRUCTIONS:  1. Follow up with Dr. Rockey Situ in 1 week.  2. PCP followup in 2 weeks.   LABORATORY AND IMAGING STUDIES PRIOR TO  DISCHARGE:  Blood cultures from 02/23/2014 negative. Echo Doppler showing LV ejection fraction is 25% to 30%, moderate to severely decreased global left ventricular systolic function, dilated left atrium and mild mitral valve regurgitation.  WBC 8.6, hemoglobin 11.7, hematocrit 34.4, platelet count 208.  Sodium 134, potassium 4.1, chloride 102, bicarbonate 29, BUN 26, creatinine 1.19, glucose 97 and calcium of 8.6.  CK-MB is 2.2. Troponins were elevated at 0.06, 0.38 and 0.75.  Chest x-ray on admission showing low lung volumes, possible pulmonary edema and pulmonary fibrosis.   BRIEF HOSPITAL COURSE: Bridget Walker is an 81 year old elderly Caucasian female from [facility] assisted living facility with past medical history significant for chronic systolic CHF, known EF of 30%, pulmonary fibrosis, COPD, gastroesophageal reflux disease and pancreatic insufficiency, who was brought in secondary to episode of shaking all over and was also noted to be hypotensive. The patient was noted to have an elevated troponin and was admitted for the same.   1. Elevated troponin, likely demand ischemia. During that episode of chills, the patient was a little hypoxic and was anxious, so actually initially it was thought to be non-STEMI because second and third sets were rising. After consulting cardiology, it seemed like the patient had a recent cardiac catheterization that revealed only minimal coronary artery disease and no occlusions seen. She was not treated with any IV anticoagulation, and she did not have any chest pain or EKG changes here. The patient has been doing fine from that standpoint. 2.  Low-normal blood pressure. The patient has severe cardiomyopathy with low EF and is expected to have low blood pressure. She was also only on minimal doses of medications as an outpatient for blood pressure, so I guess she always runs on the lower side. However, because of the shaking chill episode, infection needed to be ruled  out. Her UA was negative, and blood cultures were drawn which were negative. The patient has not had any febrile episodes or elevated white count. She was not treated with any antibiotics, and blood pressure has remained stable in the hospital.  3. Chronic systolic congestive heart failure with ejection fraction of 25% to 30%. No acute exacerbation. Lasix was held due to dehydration and hypotension at this time. Lisinopril is not being restarted due to hypotension. Her metoprolol is being changed over to Coreg. She will follow up with Dr. Rockey Situ as an outpatient in 1 week to reinstate the lisinopril and also Lasix if needed.  4. Pancreatic insufficiency. She is on pancreatic enzyme supplements.  5. Gastroesophageal reflux disease. The patient is on Nexium.  6. Her course has been otherwise uneventful in the hospital.   DISCHARGE CONDITION: Stable.   DISCHARGE DISPOSITION: To [name] skilled nursing facility.   CODE STATUS: Do not resuscitate.   TIME SPENT ON DISCHARGE: 45 minutes.   ____________________________ Gladstone Lighter, MD rk:lb D: 02/25/2014 08:46:33 ET T: 02/25/2014 09:18:35 ET JOB#: [id number]  cc: Gladstone Lighter, MD, <Dictator> Marne A. Glori Bickers, MD Minna Merritts, MD Gladstone Lighter MD ELECTRONICALLY SIGNED 03/03/2014 16:05

## 2015-04-01 NOTE — H&P (Signed)
PATIENT NAME:  Bridget Walker, Bridget Walker MR#:  [id number] DATE OF BIRTH:  [birth year]  DATE OF ADMISSION:  02/22/2014  ADMITTING PHYSICIAN: Gladstone Lighter, MD PRIMARY CARE PHYSICIAN: Loura Pardon, MD  PRIMARY CARDIOLOGIST: Ida Rogue, MD  CHIEF COMPLAINT: Shaking all over.   HISTORY OF PRESENT ILLNESS: Bridget Walker is an 81 year old pleasant, Caucasian female with past medical history significant for chronic systolic CHF, ejection fraction of 30%, hiatal  hernia, COPD, and pulmonary fibrosis on chronic steroids, gastroesophageal reflux disease, and pancreatic insufficiency who was brought in from [facility] secondary to an episode where she had shaking all over.   The patient says that she was fine this morning. She went to eat her breakfast. She had a sip of orange juice as she started to feel thirsty. As soon as she came her room, she started to feel like shaking chills, lying in her bed. There was a nurse from the [facility] who was right there.  Denied any seizure-kind of shaking and alerted the family.   The patient denies any chest pain all through this episode. No dyspnea. No fevers. She is completely comfortable at this time and back to baseline, and denies any chest pain even now. The shaking episode has stopped. She had a troponin bump of 0.06 initially when she came in, that increased up to 0.38, so she is being admitted for possible non-STEMI.  PAST MEDICAL HISTORY:  1.  Chronic systolic congestive heart failure, EF of 30%.  2.  Possible COPD with underlying pulmonary fibrosis.  3.  Large hiatal hernia.  4.  Gastroesophageal reflux disease.  5.  Anxiety.  6.  Pancreatic infection.   PAST SURGICAL HISTORY: Biliary stent placement and pancreatic biopsies.   ALLERGIES: VICODIN, PENICILLIN, HYDROCODONE, LACTULOSE.   CURRENT HOME MEDICATIONS:  1.  Xanax 0.25 mg up to 3 times a day as needed for anxiety.  2.  Aspirin 81 mg p.o. daily.  3.  Calcium carbonate 600/400 two  tablets once a day.  4.  Creon 3000/9500/15,000 units orally 1 capsule three times a day with meals.  5.  Lasix 20 mg p.o. daily.  6.  Klor-Con 10 mEq half tablet p.o. daily.  7.  Lisinopril 2.5 mg p.o. daily.  8.  Megace 40 mg/mL oral suspension 10 mL once daily in the evening with meals. 9.  Toprol 12.5 mg daily.  10.  MiraLax powder p.r.n. for constipation.  11.  Mucinex 600 mg p.o. daily p.r.n. for cough.  12.  Nexium 40 mg p.o. daily.  13.  Prednisone 5 mg every other day.  14.  Prednisone 10 mg every other day alternating with the 5 mg on the other days.  15.  Systane ophthalmic gel 1 to 2 drips each eye twice a day as needed for dry eyes. 16.  Systane ultra ophthalmic solution 1 to 2 drops each eye twice a day as needed for dry eyes.  17.  Tramadol 50 mg p.o. b.i.d.  18.  Tramadol 50 mg as needed for pain.  19.  Vitamin D2 at 50,000 international units once a week on Mondays.  20.  Tylenol Extra Strength capsule 500 mg 1 to 2 tablets orally as needed for pain.   SOCIAL HISTORY: Remote history of smoking, currently none. The patient is from assisted-living facility at [facility]. Usually steady on her feet, though has a walker, walks independently.   FAMILY HISTORY: Both parents died when the patient was a toddler of unknown causes. Two of her  siblings have diabetes and one of them has heart disease as well.  REVIEW OF SYSTEMS:  CONSTITUTIONAL: No fever, fatigue or weakness.  EYES: No blurred vision, double vision, inflammation or glaucoma.  ENT: No tinnitus, ear pain, hearing loss, epistaxis or discharge.  RESPIRATORY: No cough, wheeze, hemoptysis or COPD.  CARDIOVASCULAR: No chest pain, orthopnea, edema, arrhythmia, palpitations, or syncope.  GASTROINTESTINAL: No nausea, vomiting, diarrhea, abdominal pain, hematemesis, or melena.  GENITOURINARY: No dysuria, hematuria, renal calculus, frequency or incontinence.  ENDOCRINE: No polyuria, nocturia, thyroid problems, heat or cold  intolerance.  HEMATOLOGY: No anemia, easy bruising or bleeding.  SKIN: No acne, rash or lesions.  MUSCULOSKELETAL: No neck, back, shoulder pain, arthritis or gout.  NEUROLOGIC: No numbness, weakness, CVA, TIA, or seizures.  PSYCHOLOGICAL: No anxiety, insomnia, or depression.   PHYSICAL EXAMINATION:  VITAL SIGNS: Temperature 97.8 degrees Fahrenheit, pulse 96, respirations 22, blood pressure 99/56,  pulse oximetry 87% on room air.  GENERAL: A well-built, well-nourished female lying in bed, not in any acute distress.  HEENT: Normocephalic, atraumatic. Pupils equal, round, reacting to light. Anicteric sclerae. Extraocular movements intact. Oropharynx clear without erythema, mass or exudates.  NECK: Supple. No thyromegaly, JVD, or carotid bruits. No lymphadenopathy.  LUNGS: Moving air bilaterally. Fine bibasilar crackles present. No wheezing. No rhonchi. No use of accessory muscles for breathing.  CARDIOVASCULAR: S1, S2, regular rate and rhythm. No murmurs, rubs, or gallops.  ABDOMEN: Soft, nontender, nondistended. No hepatosplenomegaly. Normal bowel sounds.  EXTREMITIES: No pedal edema. No clubbing or cyanosis, 2+ dorsalis pedis pulses palpable bilaterally.  SKIN: No acne, rash or lesions except for some petechiae noted on the lower extremities which is chronic while she is on aspirin.  NEUROLOGIC: Cranial nerves II through XII remain intact. No gross motor or sensory deficits.  PSYCHOLOGICAL: Awake, alert, oriented x3.   LABORATORY DATA: WBC 6.5, hemoglobin 11.5, hematocrit 36.0, platelet count 219. Sodium 133, potassium 4.4, chloride 103, bicarbonate 24, BUN 32, creatinine 1.16, glucose 172,  and calcium of 8.7. First troponin is 0.06. Urinalysis negative for any infection. CK-MB is 1.3. Troponin, second set 0.38.   A chest x-ray showing low lung volumes, interstitial findings, area of pulmonary edema with underlying pulmonary fibrosis. Small right effusion versus chronic scarring is  present.  EKG showing sinus tachycardia, heart rate of 101. No acute ST-T-wave abnormalities seen at this time. She has peak T waves only in lead V2.   ASSESSMENT AND PLAN: This is an 81 year old female with past medical history significant for systolic congestive heart failure, ejection fraction of 30%, from [facility], brought in for shaking chills, noted to have elevated troponin.   1.  Elevated troponin. Could it be ST-segment elevation myocardial infarction versus demand ischemia as the patient was hypoxic when she came in? Second set is much elevated. The patient appears comfortable without any chest pain. No significant EKG changes. Will admit her to telemetry, recycle troponin, hold off on IV heparin and get cardiology consult by Dr. Rockey Situ. Follow up with an echocardiogram and continue her aspirin at this time. Also, patient on Toprol.  2.  Chronic systolic congestive heart failure. Check BNP. Could there be an acute exacerbation as chest x-ray showing pulmonary edema; however, the patient does have underlying fibrosis. Continue her prednisone. Increase Lasix dose for today and monitor.  3.  Pancreatic insufficiency. Continue her pancreatic enzymes.  4.  Chronic obstructive pulmonary disease underlying pulmonary fibrosis. Continue prednisone.  5.  Gastroesophageal reflux disease and hiatal hernia. The  patient on proton pump inhibitor.   CODE STATUS: DO NOT RESUSCITATE. SHE HAS AN OUT OF FACILITY  DNR FORM SIGNED AND PLACED IN HER CHART.   TIME SPENT ON ADMISSION: 50 minutes.    ____________________________ Gladstone Lighter, MD rk:np D: 02/22/2014 15:40:00 ET T: 02/22/2014 16:47:40 ET JOB#: [id number]  cc: Minna Merritts, MD Wynelle Fanny. Glori Bickers, MD Gladstone Lighter, MD, <Dictator>   Gladstone Lighter MD ELECTRONICALLY SIGNED 03/15/2014 12:45

## 2015-04-03 ENCOUNTER — Ambulatory Visit: Payer: Medicare Other | Admitting: Cardiovascular Disease

## 2015-04-07 ENCOUNTER — Other Ambulatory Visit: Payer: Self-pay | Admitting: Family Medicine

## 2015-04-07 NOTE — Telephone Encounter (Signed)
Rx declined due to being filled in Feb.

## 2015-04-17 DIAGNOSIS — E119 Type 2 diabetes mellitus without complications: Secondary | ICD-10-CM | POA: Diagnosis not present

## 2015-04-17 DIAGNOSIS — I5022 Chronic systolic (congestive) heart failure: Secondary | ICD-10-CM | POA: Diagnosis not present

## 2015-04-19 ENCOUNTER — Encounter: Payer: Self-pay | Admitting: Family Medicine

## 2015-04-26 ENCOUNTER — Ambulatory Visit (INDEPENDENT_AMBULATORY_CARE_PROVIDER_SITE_OTHER): Payer: Medicare Other | Admitting: Family Medicine

## 2015-04-26 ENCOUNTER — Encounter: Payer: Self-pay | Admitting: Family Medicine

## 2015-04-26 VITALS — BP 126/80 | HR 76 | Temp 97.9°F | Ht 59.0 in | Wt 122.5 lb

## 2015-04-26 DIAGNOSIS — F068 Other specified mental disorders due to known physiological condition: Secondary | ICD-10-CM

## 2015-04-26 DIAGNOSIS — I951 Orthostatic hypotension: Secondary | ICD-10-CM

## 2015-04-26 DIAGNOSIS — E119 Type 2 diabetes mellitus without complications: Secondary | ICD-10-CM

## 2015-04-26 DIAGNOSIS — F0390 Unspecified dementia without behavioral disturbance: Secondary | ICD-10-CM

## 2015-04-26 MED ORDER — DONEPEZIL HCL 10 MG PO TABS: 10.0000 mg | ORAL_TABLET | Freq: Every day | ORAL | Status: DC

## 2015-04-26 NOTE — Patient Instructions (Signed)
Increase aricept to 10 mg once daily at bedtime Continue to monitor blood pressure - if low and pt is symptomatic please let me know  Follow up in 6 mo with labs prior

## 2015-04-26 NOTE — Assessment & Plan Note (Signed)
Pt has had 2 low bp at [facility] with no symptoms  Claims to feel good  Will f/u with cardiology soon and eval further

## 2015-04-26 NOTE — Progress Notes (Signed)
Subjective:    Patient ID: Bridget Walker, female    DOB: Jul 27, 1925, 81 y.o.   MRN: [id number]  HPI Here for f/u of chronic medical problems   Wt is down 2 lb with bmi of 24  She goes to the dining room for meals and enjoys it - does 3 meals per day and eats regularly   BP Readings from Last 3 Encounters:  04/26/15 126/80  02/21/15 116/70  02/01/15 116/72    At [facility] -had a few low bp readings - pt does not recall feeling weak or dizzy at all   No falls  Has f/u with Dr Rockey Situ next month   Dementia -she did start aricept 5 mg - no problems with it  No side effects at all  Thinks her memory is the same  She continues to read books and the newspaper and socializes a lot   Has a bruise from holding great grandchild on her arm -does not bother her   DM2 Improved A1C Lab Results  Component Value Date   HGBA1C 7.0* 01/18/2015   then improved at 6.4  04/17/15  [facility] does offer a lower carb diet and she is active and not overeating   utd eye exam 2/16 utd foot exam      Review of Systems Review of Systems  Constitutional: Negative for fever, appetite change, fatigue and unexpected weight change.  Eyes: Negative for pain and visual disturbance.  Respiratory: Negative for cough and shortness of breath.   Cardiovascular: Negative for cp or palpitations    Gastrointestinal: Negative for nausea, diarrhea and constipation.  Genitourinary: Negative for urgency and frequency.  Skin: Negative for pallor or rash   Neurological: Negative for weakness, light-headedness, numbness and headaches.  Hematological: Negative for adenopathy. Does not bruise/bleed easily.  Psychiatric/Behavioral: Negative for dysphoric mood. The patient is occ anxious (improved)- and pos for loss of short term memory        Objective:   Physical Exam  Constitutional: She appears well-developed and well-nourished. No distress.  Well appearing  Ambulates with wheeled walker   HENT:  Head:  Normocephalic and atraumatic.  Mouth/Throat: Oropharynx is clear and moist.  Eyes: Conjunctivae and EOM are normal. Pupils are equal, round, and reactive to light.  Neck: Normal range of motion. Neck supple. No JVD present. Carotid bruit is not present. No thyromegaly present.  Cardiovascular: Normal rate, regular rhythm and intact distal pulses.  Exam reveals gallop.   Pulmonary/Chest: Effort normal and breath sounds normal. No respiratory distress. She has no wheezes. She has no rales.  No crackles  Diffusely distant bs   Abdominal: Soft. Bowel sounds are normal. She exhibits no distension, no abdominal bruit and no mass. There is no tenderness.  Musculoskeletal: She exhibits no edema.  Mild kyphosis   Lymphadenopathy:    She has no cervical adenopathy.  Neurological: She is alert. She has normal reflexes.  Skin: Skin is warm and dry. No rash noted.  Large /old ecchymosis on L forearm from holding a large baby-nontender   Psychiatric: She has a normal mood and affect.  Quite mentally sharp today  Asks appropriate questions           Assessment & Plan:   Problem List Items Addressed This Visit    Dementia arising in the senium and presenium    Doing well with aricept 5 mg -stable and no side eff Inc to 10 mg daily today  udpate if side eff (rev-such  as nausea) Continue activity and socialization  F/u 6 mo      Relevant Medications   donepezil (ARICEPT) 10 MG tablet   Diabetes type 2, controlled - Primary    Last A1C at [facility] is 6.4 -excellent with carb controlled diet Will continue to follow F/u 6 mo      Orthostatic hypotension    Pt has had 2 low bp at [facility] with no symptoms  Claims to feel good  Will f/u with cardiology soon and eval further

## 2015-04-26 NOTE — Assessment & Plan Note (Signed)
Last A1C at [facility] is 6.4 -excellent with carb controlled diet Will continue to follow F/u 6 mo

## 2015-04-26 NOTE — Assessment & Plan Note (Signed)
Doing well with aricept 5 mg -stable and no side eff Inc to 10 mg daily today  udpate if side eff (rev-such as nausea) Continue activity and socialization  F/u 6 mo

## 2015-04-26 NOTE — Progress Notes (Signed)
Pre visit review using our clinic review tool, if applicable. No additional management support is needed unless otherwise documented below in the visit note. 

## 2015-05-01 ENCOUNTER — Ambulatory Visit: Payer: Medicare Other | Admitting: Cardiovascular Disease

## 2015-05-09 ENCOUNTER — Other Ambulatory Visit: Payer: Self-pay | Admitting: Family Medicine

## 2015-05-30 ENCOUNTER — Ambulatory Visit (INDEPENDENT_AMBULATORY_CARE_PROVIDER_SITE_OTHER): Payer: Medicare Other | Admitting: Cardiovascular Disease

## 2015-05-30 ENCOUNTER — Encounter: Payer: Self-pay | Admitting: Cardiovascular Disease

## 2015-05-30 VITALS — BP 139/72 | HR 75 | Ht 60.0 in | Wt 123.2 lb

## 2015-05-30 DIAGNOSIS — I5022 Chronic systolic (congestive) heart failure: Secondary | ICD-10-CM

## 2015-05-30 DIAGNOSIS — J432 Centrilobular emphysema: Secondary | ICD-10-CM

## 2015-05-30 DIAGNOSIS — E7800 Pure hypercholesterolemia, unspecified: Secondary | ICD-10-CM

## 2015-05-30 NOTE — Assessment & Plan Note (Signed)
She appears relatively euvolemic on her current medication regimen. Unclear if she is receiving Lasix. Was previously held for weight less than 130 pounds. Current weight 123 pounds.

## 2015-05-30 NOTE — Assessment & Plan Note (Signed)
Stable respiratory status 

## 2015-05-30 NOTE — Patient Instructions (Signed)
You are doing well. No medication changes were made.  Please call us if you have new issues that need to be addressed before your next appt.  Your physician wants you to follow-up in: 6 months.  You will receive a reminder letter in the mail two months in advance. If you don't receive a letter, please call our office to schedule the follow-up appointment.   

## 2015-05-30 NOTE — Assessment & Plan Note (Signed)
Currently not on a statin °

## 2015-05-30 NOTE — Progress Notes (Signed)
Patient ID: Bridget Walker, female    DOB: [birth year], 81 y.o.   MRN: [id number]  HPI Comments: Bridget Walker is a 81 year old woman with history of large hiatal hernia, underlying pulmonary disease/COPD on chronic prednisone, chronic pancreatitis who lives at [facility],  admission to the hospital 10/01/2013 with discharge 10/04/2013 with shortness of breath. Diagnosed with cardiomyopathy .Cardiac catheterization 10/04/2013 showed no significant CAD  Echocardiogram showed ejection fraction 30%. She was given diuretics with subsequent acute renal failure.  creatinine increased from 1.15 up to 2.58. BUN increased from 28 to 47 . Diuretics were held, fluids initiated with improvement of her renal function back to her baseline . readmitted to the hospital 10/06/2013 for hypotension felt secondary to medications. Systolic pressure was 80. She was given fluids, Lasix was held temporarily. Hold parameters placed on her medications .  In follow-up today, she reports that she is doing well. She lives in a nursing home, walks with a walker Daughter presents with her today. No further issues with urinary tract infection Unclear she is taking Lasix as full medication list and dosing not provided Previously we were holding Lasix for weight less than 130 pounds Weight is down 4 pounds from her prior clinic visit, now 123 pounds. Prior visit 127 pounds In general she reports that she is doing well  EKG on today's visit shows normal sinus rhythm with rate 75 bpm, no significant ST or T-wave changes  Other past medical history Echocardiogram from the hospital 10/01/2013 shows ejection fraction 30-35%, mention of possible anterior wall hypokinesis. This was not noted on cardiac catheterization. Repeat echocardiogram 02/23/2014 shows ejection fraction 25-30%, mildly elevated right ventricular systolic pressures, mild aortic valve stenosi for elevated cardiac enzymes     Allergies  Allergen Reactions  . Lactose  Intolerance (Gi)     Pt can drink skim milk  . Hydrocodone Rash    Patient unsure of reaction, but it may have been diarrhea.  . Penicillins Rash    On scalp    Current Outpatient Prescriptions on File Prior to Visit  Medication Sig Dispense Refill  . aspirin EC 81 MG tablet Take 81 mg by mouth daily.    . Calcium Carbonate-Vitamin D (CALTRATE 600+D PO) Take 2 tablets by mouth daily.    . carvedilol (COREG) 3.125 MG tablet Take 1 tablet (3.125 mg total) by mouth 2 (two) times daily with a meal. 180 tablet 3  . CREON 3000-9500 UNITS CPEP TAKE 1 CAPSULE THREE TIMES A DAY 270 capsule 3  . donepezil (ARICEPT) 10 MG tablet Take 1 tablet (10 mg total) by mouth at bedtime. 30 tablet 11  . ergocalciferol (VITAMIN D2) 50000 UNITS capsule Take 50,000 Units by mouth once a week.    . furosemide (LASIX) 20 MG tablet TAKE 1 TABLET DAILY (Patient taking differently: TAKE 1 TABLET EVERY OTHER DAY.) 90 tablet 3  . KLOR-CON M10 10 MEQ tablet TAKE 1 TABLET DAILY 90 tablet 3  . lisinopril (PRINIVIL,ZESTRIL) 2.5 MG tablet TAKE 1 TABLET DAILY 90 tablet 3  . NEXIUM 40 MG capsule TAKE 1 CAPSULE DAILY BEFORE BREAKFAST 90 capsule 1  . NON FORMULARY Oxygen 2 liters at bedtimes.    Vladimir Faster Glycol-Propyl Glycol (SYSTANE) 0.4-0.3 % GEL Apply to eye. 1-2 drops in each eye BID prn    . polyethylene glycol powder (GLYCOLAX/MIRALAX) powder Take 17 g by mouth daily as needed. 527 g 3  . predniSONE (DELTASONE) 5 MG tablet Alternate 10 mg with 5 mg every  other day 50 tablet 3  . sertraline (ZOLOFT) 25 MG tablet TAKE 1 TABLET (25 MG TOTAL) BY MOUTH DAILY. TAKE AT DINNER TIME 90 tablet 3  . sodium chloride (OCEAN) 0.65 % SOLN nasal spray Place 1 spray into both nostrils as needed for congestion.    . traMADol (ULTRAM) 50 MG tablet Take 1 tablet (50 mg total) by mouth 2 (two) times daily. 180 tablet 0   No current facility-administered medications on file prior to visit.    Past Medical History  Diagnosis Date  .  Osteoporosis   . Hyperlipidemia   . RLS (restless legs syndrome)   . Vitamin D deficiency   . GERD (gastroesophageal reflux disease)   . Cerumen impaction     recurrent  . Unexplained weight loss   . Cough   . Constipation   . Diarrhea   . Shingles   . Overactive bladder   . Cancer     pancreatic  . Arthritis     OA, in hands  . CHF (congestive heart failure)   . Pancreatic insufficiency   . Hiatal hernia     Past Surgical History  Procedure Laterality Date  . Cataract extraction      patient unsure of date procedure was done.  . Cataract extraction  11/08    2nd cataract  . Ercp  03/24/2012    Procedure: ENDOSCOPIC RETROGRADE CHOLANGIOPANCREATOGRAPHY (ERCP);  Surgeon: Milus Banister, MD;  Location: [facility];  Service: Endoscopy;  Laterality: N/A;  . Eus  04/02/2012    Procedure: ESOPHAGEAL ENDOSCOPIC ULTRASOUND (EUS) RADIAL;  Surgeon: Milus Banister, MD;  Location: [facility];  Service: Endoscopy;  Laterality: N/A;  EUS with FNA  . Fine needle aspiration  04/02/2012    Procedure: FINE NEEDLE ASPIRATION (FNA) LINEAR;  Surgeon: Milus Banister, MD;  Location: [facility];  Service: Endoscopy;  Laterality: N/A;  . Endoscopic retrograde cholangiopancreatography (ercp) with propofol N/A 01/28/2013    Procedure: ENDOSCOPIC RETROGRADE CHOLANGIOPANCREATOGRAPHY (ERCP) WITH PROPOFOL;  Surgeon: Milus Banister, MD;  Location: [facility];  Service: Endoscopy;  Laterality: N/A;  . Cardiac catheterization  10/14    [facility];no stent    Social History  reports that she quit smoking about 23 years ago. Her smoking use included Cigarettes. She has a 5 pack-year smoking history. She has never used smokeless tobacco. She reports that she does not drink alcohol or use illicit drugs.  Family History family history includes Alcohol abuse in her son; Cancer in her other; Diabetes in her brother and sister; Fibromyalgia in her daughter; Heart disease in her brother, brother, and mother; Heart  failure in her father; Hypertension in her sister; Pneumonia in her father.   Review of Systems  Constitutional: Negative.   HENT: Negative.   Eyes: Negative.   Respiratory: Negative.   Cardiovascular: Negative.   Gastrointestinal: Negative.   Endocrine: Negative.   Musculoskeletal: Positive for gait problem.  Skin: Negative.   Allergic/Immunologic: Negative.   Neurological: Negative.   Hematological: Negative.   Psychiatric/Behavioral: Negative.   All other systems reviewed and are negative.   BP 139/72 mmHg  Pulse 75  Ht 5' (1.524 m)  Wt 123 lb 4 oz (55.906 kg)  BMI 24.07 kg/m2  Physical Exam  Constitutional: She is oriented to person, place, and time. She appears well-developed and well-nourished.  Unstable gait  HENT:  Head: Normocephalic.  Nose: Nose normal.  Mouth/Throat: Oropharynx is clear and moist.  Eyes: Conjunctivae are normal. Pupils are equal,  round, and reactive to light.  Neck: Normal range of motion. Neck supple. No JVD present.  Cardiovascular: Normal rate, regular rhythm, S1 normal, S2 normal, normal heart sounds and intact distal pulses.  Exam reveals no gallop and no friction rub.   No murmur heard. Trace lower extremity edema bilaterally  Pulmonary/Chest: Effort normal and breath sounds normal. No respiratory distress. She has no wheezes. She has no rales. She exhibits no tenderness.  Abdominal: Soft. Bowel sounds are normal. She exhibits no distension. There is no tenderness.  Musculoskeletal: Normal range of motion. She exhibits no edema or tenderness.  Lymphadenopathy:    She has no cervical adenopathy.  Neurological: She is alert and oriented to person, place, and time. Coordination normal.  Skin: Skin is warm and dry. No rash noted. No erythema.  Psychiatric: She has a normal mood and affect. Her behavior is normal. Judgment and thought content normal.    Assessment and Plan  Nursing note and vitals reviewed.

## 2015-06-05 ENCOUNTER — Telehealth: Payer: Self-pay

## 2015-06-05 ENCOUNTER — Other Ambulatory Visit: Payer: Self-pay

## 2015-06-05 MED ORDER — LORATADINE 10 MG PO TABS: 10.0000 mg | ORAL_TABLET | Freq: Every day | ORAL | Status: DC | PRN

## 2015-06-05 NOTE — Telephone Encounter (Signed)
Order faxed and scanned.

## 2015-06-05 NOTE — Telephone Encounter (Signed)
[facility] Fax the order to [facility]

## 2015-06-05 NOTE — Telephone Encounter (Signed)
[name] nurse with [facility] assisted living left v/m; pt having post nasal drip with clear drainage and both eyes itching and irritated; no redness or drainage from eyes. No fever or  Sneezing.  Leafy Ro said does not see any signs of infections; no redness or crusting of eyes. Leafy Ro thinks allergy related. Pt presently using Systane gel eye drops; instill 1 - 2 gtts in affected eye daily prn; [name] request to increase order for Systane gel eye drops 1 -2 drops in affected eyes up to 4 x a day prn and request allergy med be ordered as well such as Claritin. [name] request cb and med sent to [facility]; pt is out of town at present time and would need [facility] to deliver. [name] out of office today.

## 2015-06-06 DIAGNOSIS — H01115 Allergic dermatitis of left lower eyelid: Secondary | ICD-10-CM | POA: Diagnosis not present

## 2015-06-06 DIAGNOSIS — H01111 Allergic dermatitis of right upper eyelid: Secondary | ICD-10-CM | POA: Diagnosis not present

## 2015-06-06 DIAGNOSIS — H01112 Allergic dermatitis of right lower eyelid: Secondary | ICD-10-CM | POA: Diagnosis not present

## 2015-06-06 DIAGNOSIS — H01114 Allergic dermatitis of left upper eyelid: Secondary | ICD-10-CM | POA: Diagnosis not present

## 2015-06-19 ENCOUNTER — Other Ambulatory Visit: Payer: Self-pay | Admitting: Cardiovascular Disease

## 2015-06-19 ENCOUNTER — Ambulatory Visit: Payer: Medicare Other | Admitting: Internal Medicine

## 2015-06-22 DIAGNOSIS — H10413 Chronic giant papillary conjunctivitis, bilateral: Secondary | ICD-10-CM | POA: Diagnosis not present

## 2015-07-03 ENCOUNTER — Ambulatory Visit: Payer: Medicare Other | Admitting: Internal Medicine

## 2015-07-06 ENCOUNTER — Other Ambulatory Visit: Payer: Self-pay

## 2015-07-06 MED ORDER — TRAMADOL HCL 50 MG PO TABS: 50.0000 mg | ORAL_TABLET | Freq: Two times a day (BID) | ORAL | Status: DC

## 2015-07-06 NOTE — Telephone Encounter (Signed)
Px written for call in   

## 2015-07-06 NOTE — Telephone Encounter (Signed)
Pt daughter is requesting that [facility] because it's [facility] and also send in a small amount of tramadol to the [facility] (2 weeks supply) to last her until she gets the Rx through mail order, I called and cancelled the Rx for #180 with pharmacist, since it's a mail order pharmacy we would need a paper Rx faxed to them when you return tomorrow, I did advise daughter that [facility] is who [facility], daughter said that was an error and that she will let them know that Rxs need to be sent to mail order in future, please advise

## 2015-07-06 NOTE — Telephone Encounter (Signed)
[name] with [facility] assisted living left v/m; pt needs refill of tramadol to [facility]; call pts daughter [name] at [phone] when ready for pick up. rx last printed # 180 on 02/13/15. Last seen 04/26/15.

## 2015-07-07 MED ORDER — TRAMADOL HCL 50 MG PO TABS: 50.0000 mg | ORAL_TABLET | Freq: Two times a day (BID) | ORAL | Status: DC

## 2015-07-07 NOTE — Telephone Encounter (Signed)
Printed to fax Please call in smaller amt  Thanks

## 2015-07-07 NOTE — Telephone Encounter (Signed)
Rx called in as prescribed and 3 months supply sent to [facility]) and daughter [name] notified

## 2015-07-26 ENCOUNTER — Ambulatory Visit (INDEPENDENT_AMBULATORY_CARE_PROVIDER_SITE_OTHER): Payer: Medicare Other | Admitting: Primary Care

## 2015-07-26 ENCOUNTER — Encounter: Payer: Self-pay | Admitting: Primary Care

## 2015-07-26 VITALS — BP 122/72 | HR 77 | Temp 97.9°F | Ht 60.0 in | Wt 120.1 lb

## 2015-07-26 DIAGNOSIS — R319 Hematuria, unspecified: Secondary | ICD-10-CM | POA: Diagnosis not present

## 2015-07-26 DIAGNOSIS — N390 Urinary tract infection, site not specified: Secondary | ICD-10-CM | POA: Diagnosis not present

## 2015-07-26 LAB — POCT URINALYSIS DIPSTICK
Bilirubin, UA: NEGATIVE
GLUCOSE UA: NEGATIVE
Ketones, UA: NEGATIVE
Leukocytes, UA: NEGATIVE
NITRITE UA: POSITIVE
PH UA: 6
Protein, UA: NEGATIVE
Spec Grav, UA: >=1.03
Urobilinogen, UA: NEGATIVE

## 2015-07-26 MED ORDER — SULFAMETHOXAZOLE-TRIMETHOPRIM 800-160 MG PO TABS: 1.0000 | ORAL_TABLET | Freq: Two times a day (BID) | ORAL | Status: DC

## 2015-07-26 NOTE — Progress Notes (Signed)
Subjective:    Patient ID: Bridget Walker, female    DOB: 02/17/1925, 81 y.o.   MRN: [id number]  HPI  Bridget Walker is an 81 year old female who presents today with a chief complaint of agitation. She resides at [facility] assisted [facility] and her daughter is present with her during exam. The nursing staff at [facility] reports over the past week she's had increased agitation, is wanting to sleep most of the day, and not is wanting to take her medications.   She denies having difficulty swallowing, dysuria, frequency, urgency, choking. She endorses that she take 9 pills at once and that 2 of her pills are "horse pills". She is told to take her pills one right after the other without a break in between which will cause discomfort. She is alert and oriented during her exam.  Review of Systems  Constitutional: Negative for fever and chills.  HENT: Negative for trouble swallowing.   Respiratory: Negative for shortness of breath.   Cardiovascular: Negative for chest pain.  Genitourinary: Negative for dysuria, urgency and frequency.       Past Medical History  Diagnosis Date  . Osteoporosis   . Hyperlipidemia   . RLS (restless legs syndrome)   . Vitamin D deficiency   . GERD (gastroesophageal reflux disease)   . Cerumen impaction     recurrent  . Unexplained weight loss   . Cough   . Constipation   . Diarrhea   . Shingles   . Overactive bladder   . Cancer     pancreatic  . Arthritis     OA, in hands  . CHF (congestive heart failure)   . Pancreatic insufficiency   . Hiatal hernia     Social History   Social History  . Marital Status: Widowed    Spouse Name: N/A  . Number of Children: 3  . Years of Education: N/A   Occupational History  .      RETIRED    Social History Main Topics  . Smoking status: Former Smoker -- 0.25 packs/day for 20 years    Types: Cigarettes    Quit date: 03/23/1992  . Smokeless tobacco: Never Used     Comment: PT SMOKED 2 CIGARETTES A DAY   .  Alcohol Use: No  . Drug Use: No  . Sexual Activity: No     Comment: not applicable   Other Topics Concern  . Not on file   Social History Narrative   Resides at [facility] in [place]   Widow    Past Surgical History  Procedure Laterality Date  . Cataract extraction      patient unsure of date procedure was done.  . Cataract extraction  11/08    2nd cataract  . Ercp  03/24/2012    Procedure: ENDOSCOPIC RETROGRADE CHOLANGIOPANCREATOGRAPHY (ERCP);  Surgeon: Milus Banister, MD;  Location: [facility];  Service: Endoscopy;  Laterality: N/A;  . Eus  04/02/2012    Procedure: ESOPHAGEAL ENDOSCOPIC ULTRASOUND (EUS) RADIAL;  Surgeon: Milus Banister, MD;  Location: [facility];  Service: Endoscopy;  Laterality: N/A;  EUS with FNA  . Fine needle aspiration  04/02/2012    Procedure: FINE NEEDLE ASPIRATION (FNA) LINEAR;  Surgeon: Milus Banister, MD;  Location: [facility];  Service: Endoscopy;  Laterality: N/A;  . Endoscopic retrograde cholangiopancreatography (ercp) with propofol N/A 01/28/2013    Procedure: ENDOSCOPIC RETROGRADE CHOLANGIOPANCREATOGRAPHY (ERCP) WITH PROPOFOL;  Surgeon: Milus Banister,  MD;  Location: [facility];  Service: Endoscopy;  Laterality: N/A;  . Cardiac catheterization  10/14    [facility];no stent    Family History  Problem Relation Age of Onset  . Diabetes Sister   . Hypertension Sister   . Heart disease Brother   . Fibromyalgia Daughter   . Alcohol abuse Son   . Heart disease Brother   . Diabetes Brother   . Heart disease Mother   . Pneumonia Father   . Heart failure Father   . Cancer Other     niece had breast and bone cancer    Allergies  Allergen Reactions  . Lactose Intolerance (Gi)     Pt can drink skim milk  . Hydrocodone Rash    Patient unsure of reaction, but it may have been diarrhea.  . Penicillins Rash    On scalp    Current Outpatient Prescriptions on File Prior to Visit  Medication Sig  Dispense Refill  . ALPRAZolam (XANAX) 0.5 MG tablet Take 0.5 mg by mouth 3 (three) times daily as needed for anxiety.    Marland Kitchen aspirin EC 81 MG tablet Take 81 mg by mouth daily.    . Calcium Carbonate-Vitamin D (CALTRATE 600+D PO) Take 2 tablets by mouth daily.    . carvedilol (COREG) 3.125 MG tablet TAKE 1 TABLET TWICE A DAY WITH A MEAL 180 tablet 3  . CREON 3000-9500 UNITS CPEP TAKE 1 CAPSULE THREE TIMES A DAY 270 capsule 3  . donepezil (ARICEPT) 10 MG tablet Take 1 tablet (10 mg total) by mouth at bedtime. 30 tablet 11  . ergocalciferol (VITAMIN D2) 50000 UNITS capsule Take 50,000 Units by mouth once a week.    . furosemide (LASIX) 20 MG tablet TAKE 1 TABLET DAILY (Patient taking differently: TAKE 1 TABLET EVERY OTHER DAY.) 90 tablet 3  . KLOR-CON M10 10 MEQ tablet TAKE 1 TABLET DAILY 90 tablet 3  . lisinopril (PRINIVIL,ZESTRIL) 2.5 MG tablet TAKE 1 TABLET DAILY 90 tablet 3  . loratadine (CLARITIN) 10 MG tablet Take 1 tablet (10 mg total) by mouth daily as needed for allergies. 30 tablet 11  . NEXIUM 40 MG capsule TAKE 1 CAPSULE DAILY BEFORE BREAKFAST 90 capsule 1  . NON FORMULARY Oxygen 2 liters at bedtimes.    Vladimir Faster Glycol-Propyl Glycol (SYSTANE) 0.4-0.3 % GEL Apply to eye. 1-2 drops in each eye BID prn    . polyethylene glycol powder (GLYCOLAX/MIRALAX) powder Take 17 g by mouth daily as needed. 527 g 3  . predniSONE (DELTASONE) 5 MG tablet Alternate 10 mg with 5 mg every other day 50 tablet 3  . sertraline (ZOLOFT) 25 MG tablet TAKE 1 TABLET (25 MG TOTAL) BY MOUTH DAILY. TAKE AT DINNER TIME 90 tablet 3  . sodium chloride (OCEAN) 0.65 % SOLN nasal spray Place 1 spray into both nostrils as needed for congestion.    . traMADol (ULTRAM) 50 MG tablet Take 1 tablet (50 mg total) by mouth 2 (two) times daily. 180 tablet 0   No current facility-administered medications on file prior to visit.    BP 122/72 mmHg  Pulse 77  Temp(Src) 97.9 F (36.6 C) (Oral)  Ht 5' (1.524 m)  Wt 120 lb 1.9  oz (54.486 kg)  BMI 23.46 kg/m2  SpO2 96%    Objective:   Physical Exam  Constitutional: She is oriented to person, place, and time. She appears well-nourished.  HENT:  Mouth/Throat: Uvula is midline and mucous membranes are normal. No oropharyngeal  exudate, posterior oropharyngeal edema or posterior oropharyngeal erythema.  Cardiovascular: Normal rate and regular rhythm.   Pulmonary/Chest: Effort normal and breath sounds normal.  Abdominal: Soft. Bowel sounds are normal. There is no tenderness.  Neurological: She is alert and oriented to person, place, and time.  Skin: Skin is warm and dry.  Psychiatric: She has a normal mood and affect.  Cooperative          Assessment & Plan:  Agitation:  No complaints per patient. Denies difficulty swallowing UA: Positive for nitrites and blood, negative for leuks.  Will send culture. Will treat with antibiotics given symptoms and questionable UA. She is to follow up if development of fevers, abdominal pain.

## 2015-07-26 NOTE — Progress Notes (Signed)
Pre visit review using our clinic review tool, if applicable. No additional management support is needed unless otherwise documented below in the visit note. 

## 2015-07-26 NOTE — Patient Instructions (Signed)
Start Bactrim DS antibiotics. Take 1 tablet by mouth twice daily for 6 days.  Increase intake of water to stay hydrated.  We will send off your urine for culture for further testing.  Please notify me if you develop fevers, abdominal pain.  It was a pleasure meeting you!  Urinary Tract Infection Urinary tract infections (UTIs) can develop anywhere along your urinary tract. Your urinary tract is your body's drainage system for removing wastes and extra water. Your urinary tract includes two kidneys, two ureters, a bladder, and a urethra. Your kidneys are a pair of bean-shaped organs. Each kidney is about the size of your fist. They are located below your ribs, one on each side of your spine. CAUSES Infections are caused by microbes, which are microscopic organisms, including fungi, viruses, and bacteria. These organisms are so small that they can only be seen through a microscope. Bacteria are the microbes that most commonly cause UTIs. SYMPTOMS  Symptoms of UTIs may vary by age and gender of the patient and by the location of the infection. Symptoms in young women typically include a frequent and intense urge to urinate and a painful, burning feeling in the bladder or urethra during urination. Older women and men are more likely to be tired, shaky, and weak and have muscle aches and abdominal pain. A fever may mean the infection is in your kidneys. Other symptoms of a kidney infection include pain in your back or sides below the ribs, nausea, and vomiting. DIAGNOSIS To diagnose a UTI, your caregiver will ask you about your symptoms. Your caregiver also will ask to provide a urine sample. The urine sample will be tested for bacteria and white blood cells. White blood cells are made by your body to help fight infection. TREATMENT  Typically, UTIs can be treated with medication. Because most UTIs are caused by a bacterial infection, they usually can be treated with the use of antibiotics. The choice  of antibiotic and length of treatment depend on your symptoms and the type of bacteria causing your infection. HOME CARE INSTRUCTIONS  If you were prescribed antibiotics, take them exactly as your caregiver instructs you. Finish the medication even if you feel better after you have only taken some of the medication.  Drink enough water and fluids to keep your urine clear or pale yellow.  Avoid caffeine, tea, and carbonated beverages. They tend to irritate your bladder.  Empty your bladder often. Avoid holding urine for long periods of time.  Empty your bladder before and after sexual intercourse.  After a bowel movement, women should cleanse from front to back. Use each tissue only once. SEEK MEDICAL CARE IF:   You have back pain.  You develop a fever.  Your symptoms do not begin to resolve within 3 days. SEEK IMMEDIATE MEDICAL CARE IF:   You have severe back pain or lower abdominal pain.  You develop chills.  You have nausea or vomiting.  You have continued burning or discomfort with urination. MAKE SURE YOU:   Understand these instructions.  Will watch your condition.  Will get help right away if you are not doing well or get worse. Document Released: 09/04/2005 Document Revised: 05/26/2012 Document Reviewed: 01/03/2012 [facility] Patient Information 2015 [place], [place]. This information is not intended to replace advice given to you by your health care provider. Make sure you discuss any questions you have with your health care provider.

## 2015-07-27 ENCOUNTER — Other Ambulatory Visit: Payer: Self-pay | Admitting: Family Medicine

## 2015-07-28 LAB — URINE CULTURE

## 2015-08-04 ENCOUNTER — Telehealth: Payer: Self-pay | Admitting: Family Medicine

## 2015-08-04 ENCOUNTER — Telehealth: Payer: Self-pay

## 2015-08-04 DIAGNOSIS — R300 Dysuria: Secondary | ICD-10-CM | POA: Diagnosis not present

## 2015-08-04 NOTE — Telephone Encounter (Signed)
Hydrate her please  Check ua and culture  if able to do that just in case she has urine infx  Hold her lisinopril until bp comes back to normal  Go to ED if worse

## 2015-08-04 NOTE — Telephone Encounter (Signed)
[name] called wanting to get urine culture results.  She stated ms Payes is not feeling well again today. She wanted to make sure she was on right antibotic Please advise

## 2015-08-04 NOTE — Telephone Encounter (Signed)
See result from Allie Bossier- her uti was sens to her abx  However please check another ua with culture now (said that in prev phone note as well)

## 2015-08-04 NOTE — Telephone Encounter (Signed)
[name] at [facility] called pt has been feeling very weak for 2 days;no respiratory symptoms. Weak and low BP are only symptoms. [name] concerned dehydrated; this afternoon BP 82/56  P 77 reg.  Pulse ox  93 % on room air and 96% on 2 L of O2. No SOB, no cyanosis and no fever.  Leafy Ro is pushing fluids for the next hour because pt does not want to go to ED and otherwise pt is stable. Leafy Ro wants to know if BP goes up to 283 systolic or more should any BP meds be held for next few days and continue to encourage fluids  Or does [name] want pt to go to ED.  Normal BP 662- 947 systolic. [name] request cb ASAP.

## 2015-08-04 NOTE — Telephone Encounter (Signed)
Daughter notified of Dr. Marliss Coots instructions that I gave Leafy Ro [facility]), in another phone note from today, we are getting another UA and culture, holding her BP med and pushing fluids to see if that helps pt

## 2015-08-04 NOTE — Telephone Encounter (Signed)
[name] at [facility] notified of Dr. Marliss Coots instructions and verbalized understanding, the can get a UA and culture and will have results back on Monday

## 2015-08-10 ENCOUNTER — Telehealth: Payer: Self-pay

## 2015-08-10 NOTE — Telephone Encounter (Signed)
[facility] at assisted living [facility] left v/m; lisinopril has been held and for the last week; pts BP has stabilized at 100/50's. Caryl Pina wants to know if should continue holding the lisinopril, change the dosage of lisinopril or restart lisinopril at original dosage. Caryl Pina request cb.

## 2015-08-10 NOTE — Telephone Encounter (Signed)
[facility] Sports notified to continue holding pt's med

## 2015-08-10 NOTE — Telephone Encounter (Signed)
Continue to hold it please - thanks

## 2015-09-01 ENCOUNTER — Other Ambulatory Visit: Payer: Self-pay

## 2015-09-01 MED ORDER — DONEPEZIL HCL 10 MG PO TABS: 10.0000 mg | ORAL_TABLET | Freq: Every day | ORAL | Status: DC

## 2015-09-01 NOTE — Telephone Encounter (Signed)
[facility] with ref # [id number]; [facility] with [facility]ull[facility]; pts [facility] coverage requires routine meds to be sent to mail order. Donepezil 10 mg # 90 x 2 given and [facility] with steve [facility] and cancelled remaining refills of donepezil.

## 2015-09-06 ENCOUNTER — Telehealth: Payer: Self-pay | Admitting: Family Medicine

## 2015-09-06 MED ORDER — LORATADINE 10 MG PO TABS: 10.0000 mg | ORAL_TABLET | Freq: Every day | ORAL | Status: AC | PRN

## 2015-09-06 NOTE — Telephone Encounter (Signed)
[name] @ [facility] called Can you call a rx for [employer]

## 2015-09-06 NOTE — Telephone Encounter (Signed)
Will refill electronically  

## 2015-09-06 NOTE — Telephone Encounter (Signed)
Left voicemail letting Leafy Ro know Rx was sent to pharmacy

## 2015-09-11 ENCOUNTER — Telehealth: Payer: Self-pay

## 2015-09-11 NOTE — Telephone Encounter (Signed)
I can't tell if it was myself or [name] who first ordered it -it is for nasal allergy symptoms (less sedating than other antihistamines) If it helps her and she wants to stay on it -please send to mail order  Thanks

## 2015-09-11 NOTE — Telephone Encounter (Signed)
Pt's daughter notified of Dr. Marliss Coots comments. Pt is going to check with Leafy Ro with [facility] to see why she prescribe med 1st and if need be will call back to get Rx sent to mail order pharmacy

## 2015-09-11 NOTE — Telephone Encounter (Signed)
[name] pts daughter (DPR signed) left v/m wanting to know if [facility] had requested loratadine for pt. [name] did not realize that pt was on loratadine. [name] wants to know if [name] initiated the medication or was it at [facility] request. [name] said in the future if pt needs med to send to [facility] mail order pharmacy. [name] request cb.

## 2015-10-04 ENCOUNTER — Telehealth: Payer: Self-pay | Admitting: Internal Medicine

## 2015-10-04 NOTE — Telephone Encounter (Signed)
Spoke with pt's daughter. States that pt has an appointment on 10/12/15. Wanted to give [place] an update on her since she has dementia. Pt has been doing well and has been having breathing issues. Uses oxygen QHS and with all of her naps. Taking prednisone as prescribed by CY. Advised the pt's daughter to call us and let us know if things change between now and her appointment. She agreed. Nothing further was needed at this time.  Will route message to CY to make him aware.

## 2015-10-09 DIAGNOSIS — Z23 Encounter for immunization: Secondary | ICD-10-CM | POA: Diagnosis not present

## 2015-10-12 ENCOUNTER — Ambulatory Visit: Payer: Medicare Other | Admitting: Internal Medicine

## 2015-10-16 ENCOUNTER — Ambulatory Visit (INDEPENDENT_AMBULATORY_CARE_PROVIDER_SITE_OTHER): Payer: Medicare Other | Admitting: Internal Medicine

## 2015-10-16 ENCOUNTER — Encounter: Payer: Self-pay | Admitting: Internal Medicine

## 2015-10-16 VITALS — BP 120/72 | HR 77 | Ht 60.0 in | Wt 121.4 lb

## 2015-10-16 DIAGNOSIS — I5022 Chronic systolic (congestive) heart failure: Secondary | ICD-10-CM

## 2015-10-16 DIAGNOSIS — G4734 Idiopathic sleep related nonobstructive alveolar hypoventilation: Secondary | ICD-10-CM

## 2015-10-16 DIAGNOSIS — J449 Chronic obstructive pulmonary disease, unspecified: Secondary | ICD-10-CM | POA: Diagnosis not present

## 2015-10-16 NOTE — Patient Instructions (Signed)
I am glad you are doing so well  Please call if you need [place]

## 2015-10-16 NOTE — Progress Notes (Signed)
06/08/12- 81 yoF former smoker  referred by Dr Benay Spice. Pt states having Increase SOB upon activity Productive cough in am . PCP Dr Loura Pardon    Son here She is being monitored for possible pancreatic tumor and has history of a biliary stent. For about 3 months she has been aware of dyspnea on exertion while walking, relieved by sitting. She had a "mild outpatient pneumonia" treated 2 weeks ago. She had been exercising at the [facility]. She was noting more shortness of breath with exertion after bad cold and some shingles. Weight had been going down. She has been drinking diet supplements to gain weight. She denies cough, wheeze, chest pain. She does hear a rattle but coughs out nothing. Legs feel weak. She denies fever, blood or swollen glands. She denies past history of asthma or pneumonia and she denies heart disease, reflux, choking with meals. She is widowed, living in a retirement center. 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 Father died with pneumonia, mother died with MI. CT chest was concerning for density, more than a "mild pneumonia". CT chest 05/07/12- reviewed with her IMPRESSION:  No evidence of significant pulmonary embolus. The extensive  consolidation of the right lower lung and right middle lung with  patchy areas of nodular infiltration in the left lower lung.  Changes may represent pneumonia. No definite evidence of a central  obstructing lesion although the distal right main stem bronchus is  somewhat narrowed and a cold mass may be obscured by the  consolidation. Recommend follow up after resolution of acute  symptoms. Large esophageal hiatal hernia with mild esophageal  dilatation. Esophagitis versus dysmotility. Biliary wall stent  and biliary gas demonstrated in the upper abdomen.  Original Report Authenticated By: Neale Burly, M.D.   CXR 06/03/12- IMPRESSION:  1. Multi focal  right lung airspace disease has not significantly  changed since 05/07/2012.  2. Interval increased patchy opacity at the left lung base.  Original Report Authenticated By: Randall An, M.D.   07/07/12- 81 yoF former smoker followed for pneumonia complicated by large hiatal hernia, pancreatic cancer. Dr Sherrill/ Onc> Hospice. Daughter and son here today. She is now followed by hospice because of pancreatic cancer with biliary stent. Notices dyspnea standing, and cough as she gets out of bed and starts moving. Mostly clear phlegm, occasional scant green. Wakes herself coughing. Avelox did not help. We reviewed the CT scan images again, nothing large hiatal hernia full of food and parenchymal consolidation consistent with pneumonia and especially consistent with aspiration pneumonia. Discussed possibility of organizing pneumonia.  07/27/12-  81 yoF former smoker followed for pneumonia complicated by large hiatal hernia, pancreatic cancer.  Dtr here. Sputum CX 06/10/12- Nl flora, neg for AFB. Dr Ardis Hughs has indicated steroid trial, aimed at organizing pneumonia, is ok from pancreas standpoint.  Very short of breath after shower. Home oxygen helps, provided by Hospice through? [employer]. No pain, fever, sweat. Scant clear mucus. Flutter device helps shortness of breath after use, but does not generate productive cough. She denies any choking or strangling with meals or during sleep. CXR I 07/27/12- images reviewed with them. IMPRESSION:  1. Worsening asymmetric airspace disease involving right lung  greater than left, suspicious for pneumonia.  2. Stable mild cardiomegaly.  3. Hiatal hernia again noted.  Original Report Authenticated By: Marlaine Hind, M.D.   08/13/12- 81 yoF former smoker followed for  pneumonia complicated by large hiatal hernia, pancreatic cancer.  Dtr here.  Pt reports legs no longer hurt, no more coughing in the morning, wearing O2 most of the time--not today. Has continued  maintenance prednisone 20 mg daily and recognizes breathing is better but it gives her some insomnia. Has elevated head of bed. Using oxygen at 2 L only as needed now. Not coughing at all. Wants flu vax CXR 08/13/12- IMPRESSION:  Improved right lower lobe and left lung base infiltrates.  Underlying COPD. No new abnormalities.   10/02/12- 81 yoF former smoker followed for pneumonia complicated by large hiatal hernia, pancreatic cancer.  Dtr here. Follow-Up  - has been feeling very anxious lately. Hospice nurse thinks that she needs something for anxiety. Prednisone seemed to reduce the amount of parenchymal density, consistent with impression that it was organizing pneumonia. She is followed with a hospice nurse. Reducing prednisone to 5 mg daily left her too short of breath, so  she went back to 10 mg daily. Less morning cough. Recent night sweats. Low back pain sometimes radiates to abdomen. CXR 10/02/12- we reviewed the images together IMPRESSION:  Improved airspace disease in the right mid and lower lung zones.  Persistent opacity is noted  Stable spiculated opacity at the left lung base. Underlying  malignancy cannot be excluded.  Original Report Authenticated By: Jamas Lav, M.D.   03/22/13- 81 yoF former smoker followed for pneumonia complicated by large hiatal hernia.  Dtr here. FOLLOWS FOR: breathing doing good as long as wearing O2; daughter states that pt is still breathy; hospice is pulling out as pt does not have pancreatic cancer; will need order for O2 through DME company since hospice is no longer going to help with this.  Has been using prednisone 10 mg alternating with 5 mg every other day. Makes her anxious. Mentions pains and right rib cage that seem to come and go.  05/17/13- 81 yoF former smoker followed for Hx pneumonia, COPD/E,  complicated by large hiatal hernia.  Dtr here. FOLLOWS FOR: continues to use O2 QHS. Breathing is doing okay O2 2-4L/ [facility], just for  sleep and as needed, but she doesn't think she needs much. Moving to assisted living. Per the right side of her back exercising, helped by physical therapy Prednisone now reduced to 5 mg daily. CXR 03/23/13-Stable spiculated opacity at the left lung base. Large hiatal hernia. IMPRESSION:  No acute abnormality is noted.  Original Report Authenticated By: Inez Catalina, M.D  08/19/13- 33 yoF former smoker followed for Hx pneumonia, COPD/E, lung nodule,  complicated by large hiatal hernia.  Dtr here FOLLOWS FOR: unsure if needs to go back to O2; uses her flutter device and feels that  helps some; had called about SOB and had prednisone increased. Cut off oxygen  Then tapered prednisone. Started getting more short of breath so she increased the prednisone back to 10 mg daily for the past month. Occasional dyspnea on exertion in assisted living but now feels comfortable. //needs f/u CXR for lung nodule//  11/25/13 Follow up COPD  Has to re- qualify for O2 -- did not qualify today here in the office.  No desats with walking.  Says overall breathing is the same . No flare in cough, wheezing or dyspnea. Gets winded easily.  On prednisone 5mg  alterating with 10mg  daily   01/06/14- 88 yoF former smoker followed for Hx pneumonia, COPD/E, lung nodule,  complicated by large hiatal hernia.  Dtr here FOLLOWS FOR:  Still  having sob with exertion-- discuss ONO results Diagnosed CHF in October. Hospitalized in October for easy dyspnea on exertion. Better now. Little cough. [place] 01/01/14- qualified for sleep oxygen  05/05/14- 88 yoF former smoker followed for Hx pneumonia, COPD/E, lung nodule,  complicated by large hiatal hernia.  Dtr here FOLLOWS FOR: Pt having SOB still-gets worse as the day goes on; using O2 QHS only. O2 2L sleep/ APS O2 helps sleep. Cardiology notes her large hiatal hernia after meals crowds her breathing. By end of day she is tired, chest feels full after supper-more short of breath then. No  chest pain or cough noted.   12/19/14- 84 yoF former smoker followed for Hx pneumonia, COPD/E, lung nodule,  complicated by large hiatal hernia.  Dtr here O2 2L sleep/ APS FOLLOWS FOR: Walking inside due to cold weather-able to maintain(uses rolling walker to assist in this). Pt states she has SOB.  Lives at assisted living. No acute complaints. Needs prednisone refilled-discussed CXR report 09/26/14 Hiatal hernia, Cardiac enlargement, bilateral lower zone atelectasis.   10/16/15- 82 year old female former smoker followed for history of pneumonia, COPD/emphysema, lung nodule, complicated by large hiatal hernia O2 2L sleep/ APS            here with an aide FOLLOWS FOR: Pt states she finds herself SOB if walking long distance; uses O2 as well. CNA with patient states patient has hard time with breathing if bent over for too long. Admits occasional dyspnea on exertion walking, but no acute change and she walks daily. Has had flu vaccine  ROS-see HPI Constitutional:   No-  weight loss, night sweats, fevers, chills, fatigue, lassitude. HEENT:   No-  headaches, difficulty swallowing, tooth/dental problems, sore throat,       No-  sneezing, itching, ear ache, nasal congestion, post nasal drip,  CV:  No-   chest pain, orthopnea, PND, swelling in lower extremities, anasarca,dizziness, palpitations Resp: +shortness of breath with exertion or at rest.              No- productive cough,  No non-productive cough,  No- coughing up of blood.              No-   change in color of mucus.  No- wheezing.   Skin: No-   rash or lesions. GI:  No-   heartburn, indigestion, abdominal pain, nausea, vomiting,  GU:  MS:  No-   joint pain or swelling.  +back pain Neuro-     nothing unusual Psych:  No- change in mood or affect.  depression or +anxiety.  No memory loss.  OBJ- Physical Exam General- Alert, Oriented, Affect-appropriate, Distress- none acute, thin elderly woman Skin- rash-none, lesions- none,  excoriation- none Lymphadenopathy- none Head- atraumatic            Eyes- Gross vision intact, PERRLA, conjunctivae and secretions clear            Ears- Hearing, canals-normal            Nose- Clear, no-Septal dev, mucus, polyps, erosion, perforation             Throat- Mallampati II , mucosa clear , drainage- none, tonsils- atrophic. Own teeth Neck- flexible , trachea midline, no stridor , thyroid nl, carotid no bruit Chest - symmetrical excursion , unlabored           Heart/CV- RRR/few extra beats , no murmur , no gallop  , no rub, nl + split second sound                           -  JVD- none , edema- none, stasis changes- none, varices- none           Lung- +diminished/clear, unlabored, rub- none, no cough, wheeze+trace           Chest wall-  Abd-  Br/ Gen/ Rectal- Not done, not indicated Extrem- cyanosis- none, clubbing, none, atrophy- none, strength- nl, + using a walker Neuro- grossly intact to observation

## 2015-10-22 NOTE — Assessment & Plan Note (Signed)
Continues to sleep with O2 2 L/APS but with no apparent worsening

## 2015-10-22 NOTE — Assessment & Plan Note (Signed)
No acute exacerbation. Mild, stable dyspnea with exertion but she is able to continue walking regularly

## 2015-10-22 NOTE — Assessment & Plan Note (Signed)
Fairly well controlled without right heart failure signs

## 2015-10-25 ENCOUNTER — Ambulatory Visit: Payer: Medicare Other | Admitting: Internal Medicine

## 2015-10-27 ENCOUNTER — Telehealth: Payer: Self-pay

## 2015-10-27 NOTE — Telephone Encounter (Signed)
Bridget Walker spoke with pharmacy and was advised when pharmacy requested refill Vit D 2; 50,000 unit in Sept was denied and said pt no longer taking.(do not see note about pharmacy calling). Bridget Walker said if pt is not to take Vit D 2; 50,000 units needs d/c order faxed to fax # [phone]. If pt is to continue Vit D 50,000 [facility]. Bridget Walker request cb.

## 2015-10-30 ENCOUNTER — Encounter: Payer: Self-pay | Admitting: Family Medicine

## 2015-10-30 ENCOUNTER — Ambulatory Visit (INDEPENDENT_AMBULATORY_CARE_PROVIDER_SITE_OTHER): Payer: Medicare Other | Admitting: Family Medicine

## 2015-10-30 ENCOUNTER — Telehealth: Payer: Self-pay | Admitting: Family Medicine

## 2015-10-30 VITALS — BP 136/82 | HR 70 | Temp 98.4°F | Ht 60.0 in | Wt 124.5 lb

## 2015-10-30 DIAGNOSIS — E2839 Other primary ovarian failure: Secondary | ICD-10-CM | POA: Insufficient documentation

## 2015-10-30 DIAGNOSIS — I5022 Chronic systolic (congestive) heart failure: Secondary | ICD-10-CM | POA: Diagnosis not present

## 2015-10-30 DIAGNOSIS — E119 Type 2 diabetes mellitus without complications: Secondary | ICD-10-CM

## 2015-10-30 DIAGNOSIS — M810 Age-related osteoporosis without current pathological fracture: Secondary | ICD-10-CM

## 2015-10-30 DIAGNOSIS — F0390 Unspecified dementia without behavioral disturbance: Secondary | ICD-10-CM

## 2015-10-30 DIAGNOSIS — J449 Chronic obstructive pulmonary disease, unspecified: Secondary | ICD-10-CM

## 2015-10-30 DIAGNOSIS — F068 Other specified mental disorders due to known physiological condition: Secondary | ICD-10-CM

## 2015-10-30 DIAGNOSIS — I493 Ventricular premature depolarization: Secondary | ICD-10-CM | POA: Diagnosis not present

## 2015-10-30 NOTE — Progress Notes (Signed)
Pre visit review using our clinic review tool, if applicable. No additional management support is needed unless otherwise documented below in the visit note. 

## 2015-10-30 NOTE — Assessment & Plan Note (Signed)
dexa ordered

## 2015-10-30 NOTE — Assessment & Plan Note (Signed)
Overall stable and tolerating aricept 10 mg without problems Will continue to follow

## 2015-10-30 NOTE — Progress Notes (Signed)
Subjective:    Patient ID: Bridget Walker, female    DOB: [birth year], 81 y.o.   MRN: [id number]  HPI Here for f/u of chronic health problems   Was supposed to get her labs at [facility] - did not get a draw   Wt is up 3 lb with bmi of 24  Pt states she is "up and going" -no complains   Went off creon for a while and now back on it  ? If still needs  Has gained 3 lb  Does not think it is causing side effects   Taking vit D daily and was on 50,000 iu monthly- just d/c the monthy Was on fosamax for a while and stopped it - thinks was stomach upset   Last dexa OP was 2013 No falls or fractures  On chronic prednisone   CHF - stable/ no right failure symptoms   COPD is also stable - still uses 02 at night  Sees pulmonary Cannot get off the prednisone   Lipids Lab Results  Component Value Date   CHOL 186 06/15/2014   HDL 46.50 06/15/2014   LDLCALC 123* 06/15/2014   LDLDIRECT 137.0 11/20/2011   TRIG 83.0 06/15/2014   CHOLHDL 4 06/15/2014   Diet is monitored   Due for A1C Lab Results  Component Value Date   HGBA1C 7.0* 01/18/2015    Due for that   Takes aricept for dementia  No side effects - doing well  On 10 mg now    Patient Active Problem List   Diagnosis Date Noted  . Estrogen deficiency 10/30/2015  . Mental status change 02/21/2015  . Contusion of arm, left 02/21/2015  . Elevated transaminase level 10/24/2014  . Diabetes type 2, controlled ([place]) 09/19/2014  . History of fall 08/18/2014  . Encounter for Medicare annual wellness exam 06/15/2014  . Dementia arising in the senium and presenium 06/15/2014  . Anxiety disorder 06/15/2014  . Orthostatic hypotension 03/10/2014  . Nocturnal hypoxemia 01/18/2014  . Chronic systolic CHF (congestive heart failure) ([place]) 10/20/2013  . Compression fracture of spine ([place]) 06/10/2013  . Back pain of thoracolumbar region 06/09/2013  . Mobility impaired 04/16/2013  . Frequent PVCs 03/28/2013  . COPD mixed type ([place])  03/28/2013  . Lung nodule, L lower lobe 10/15/2012  . Lobar pneumonia due to unspecified organism 06/11/2012  . Nonspecific (abnormal) findings on radiological and other examination of gastrointestinal tract 03/24/2012  . Nonspecific (abnormal) findings on radiological and other examination of biliary tract 03/24/2012  . Fatigue 03/18/2012  . Jaundice 03/18/2012  . Loss of appetite 03/18/2012  . Anemia 11/29/2011  . TINNITUS 08/09/2010  . OVERACTIVE BLADDER 03/14/2010  . GERD 11/06/2009  . Vitamin D deficiency 10/31/2008  . DIVERTICULOSIS OF COLON 12/25/2007  . RESTLESS LEG SYNDROME 10/26/2007  . COLONIC POLYPS, HX OF 10/26/2007  . HYPERCHOLESTEROLEMIA 09/23/2007  . OSTEOARTHRITIS 09/23/2007  . Osteoporosis 09/23/2007   Past Medical History  Diagnosis Date  . Osteoporosis   . Hyperlipidemia   . RLS (restless legs syndrome)   . Vitamin D deficiency   . GERD (gastroesophageal reflux disease)   . Cerumen impaction     recurrent  . Unexplained weight loss   . Cough   . Constipation   . Diarrhea   . Shingles   . Overactive bladder   . Cancer [facility])     pancreatic  . Arthritis     OA, in hands  . CHF (congestive heart failure) ([place])   .  Pancreatic insufficiency ([place])   . Hiatal hernia    Past Surgical History  Procedure Laterality Date  . Cataract extraction      patient unsure of date procedure was done.  . Cataract extraction  11/08    2nd cataract  . Ercp  03/24/2012    Procedure: ENDOSCOPIC RETROGRADE CHOLANGIOPANCREATOGRAPHY (ERCP);  Surgeon: Milus Banister, MD;  Location: [facility];  Service: Endoscopy;  Laterality: N/A;  . Eus  04/02/2012    Procedure: ESOPHAGEAL ENDOSCOPIC ULTRASOUND (EUS) RADIAL;  Surgeon: Milus Banister, MD;  Location: [facility] ENDOSCOPY;  Service: Endoscopy;  Laterality: N/A;  EUS with FNA  . Fine needle aspiration  04/02/2012    Procedure: FINE NEEDLE ASPIRATION (FNA) LINEAR;  Surgeon: Milus Banister, MD;  Location: [facility] ENDOSCOPY;  Service:  Endoscopy;  Laterality: N/A;  . Endoscopic retrograde cholangiopancreatography (ercp) with propofol N/A 01/28/2013    Procedure: ENDOSCOPIC RETROGRADE CHOLANGIOPANCREATOGRAPHY (ERCP) WITH PROPOFOL;  Surgeon: Milus Banister, MD;  Location: [facility] ENDOSCOPY;  Service: Endoscopy;  Laterality: N/A;  . Cardiac catheterization  10/14    [facility];no stent   Social History  Substance Use Topics  . Smoking status: Former Smoker -- 0.25 packs/day for 20 years    Types: Cigarettes    Quit date: 03/23/1992  . Smokeless tobacco: Never Used     Comment: PT SMOKED 2 CIGARETTES A DAY   . Alcohol Use: No   Family History  Problem Relation Age of Onset  . Diabetes Sister   . Hypertension Sister   . Heart disease Brother   . Fibromyalgia Daughter   . Alcohol abuse Son   . Heart disease Brother   . Diabetes Brother   . Heart disease Mother   . Pneumonia Father   . Heart failure Father   . Cancer Other     niece had breast and bone cancer   Allergies  Allergen Reactions  . Lactose Intolerance ([place])     Pt can drink skim milk  . Hydrocodone Rash    Patient unsure of reaction, but it may have been diarrhea.  . Penicillins Rash    On scalp   Current Outpatient Prescriptions on File Prior to Visit  Medication Sig Dispense Refill  . ALPRAZolam (XANAX) 0.5 MG tablet Take 0.5 mg by mouth 3 (three) times daily as needed for anxiety.    Marland Kitchen aspirin EC 81 MG tablet Take 81 mg by mouth daily.    . Calcium Carbonate-Vitamin D (CALTRATE 600+D PO) Take 2 tablets by mouth daily.    . carvedilol (COREG) 3.125 MG tablet TAKE 1 TABLET TWICE A DAY WITH A MEAL 180 tablet 3  . CREON 3000-9500 UNITS CPEP TAKE 1 CAPSULE THREE TIMES A DAY 270 capsule 3  . donepezil (ARICEPT) 10 MG tablet Take 1 tablet (10 mg total) by mouth at bedtime. 90 tablet 2  . ergocalciferol (VITAMIN D2) 50000 UNITS capsule Take 50,000 Units by mouth once a week.    . furosemide (LASIX) 20 MG tablet TAKE 1 TABLET DAILY (Patient taking differently:  TAKE 1 TABLET EVERY OTHER DAY.) 90 tablet 3  . KLOR-CON M10 10 MEQ tablet TAKE 1 TABLET DAILY 90 tablet 3  . lisinopril (PRINIVIL,ZESTRIL) 2.5 MG tablet TAKE 1 TABLET DAILY 90 tablet 3  . loratadine (CLARITIN) 10 MG tablet Take 1 tablet (10 mg total) by mouth daily as needed for allergies. 90 tablet 3  . NEXIUM 40 MG capsule TAKE 1 CAPSULE DAILY BEFORE BREAKFAST 90 capsule 1  .  NON FORMULARY Oxygen 2 liters at bedtimes.    Vladimir Faster Glycol-Propyl Glycol (SYSTANE) 0.4-0.3 % GEL Apply to eye. 1-2 drops in each eye BID prn    . polyethylene glycol powder (GLYCOLAX/MIRALAX) powder Take 17 g by mouth daily as needed. 527 g 3  . predniSONE (DELTASONE) 5 MG tablet Alternate 10 mg with 5 mg every other day 50 tablet 3  . sertraline (ZOLOFT) 25 MG tablet TAKE 1 TABLET (25 MG TOTAL) BY MOUTH DAILY. TAKE AT DINNER TIME 90 tablet 3  . sodium chloride (OCEAN) 0.65 % SOLN nasal spray Place 1 spray into both nostrils as needed for congestion.    . traMADol (ULTRAM) 50 MG tablet Take 1 tablet (50 mg total) by mouth 2 (two) times daily. 180 tablet 0   No current facility-administered medications on file prior to visit.    Review of Systems Review of Systems  Constitutional: Negative for fever, appetite change, fatigue and unexpected weight change.  Eyes: Negative for pain and visual disturbance.  Respiratory: Negative for cough and shortness of breath.   Cardiovascular: Negative for cp or palpitations    Gastrointestinal: Negative for nausea, diarrhea and constipation.  Genitourinary: Negative for urgency and frequency.  Skin: Negative for pallor or rash   MSK pos for arthritis pain -unchanged  Neurological: Negative for weakness, light-headedness, numbness and headaches.  Hematological: Negative for adenopathy. Does not bruise/bleed easily.  Psychiatric/Behavioral: Negative for dysphoric mood. Pos for occ anxiety pos for stable dementia        Objective:   Physical Exam  Constitutional: She  appears well-developed and well-nourished. No distress.  Frail appearing elderly female with walker     HENT:  Head: Normocephalic and atraumatic.  Mouth/Throat: Oropharynx is clear and moist.  Eyes: Conjunctivae and EOM are normal. Pupils are equal, round, and reactive to light.  Neck: Normal range of motion. Neck supple. No JVD present. Carotid bruit is not present. No thyromegaly present.  Cardiovascular: Normal rate, regular rhythm, normal heart sounds and intact distal pulses.  Exam reveals no gallop.   Pulmonary/Chest: Effort normal and breath sounds normal. No respiratory distress. She has no wheezes. She has no rales.  No crackles  Abdominal: Soft. Bowel sounds are normal. She exhibits no distension, no abdominal bruit and no mass. There is no tenderness.  Musculoskeletal: She exhibits no edema.  Lymphadenopathy:    She has no cervical adenopathy.  Neurological: She is alert. She has normal reflexes.  Skin: Skin is warm and dry. No rash noted.  Psychiatric: She has a normal mood and affect. Her speech is normal and behavior is normal. Her mood appears not anxious. She does not exhibit a depressed mood. She exhibits abnormal recent memory.  Cheerful and talkative overall  Does ramble occasionally  Not anxious or agitated Dementia seems stable           Assessment & Plan:   Problem List Items Addressed This Visit      Cardiovascular and Mediastinum   Chronic systolic CHF (congestive heart failure) (HCC)    No complaints - overall stable  On coreg, lasix and lisinopril  Wt is up 3 lb - pt reports actual wt gain from eating better       Frequent PVCs - Primary     Respiratory   COPD mixed type ([place])    Seeing pulmonary  No new problems occ sob on exertion if she overdoes it  Continues night time 02 Still dependent on prednisone  Endocrine   Diabetes type 2, controlled ([place])    Due for A1C today  Eating what she wants to - states that she tries to  limit sweets  Wt gain several lb-good appetite No hypoglycemia Unable to get off prednisone Lab today      Relevant Orders   Comprehensive metabolic panel   Hemoglobin A1c   Lipid panel     Nervous and Auditory   Dementia arising in the senium and presenium    Overall stable and tolerating aricept 10 mg without problems Will continue to follow         Musculoskeletal and Integument   Osteoporosis    Plan dexa  Was briefly on fosamax- ? Stopped due to GI side effects (pt and daughter unsure) No falls or fx Steroid dependent  Disc need for calcium/ vitamin D/ wt bearing exercise and bone density test every 2 y to monitor Disc safety/ fracture risk in detail          Other   Estrogen deficiency    dexa ordered       Relevant Orders   DG Bone Density

## 2015-10-30 NOTE — Telephone Encounter (Signed)
Pt needs lab orders for mcr wellness visit scheduled for 05/01/16 to be sent to [facility]: [facility]. Best number to contact [name] (daughter) [phone].

## 2015-10-30 NOTE — Assessment & Plan Note (Signed)
No complaints - overall stable  On coreg, lasix and lisinopril  Wt is up 3 lb - pt reports actual wt gain from eating better

## 2015-10-30 NOTE — Patient Instructions (Signed)
Please have the staff deactivate your mychart account at check out  Stop at check out for referral for bone density test  Stay on creon  We are stopping the monthly vitamin D but continue the daily   Lab today

## 2015-10-30 NOTE — Telephone Encounter (Signed)
Order to d/c med faxed

## 2015-10-30 NOTE — Assessment & Plan Note (Signed)
Plan dexa  Was briefly on fosamax- ? Stopped due to GI side effects (pt and daughter unsure) No falls or fx Steroid dependent  Disc need for calcium/ vitamin D/ wt bearing exercise and bone density test every 2 y to monitor Disc safety/ fracture risk in detail

## 2015-10-30 NOTE — Assessment & Plan Note (Addendum)
Seeing pulmonary  No new problems occ sob on exertion if she overdoes it  Continues night time 02 Still dependent on prednisone

## 2015-10-30 NOTE — Assessment & Plan Note (Signed)
Due for A1C today  Eating what she wants to - states that she tries to limit sweets  Wt gain several lb-good appetite No hypoglycemia Unable to get off prednisone Lab today

## 2015-10-30 NOTE — Telephone Encounter (Signed)
I wrote order to stop it and put that in the IN box

## 2015-10-31 LAB — LIPID PANEL
CHOLESTEROL: 201 mg/dL — AB (ref 0–200)
HDL: 67.9 mg/dL (ref >39.00)
LDL Cholesterol: 113 mg/dL — ABNORMAL HIGH (ref 0–99)
NonHDL: 133.19
TRIGLYCERIDES: 103 mg/dL (ref 0.0–149.0)
Total CHOL/HDL Ratio: 3
VLDL: 20.6 mg/dL (ref 0.0–40.0)

## 2015-10-31 LAB — COMPREHENSIVE METABOLIC PANEL
ALT: 13 U/L (ref 0–35)
AST: 37 U/L (ref 0–37)
Albumin: 4 g/dL (ref 3.5–5.2)
Alkaline Phosphatase: 50 U/L (ref 39–117)
BUN: 21 mg/dL (ref 6–23)
CALCIUM: 9.4 mg/dL (ref 8.4–10.5)
CO2: 31 mEq/L (ref 19–32)
Chloride: 102 mEq/L (ref 96–112)
Creatinine, Ser: 0.89 mg/dL (ref 0.40–1.20)
GFR: 63.58 mL/min (ref >60.00)
Glucose, Bld: 121 mg/dL — ABNORMAL HIGH (ref 70–99)
Potassium: 4.6 mEq/L (ref 3.5–5.1)
Sodium: 139 mEq/L (ref 135–145)
Total Bilirubin: 0.3 mg/dL (ref 0.2–1.2)
Total Protein: 7.2 g/dL (ref 6.0–8.3)

## 2015-10-31 LAB — HEMOGLOBIN A1C: Hgb A1c MFr Bld: 6.6 % — ABNORMAL HIGH (ref 4.6–6.5)

## 2015-11-01 NOTE — Telephone Encounter (Signed)
Order in IN box 

## 2015-11-01 NOTE — Telephone Encounter (Signed)
Order faxed to [facility] and daughter notified

## 2015-11-04 ENCOUNTER — Other Ambulatory Visit: Payer: Self-pay | Admitting: Family Medicine

## 2015-11-09 ENCOUNTER — Other Ambulatory Visit: Payer: Self-pay | Admitting: Internal Medicine

## 2015-11-16 ENCOUNTER — Other Ambulatory Visit: Payer: Self-pay

## 2015-11-16 NOTE — Telephone Encounter (Signed)
Katie independent living nurse at [facility] left v/m requesting refill tramadol to [facility].Katie request cb. Last refilled # 180 on 07/07/15. Last seen 10/30/15.

## 2015-11-16 NOTE — Telephone Encounter (Signed)
Px written for call in   

## 2015-11-17 MED ORDER — TRAMADOL HCL 50 MG PO TABS: 50.0000 mg | ORAL_TABLET | Freq: Two times a day (BID) | ORAL | Status: DC

## 2015-11-17 NOTE — Telephone Encounter (Signed)
Rx faxed to [facility].

## 2015-11-17 NOTE — Telephone Encounter (Signed)
Printed to fax - for mail order

## 2015-11-27 ENCOUNTER — Ambulatory Visit: Payer: Self-pay | Admitting: Cardiovascular Disease

## 2015-12-07 ENCOUNTER — Encounter: Payer: Self-pay | Admitting: *Deleted

## 2015-12-13 ENCOUNTER — Ambulatory Visit
Admission: RE | Admit: 2015-12-13 | Discharge: 2015-12-13 | Disposition: A | Discharge status: Home / Self Care | Payer: Medicare Other | Source: Ambulatory Visit | Attending: Family Medicine | Admitting: Family Medicine

## 2015-12-13 DIAGNOSIS — E2839 Other primary ovarian failure: Secondary | ICD-10-CM

## 2015-12-13 DIAGNOSIS — M810 Age-related osteoporosis without current pathological fracture: Secondary | ICD-10-CM | POA: Diagnosis not present

## 2015-12-18 ENCOUNTER — Telehealth: Payer: Self-pay | Admitting: Family Medicine

## 2015-12-18 MED ORDER — ALENDRONATE SODIUM 70 MG PO TABS: 70.0000 mg | ORAL_TABLET | ORAL | Status: DC

## 2015-12-18 NOTE — Telephone Encounter (Signed)
-----   Message from Tammi Sou, [place] sent at 12/18/2015  4:14 PM EST ----- Daughter notified of DEXA results and Dr. Marliss Coots recommendations. Daughter said pt didn't have any side eff to the fosamax so they are okay with pt taking Rx again, daughter request that we send Rx to [facility] and they call assisted living to see if we need an order sent to them so they will start giving pt med, please send in Rx to mail order pharmacy   (I will call assisted living tomorrow, once Rx has been sent)

## 2015-12-18 NOTE — Telephone Encounter (Signed)
Sending px to [facility]  Update me if any side effects or problems

## 2015-12-19 NOTE — Telephone Encounter (Signed)
[name] notified and advised of Dr. Marliss Coots comments

## 2016-01-08 ENCOUNTER — Ambulatory Visit (INDEPENDENT_AMBULATORY_CARE_PROVIDER_SITE_OTHER): Payer: Medicare Other | Admitting: Cardiovascular Disease

## 2016-01-08 ENCOUNTER — Encounter: Payer: Self-pay | Admitting: Cardiovascular Disease

## 2016-01-08 VITALS — BP 122/70 | HR 79 | Ht 60.0 in | Wt 122.0 lb

## 2016-01-08 DIAGNOSIS — I493 Ventricular premature depolarization: Secondary | ICD-10-CM

## 2016-01-08 DIAGNOSIS — I5022 Chronic systolic (congestive) heart failure: Secondary | ICD-10-CM

## 2016-01-08 DIAGNOSIS — I951 Orthostatic hypotension: Secondary | ICD-10-CM | POA: Diagnosis not present

## 2016-01-08 DIAGNOSIS — E7800 Pure hypercholesterolemia, unspecified: Secondary | ICD-10-CM

## 2016-01-08 NOTE — Assessment & Plan Note (Signed)
In terms of her fluid status, she appears relatively euvolemic. She does not drink very much fluids, has not been taking Lasix, weight has been trending downwards. Clinically does not have edema, no abdominal swelling, lungs are clear. No changes to her medications as she feels well

## 2016-01-08 NOTE — Progress Notes (Signed)
Patient ID: Bridget Walker, female    DOB: [birth year], 82 y.o.   MRN: [id number]  HPI Comments: Ms. Saxer is a 82 year old woman with history of large hiatal hernia, underlying pulmonary disease/COPD on chronic prednisone, chronic pancreatitis who lives at [facility],  admission to the hospital 10/01/2013 with discharge 10/04/2013 with shortness of breath. Diagnosed with cardiomyopathy Cardiac catheterization 10/04/2013 showed no significant CAD  Echocardiogram showed ejection fraction 30%. She was given diuretics with subsequent acute renal failure.  creatinine increased from 1.15 up to 2.58. BUN increased from 28 to 47 . Diuretics were held, fluids initiated with improvement of her renal function back to her baseline . readmitted to the hospital 10/06/2013 for hypotension felt secondary to medications. Systolic pressure was 80. She was given fluids, Lasix was held temporarily. Hold parameters placed on her medications . She presents for routine follow-up of her chronic systolic CHF She currently lives at [facility] care, presents with her daughter today  Previous orders were to give Lasix for weight over 130 pounds. Her weight has been dropping over the past year or so, has not been over 130 pounds in quite some time Most recent weight down and additional one or 2 pounds from her previous clinic visit, now 122 pounds She otherwise feels well, reports her diet has been strict to watch her sugars She does like ice cream but does not eat much desserts Walks with a walker, no recent falls, tries to go walking every day  EKG on today's visit shows normal sinus rhythm with rate 79 bpm, no significant ST or T-wave changes, left axis deviation  Other past medical history Echocardiogram from the hospital 10/01/2013 shows ejection fraction 30-35%, mention of possible anterior wall hypokinesis. This was not noted on cardiac catheterization. Repeat echocardiogram 02/23/2014 shows ejection fraction  25-30%, mildly elevated right ventricular systolic pressures, mild aortic valve stenosi for elevated cardiac enzymes     Allergies  Allergen Reactions  . Lactose Intolerance (Gi)     Pt can drink skim milk  . Hydrocodone Rash    Patient unsure of reaction, but it may have been diarrhea.  . Penicillins Rash    On scalp    Current Outpatient Prescriptions on File Prior to Visit  Medication Sig Dispense Refill  . alendronate (FOSAMAX) 70 MG tablet Take 1 tablet (70 mg total) by mouth every 7 (seven) days. Take with a full glass of water on an empty stomach. 12 tablet 3  . ALPRAZolam (XANAX) 0.5 MG tablet Take 0.5 mg by mouth 3 (three) times daily as needed for anxiety.    Marland Kitchen aspirin EC 81 MG tablet Take 81 mg by mouth daily.    . Calcium Carbonate-Vitamin D (CALTRATE 600+D PO) Take 2 tablets by mouth daily.    . carvedilol (COREG) 3.125 MG tablet TAKE 1 TABLET TWICE A DAY WITH A MEAL 180 tablet 3  . CREON 3000-9500 UNITS CPEP TAKE 1 CAPSULE THREE TIMES A DAY 270 capsule 3  . donepezil (ARICEPT) 10 MG tablet Take 1 tablet (10 mg total) by mouth at bedtime. 90 tablet 2  . ergocalciferol (VITAMIN D2) 50000 UNITS capsule Take 50,000 Units by mouth once a week.    . furosemide (LASIX) 20 MG tablet TAKE 1 TABLET DAILY (Patient taking differently: TAKE 1 TABLET EVERY OTHER DAY.) 90 tablet 3  . KLOR-CON M10 10 MEQ tablet TAKE 1 TABLET DAILY 90 tablet 3  . lisinopril (PRINIVIL,ZESTRIL) 2.5 MG tablet TAKE 1 TABLET DAILY 90  tablet 3  . loratadine (CLARITIN) 10 MG tablet Take 1 tablet (10 mg total) by mouth daily as needed for allergies. 90 tablet 3  . NEXIUM 40 MG capsule TAKE 1 CAPSULE DAILY BEFORE BREAKFAST 90 capsule 1  . NON FORMULARY Oxygen 2 liters at bedtimes.    Vladimir Faster Glycol-Propyl Glycol (SYSTANE) 0.4-0.3 % GEL Apply to eye. 1-2 drops in each eye BID prn    . polyethylene glycol powder (GLYCOLAX/MIRALAX) powder Take 17 g by mouth daily as needed. 527 g 3  . predniSONE (DELTASONE) 10  MG tablet TAKE AS INSTRUCTED BY YOUR PRESCRIBER 50 tablet 2  . predniSONE (DELTASONE) 5 MG tablet TAKE 1 TABLET (5 MG) EVERY OTHER DAY. ALTERNATE WITH 10MG  TABLET EVERY OTHER DAY 50 tablet 2  . sertraline (ZOLOFT) 25 MG tablet TAKE 1 TABLET (25 MG TOTAL) BY MOUTH DAILY. TAKE AT DINNER TIME 90 tablet 3  . sodium chloride (OCEAN) 0.65 % SOLN nasal spray Place 1 spray into both nostrils as needed for congestion.    . traMADol (ULTRAM) 50 MG tablet Take 1 tablet (50 mg total) by mouth 2 (two) times daily. 180 tablet 0   No current facility-administered medications on file prior to visit.    Past Medical History  Diagnosis Date  . Osteoporosis   . Hyperlipidemia   . RLS (restless legs syndrome)   . Vitamin D deficiency   . GERD (gastroesophageal reflux disease)   . Cerumen impaction     recurrent  . Unexplained weight loss   . Cough   . Constipation   . Diarrhea   . Shingles   . Overactive bladder   . Cancer [facility])     pancreatic  . Arthritis     OA, in hands  . CHF (congestive heart failure) ([place])   . Pancreatic insufficiency ([place])   . Hiatal hernia     Past Surgical History  Procedure Laterality Date  . Cataract extraction      patient unsure of date procedure was done.  . Cataract extraction  11/08    2nd cataract  . Ercp  03/24/2012    Procedure: ENDOSCOPIC RETROGRADE CHOLANGIOPANCREATOGRAPHY (ERCP);  Surgeon: Milus Banister, MD;  Location: [facility];  Service: Endoscopy;  Laterality: N/A;  . Eus  04/02/2012    Procedure: ESOPHAGEAL ENDOSCOPIC ULTRASOUND (EUS) RADIAL;  Surgeon: Milus Banister, MD;  Location: [facility];  Service: Endoscopy;  Laterality: N/A;  EUS with FNA  . Fine needle aspiration  04/02/2012    Procedure: FINE NEEDLE ASPIRATION (FNA) LINEAR;  Surgeon: Milus Banister, MD;  Location: [facility];  Service: Endoscopy;  Laterality: N/A;  . Endoscopic retrograde cholangiopancreatography (ercp) with propofol N/A 01/28/2013    Procedure: ENDOSCOPIC  RETROGRADE CHOLANGIOPANCREATOGRAPHY (ERCP) WITH PROPOFOL;  Surgeon: Milus Banister, MD;  Location: [facility];  Service: Endoscopy;  Laterality: N/A;  . Cardiac catheterization  10/14    [facility];no stent    Social History  reports that she quit smoking about 23 years ago. Her smoking use included Cigarettes. She has a 5 pack-year smoking history. She has never used smokeless tobacco. She reports that she does not drink alcohol or use illicit drugs.  Family History family history includes Alcohol abuse in her son; Cancer in her other; Diabetes in her brother and sister; Fibromyalgia in her daughter; Heart disease in her brother, brother, and mother; Heart failure in her father; Hypertension in her sister; Pneumonia in her father.   Review of Systems  Constitutional: Negative.  Respiratory: Negative.   Cardiovascular: Negative.   Gastrointestinal: Negative.   Musculoskeletal: Positive for gait problem.  Neurological: Negative.   Hematological: Negative.   Psychiatric/Behavioral: Negative.   All other systems reviewed and are negative.   BP 122/70 mmHg  Pulse 79  Ht 5' (1.524 m)  Wt 122 lb (55.339 kg)  BMI 23.83 kg/m2  Physical Exam  Constitutional: She is oriented to person, place, and time. She appears well-developed and well-nourished.  Unstable gait  HENT:  Head: Normocephalic.  Nose: Nose normal.  Mouth/Throat: Oropharynx is clear and moist.  Eyes: Conjunctivae are normal. Pupils are equal, round, and reactive to light.  Neck: Normal range of motion. Neck supple. No JVD present.  Cardiovascular: Normal rate, regular rhythm, S1 normal, S2 normal, normal heart sounds and intact distal pulses.  Exam reveals no gallop and no friction rub.   No murmur heard. Trace lower extremity edema bilaterally  Pulmonary/Chest: Effort normal and breath sounds normal. No respiratory distress. She has no wheezes. She has no rales. She exhibits no tenderness.  Abdominal: Soft. Bowel sounds  are normal. She exhibits no distension. There is no tenderness.  Musculoskeletal: Normal range of motion. She exhibits no edema or tenderness.  Lymphadenopathy:    She has no cervical adenopathy.  Neurological: She is alert and oriented to person, place, and time. Coordination normal.  Skin: Skin is warm and dry. No rash noted. No erythema.  Psychiatric: She has a normal mood and affect. Her behavior is normal. Judgment and thought content normal.    Assessment and Plan  Nursing note and vitals reviewed.

## 2016-01-08 NOTE — Assessment & Plan Note (Signed)
She denies any symptoms concerning for orthostasis, Not taking Lasix, lisinopril held for low blood pressure Appears stable

## 2016-01-08 NOTE — Assessment & Plan Note (Signed)
Currently not on a cholesterol medication, cholesterol in a reasonable range

## 2016-01-08 NOTE — Patient Instructions (Signed)
You are doing well. No medication changes were made.  Please call us if you have new issues that need to be addressed before your next appt.  Your physician wants you to follow-up in: 6 months.  You will receive a reminder letter in the mail two months in advance. If you don't receive a letter, please call our office to schedule the follow-up appointment.   

## 2016-01-18 ENCOUNTER — Other Ambulatory Visit: Payer: Self-pay | Admitting: Family Medicine

## 2016-01-22 DIAGNOSIS — E119 Type 2 diabetes mellitus without complications: Secondary | ICD-10-CM | POA: Diagnosis not present

## 2016-01-22 DIAGNOSIS — H52203 Unspecified astigmatism, bilateral: Secondary | ICD-10-CM | POA: Diagnosis not present

## 2016-01-22 DIAGNOSIS — Z961 Presence of intraocular lens: Secondary | ICD-10-CM | POA: Diagnosis not present

## 2016-01-22 DIAGNOSIS — H43813 Vitreous degeneration, bilateral: Secondary | ICD-10-CM | POA: Diagnosis not present

## 2016-01-22 LAB — HM DIABETES EYE EXAM

## 2016-01-25 ENCOUNTER — Encounter: Payer: Self-pay | Admitting: Family Medicine

## 2016-02-07 ENCOUNTER — Other Ambulatory Visit: Payer: Self-pay | Admitting: Cardiovascular Disease

## 2016-02-07 ENCOUNTER — Other Ambulatory Visit: Payer: Self-pay | Admitting: Family Medicine

## 2016-02-07 NOTE — Telephone Encounter (Signed)
Requested Prescriptions   Pending Prescriptions Disp Refills  . KLOR-CON M10 10 MEQ tablet [Pharmacy Med Name: KLOR-CON M10 ER TABS 10MEQ] 90 tablet 3    Sig: TAKE 1 TABLET DAILY

## 2016-02-26 ENCOUNTER — Telehealth: Payer: Self-pay | Admitting: Family Medicine

## 2016-02-26 NOTE — Telephone Encounter (Signed)
I have not rec her readings yet (I will rev when I get them) I think a protien drink would be fine

## 2016-02-26 NOTE — Telephone Encounter (Signed)
Daughter called - pt is at [facility] and it losing weight. Daughter wants to know if a high protein drink would be ok or if she should start drinking ensure or similar item. The nurse at [facility] have stated that otherwise she is fine, [facility] will be faxing over her diabetes readings.  cb number is [phone]

## 2016-02-27 NOTE — Telephone Encounter (Signed)
Spoke with Rollene Fare and she can put orders in for the UA and culture if needed she just needed to know what dpt pt is in. [place] and no answer just kept going to voicemail, will try to call back later  Dr. Glori Bickers pt's daughter still needs to know what protein Shake she can drink because the one she looked at had a lot of

## 2016-02-27 NOTE — Telephone Encounter (Signed)
Pt's daughter notified of Dr. Marliss Coots comments.  Webb Silversmith, NP: Daughter advise me that pt is in the [facility] assisted living dpt. room 218, please put orders in

## 2016-02-27 NOTE — Telephone Encounter (Signed)
Daughter called  Read dr note to her.   Daughter wanted to know what protein drink would dr tower recommend  Daughter stated ms Knipp is a little confused and having to go to bathroom a lot She wanted to know if  regina could see her at [facility] instead of bringing her in for an appointment for an uti

## 2016-02-27 NOTE — Telephone Encounter (Signed)
There is a brand called Ensure high protein - that has less sugar content and more protein than most -unsure what flavors it comes in

## 2016-02-28 ENCOUNTER — Telehealth: Payer: Self-pay | Admitting: Family Medicine

## 2016-02-28 NOTE — Telephone Encounter (Signed)
Daughter called and want to know if pt can be seen on Friday at [facility] instead of having an office visit in the office?  cb number is [phone]

## 2016-02-28 NOTE — Telephone Encounter (Signed)
Yes I can just see her at [facility], but Leafy Ro wanted me to see if there was anyway to have her seen by Dr. Glori Bickers on Friday.

## 2016-02-28 NOTE — Telephone Encounter (Signed)
Pt has appt with you on Friday---does she need to come in or can this be addressed at [facility]?--please advise (see other telephone note) you have spoken to [facility] about pt and ordering urinalysis (i think it was)

## 2016-02-28 NOTE — Telephone Encounter (Signed)
Will call Leafy Ro, RN at [facility] RN to have ordered

## 2016-02-29 ENCOUNTER — Telehealth: Payer: Self-pay

## 2016-02-29 MED ORDER — TRAMADOL HCL 50 MG PO TABS: 50.0000 mg | ORAL_TABLET | Freq: Two times a day (BID) | ORAL | Status: DC

## 2016-02-29 NOTE — Telephone Encounter (Signed)
Please call in 10 d supply to local pharmacy I pref pharmacare but change to pref pharmacy if needed  thanks

## 2016-02-29 NOTE — Telephone Encounter (Signed)
Rx printed and placed in your inbox for you to sign tomorrow, it usually takes around a week to a week and a half to get mail order pharmacy meds, do you want to send in a small dose to a local pharmacy? Pt may not get mail order in time before she runs out next wed since Rx wont be faxed to the mail order pharmacy until Friday

## 2016-02-29 NOTE — Telephone Encounter (Signed)
Yes, we are checking her urine. I will see her Friday. I will discuss Megace with her PCP.

## 2016-02-29 NOTE — Telephone Encounter (Signed)
Caryl Pina nurse at [facility] left v/m; pt has enough tramadol to last until next Wed morning. Caryl Pina request cb with response; does pt need to get smaller quantity at local pharmacy and order 90 day refill from mail order or possibly pt might get mail order prior to running out of tramadol. Last printed # 180 on 01/18/16. [facility].last seen 10/30/2015.

## 2016-02-29 NOTE — Telephone Encounter (Signed)
Please print px for 180 no ref so it can be faxed to mail order  I do not know how long it takes her co to get mail order - ask how long it usually takes -thanks

## 2016-02-29 NOTE — Telephone Encounter (Signed)
[name] DPR signed left v/m; [name] said that Avie Echevaria NP is to see pt at [facility] on 03/01/16; [name] wants R Baity NP to be aware that Megace has helped with appetite in the past and [name] wants to know if pt can take Megace. Pt starting Ensure shakes today. [name] said pt has lost some weight and and wants to make sure urine is going to be checked. [name] request cb after pt is seen.

## 2016-03-01 ENCOUNTER — Ambulatory Visit: Payer: Self-pay | Admitting: Internal Medicine

## 2016-03-01 ENCOUNTER — Non-Acute Institutional Stay: Payer: Medicare Other | Admitting: Internal Medicine

## 2016-03-01 ENCOUNTER — Encounter: Payer: Self-pay | Admitting: Internal Medicine

## 2016-03-01 VITALS — BP 122/68 | HR 70 | Temp 98.5°F | Resp 18

## 2016-03-01 DIAGNOSIS — R4189 Other symptoms and signs involving cognitive functions and awareness: Secondary | ICD-10-CM | POA: Diagnosis not present

## 2016-03-01 DIAGNOSIS — R630 Anorexia: Secondary | ICD-10-CM

## 2016-03-01 DIAGNOSIS — R410 Disorientation, unspecified: Secondary | ICD-10-CM | POA: Diagnosis not present

## 2016-03-01 DIAGNOSIS — R634 Abnormal weight loss: Secondary | ICD-10-CM | POA: Diagnosis not present

## 2016-03-01 MED ORDER — TRAMADOL HCL 50 MG PO TABS: 50.0000 mg | ORAL_TABLET | Freq: Two times a day (BID) | ORAL | Status: DC

## 2016-03-01 MED ORDER — MEGESTROL ACETATE 400 MG/10ML PO SUSP: 400.0000 mg | Freq: Every day | ORAL | Status: DC

## 2016-03-01 NOTE — Telephone Encounter (Signed)
I realized yesterday that the Rx I did for the Tramadol never printed, I have tried to print it twice and it still isn't printing, routing Rx back to Dr. Glori Bickers so she can print the 3 month supply Rx so I can fax it to her mail order

## 2016-03-01 NOTE — Addendum Note (Signed)
Addended by: Jearld Fenton on: 03/01/2016 12:38 PM   Modules accepted: Orders

## 2016-03-01 NOTE — Telephone Encounter (Signed)
Rx faxed to [facility], and called nursing home to let them know Rx was faxed and asked where to call local Rx to, nurse tech didn't know so she will call me back to let me know where to send the local Rx to

## 2016-03-01 NOTE — Progress Notes (Signed)
Subjective:    Patient ID: Bridget Walker, female    DOB: 12-02-1925, 82 y.o.   MRN: [id number]  HPI  Asked to evaluate resident in [address]  Seen with daughter. Daughter concerned with increasing confusion. Worse over the last 2 months. Prior, had some sundowning issues, but now confused during the day as well. Some delusions- people taking things out of her apartment, coming in her apartment and watching her TV. No aggressive behaviors. Some mild cognitive impairment, on Aricept. No definitive diagnosis of Alzheimer's or vascular dementia. No CT head in EMR. Continues to sleep well. Appetite poor. Down 8 lbs in the last 2 weeks alone. She was on Megace in the past and daughter would like that restarted. No chest pain or shortness of breath. Bowels okay. No urinary incontinence. Assist with bathing and dressing daily.  Review of Systems  Past Medical History  Diagnosis Date  . Osteoporosis   . Hyperlipidemia   . RLS (restless legs syndrome)   . Vitamin D deficiency   . GERD (gastroesophageal reflux disease)   . Cerumen impaction     recurrent  . Unexplained weight loss   . Cough   . Constipation   . Diarrhea   . Shingles   . Overactive bladder   . Cancer [facility])     pancreatic  . Arthritis     OA, in hands  . CHF (congestive heart failure) ([place])   . Pancreatic insufficiency ([place])   . Hiatal hernia     Current Outpatient Prescriptions  Medication Sig Dispense Refill  . alendronate (FOSAMAX) 70 MG tablet Take 1 tablet (70 mg total) by mouth every 7 (seven) days. Take with a full glass of water on an empty stomach. 12 tablet 3  . ALPRAZolam (XANAX) 0.5 MG tablet Take 0.5 mg by mouth 3 (three) times daily as needed for anxiety.    Marland Kitchen aspirin EC 81 MG tablet Take 81 mg by mouth daily.    . Calcium Carbonate-Vitamin D (CALTRATE 600+D PO) Take 2 tablets by mouth daily.    . carvedilol (COREG) 3.125 MG tablet TAKE 1 TABLET TWICE A DAY WITH A MEAL 180 tablet 3  . CREON 3000-9500 units  CPEP TAKE 1 CAPSULE THREE TIMES A DAY 210 capsule 0  . donepezil (ARICEPT) 10 MG tablet Take 1 tablet (10 mg total) by mouth at bedtime. 90 tablet 2  . ergocalciferol (VITAMIN D2) 50000 UNITS capsule Take 50,000 Units by mouth once a week.    . furosemide (LASIX) 20 MG tablet Take 1 tablet (20 mg total) by mouth daily as needed. 90 tablet 3  . KLOR-CON M10 10 MEQ tablet TAKE 1 TABLET DAILY 90 tablet 3  . loratadine (CLARITIN) 10 MG tablet Take 1 tablet (10 mg total) by mouth daily as needed for allergies. 90 tablet 3  . NEXIUM 40 MG capsule TAKE 1 CAPSULE DAILY BEFORE BREAKFAST 90 capsule 1  . NON FORMULARY Oxygen 2 liters at bedtimes.    Vladimir Faster Glycol-Propyl Glycol (SYSTANE) 0.4-0.3 % GEL Apply to eye. 1-2 drops in each eye BID prn    . polyethylene glycol powder (GLYCOLAX/MIRALAX) powder Take 17 g by mouth daily as needed. 527 g 3  . predniSONE (DELTASONE) 10 MG tablet TAKE AS INSTRUCTED BY YOUR PRESCRIBER 50 tablet 2  . predniSONE (DELTASONE) 5 MG tablet TAKE 1 TABLET (5 MG) EVERY OTHER DAY. ALTERNATE WITH 10MG  TABLET EVERY OTHER DAY 50 tablet 2  . sertraline (ZOLOFT) 25 MG tablet  TAKE 1 TABLET DAILY, TAKE AT DINNER TIME 90 tablet 0  . sodium chloride (OCEAN) 0.65 % SOLN nasal spray Place 1 spray into both nostrils as needed for congestion.    . traMADol (ULTRAM) 50 MG tablet Take 1 tablet (50 mg total) by mouth 2 (two) times daily. 20 tablet 0  . traMADol (ULTRAM) 50 MG tablet Take 1 tablet (50 mg total) by mouth 2 (two) times daily. 180 tablet 0   No current facility-administered medications for this visit.    Allergies  Allergen Reactions  . Lactose Intolerance (Gi)     Pt can drink skim milk  . Hydrocodone Rash    Patient unsure of reaction, but it may have been diarrhea.  . Penicillins Rash    On scalp    Family History  Problem Relation Age of Onset  . Diabetes Sister   . Hypertension Sister   . Heart disease Brother   . Fibromyalgia Daughter   . Alcohol abuse Son     . Heart disease Brother   . Diabetes Brother   . Heart disease Mother   . Pneumonia Father   . Heart failure Father   . Cancer Other     niece had breast and bone cancer    Social History   Social History  . Marital Status: Widowed    Spouse Name: N/A  . Number of Children: 3  . Years of Education: N/A   Occupational History  .      RETIRED    Social History Main Topics  . Smoking status: Former Smoker -- 0.25 packs/day for 20 years    Types: Cigarettes    Quit date: 03/23/1992  . Smokeless tobacco: Never Used     Comment: PT SMOKED 2 CIGARETTES A DAY   . Alcohol Use: No  . Drug Use: No  . Sexual Activity: No     Comment: not applicable   Other Topics Concern  . Not on file   Social History Narrative   Resides at [facility] in [place] ([place])   Widow     Constitutional: Denies fever, malaise, fatigue, headache or abrupt weight changes.  HEENT: Denies eye pain, eye redness, ear pain, ringing in the ears, wax buildup, runny nose, nasal congestion, bloody nose, or sore throat. Respiratory: Pt reports shortness of breath with exertion. Denies difficulty breathing, cough or sputum production.   Cardiovascular: Pt reports swelling in her legs. Denies chest pain, chest tightness, palpitations or swelling in the hands.  Gastrointestinal: Denies abdominal pain, bloating, constipation, diarrhea or blood in the stool.  GU: Denies urgency, frequency, pain with urination, burning sensation, blood in urine, odor or discharge. Neurological: Pt reports difficulty with memory. Denies dizziness, difficulty with speech or problems with balance and coordination.    No other specific complaints in a complete review of systems (except as listed in HPI above).     Objective:   Physical Exam   BP 122/68 mmHg  Pulse 70  Temp(Src) 98.5 F (36.9 C)  Resp 18  SpO2 89% Wt Readings from Last 3 Encounters:  01/08/16 122 lb (55.339 kg)  10/30/15  124 lb 8 oz (56.473 kg)  10/16/15 121 lb 6.4 oz (55.067 kg)    General: Appears her stated age, well developed, well nourished in NAD. Cardiovascular: Normal rate with irregular rhythm. Trace BLE edema. N Pulmonary/Chest: Normal effort and positive vesicular breath sounds. No respiratory distress. No wheezes, rales or ronchi noted.  Abdomen: Soft and  nontender. Normal bowel sounds,. No distention or masses noted.  Neurological: Alert and mildly confused.   BMET    Component Value Date/Time   NA 139 10/30/2015 1607   NA 138 08/25/2014 1530   NA 134* 02/23/2014 0407   K 4.6 10/30/2015 1607   K 4.1 02/23/2014 0407   CL 102 10/30/2015 1607   CL 102 02/23/2014 0407   CO2 31 10/30/2015 1607   CO2 29 02/23/2014 0407   GLUCOSE 121* 10/30/2015 1607   GLUCOSE 153* 08/25/2014 1530   GLUCOSE 97 02/23/2014 0407   BUN 21 10/30/2015 1607   BUN 19 08/25/2014 1530   BUN 26* 02/23/2014 0407   CREATININE 0.89 10/30/2015 1607   CREATININE 1.07 09/09/2014 1607   CREATININE 1.19 02/23/2014 0407   CALCIUM 9.4 10/30/2015 1607   CALCIUM 8.6 02/23/2014 0407   GFRNONAA 56* 08/25/2014 1530   GFRNONAA 41* 02/23/2014 0407   GFRAA 65 08/25/2014 1530   GFRAA 48* 02/23/2014 0407    Lipid Panel     Component Value Date/Time   CHOL 201* 10/30/2015 1607   TRIG 103.0 10/30/2015 1607   HDL 67.90 10/30/2015 1607   CHOLHDL 3 10/30/2015 1607   VLDL 20.6 10/30/2015 1607   LDLCALC 113* 10/30/2015 1607    CBC    Component Value Date/Time   WBC 7.5 09/09/2014 1607   WBC 10.7 08/25/2014 1530   WBC 8.6 02/23/2014 0407   RBC 3.91 09/09/2014 1607   RBC 3.68* 08/25/2014 1530   RBC 3.64* 02/23/2014 0407   HGB 11.7* 09/09/2014 1607   HGB 11.7* 02/23/2014 0407   HCT 36.1 09/09/2014 1607   HCT 34.4* 02/23/2014 0407   PLT 413* 09/09/2014 1607   PLT 208 02/23/2014 0407   MCV 92.3 09/09/2014 1607   MCV 95 02/23/2014 0407   MCH 29.9 09/09/2014 1607   MCH 31.0 08/25/2014 1530   MCH 32.1 02/23/2014 0407    MCHC 32.4 09/09/2014 1607   MCHC 32.8 08/25/2014 1530   MCHC 33.9 02/23/2014 0407   RDW 14.2 09/09/2014 1607   RDW 14.1 08/25/2014 1530   RDW 15.0* 02/23/2014 0407   LYMPHSABS 1.2 09/09/2014 1607   LYMPHSABS 1.3 08/25/2014 1530   LYMPHSABS 0.8* 02/23/2014 0407   MONOABS 0.3 09/09/2014 1607   MONOABS 0.4 02/23/2014 0407   EOSABS 0.0 09/09/2014 1607   EOSABS 0.0 08/25/2014 1530   EOSABS 0.1 02/23/2014 0407   BASOSABS 0.0 09/09/2014 1607   BASOSABS 0.0 08/25/2014 1530   BASOSABS 0.0 02/23/2014 0407    Hgb A1C Lab Results  Component Value Date   HGBA1C 6.6* 10/30/2015        Assessment & Plan:   Confusion and weight loss:  ? If this is related to worsening dementia Will check CMET, CBC, A1C, amylase and lipase Will discuss with Dr. Glori Bickers appetite stimulant- Remeron versus Megace Urinalysis trace leuks, will send culture Family is considering neurology referral for evaluation of cognitive impairment for definitive diagnosis  Will follow up with pt and family after labs are back, with next plan of action

## 2016-03-01 NOTE — Telephone Encounter (Signed)
The Rx did print it just printed at a different computer, Rx placed in Dr. Marliss Coots inbox for signing

## 2016-03-01 NOTE — Telephone Encounter (Signed)
Nurse tech called back and pt uses [facility] [address] Rx called in as prescribed and nurse tech will advise pt's daughter to pick up local Rx

## 2016-03-04 DIAGNOSIS — R4182 Altered mental status, unspecified: Secondary | ICD-10-CM | POA: Diagnosis not present

## 2016-03-04 DIAGNOSIS — E119 Type 2 diabetes mellitus without complications: Secondary | ICD-10-CM | POA: Diagnosis not present

## 2016-03-04 DIAGNOSIS — R634 Abnormal weight loss: Secondary | ICD-10-CM | POA: Diagnosis not present

## 2016-03-06 ENCOUNTER — Telehealth: Payer: Self-pay

## 2016-03-06 NOTE — Telephone Encounter (Signed)
I just saw the labs-they were ok - sugar was up slightly but other labs ok and urine cx not positive  Will CC to [place] as well since she saw the pt last  I did not see a med compl form they mentioned

## 2016-03-06 NOTE — Telephone Encounter (Signed)
Nurse at [facility] said as long as her labs were stable then she didn't need med compliance from filled out, I read her Dr. Marliss Coots comments and verbalized understanding  I also called pt's daughter and notified her of the lab results too  Labs placed in [facility] box for review

## 2016-03-06 NOTE — Telephone Encounter (Signed)
Kim RN with [facility] assisted living left v/m; Maudie Mercury wanted to ck on status of lab results and medication compliance form that was faxed to [facility] on 03/05/16. Based on lab results wants to know if there needs to be any changes to pts meds.Kim request cb. Sending to [name] who saw pt in office 10/30/15 and [name] since pt was seen in nursing home 03/01/16.

## 2016-03-06 NOTE — Telephone Encounter (Signed)
I am not seeing her--- Rollene Fare saw her in [place] on Dr Marliss Coots behalf. I will send to her--- she will be back tomorrow

## 2016-03-19 ENCOUNTER — Other Ambulatory Visit: Payer: Self-pay | Admitting: Internal Medicine

## 2016-03-19 ENCOUNTER — Other Ambulatory Visit: Payer: Self-pay | Admitting: Cardiovascular Disease

## 2016-03-19 NOTE — Telephone Encounter (Signed)
Please refill 300 ml which I believe is a 30 day supply with 5 refills  Thanks

## 2016-03-20 NOTE — Telephone Encounter (Signed)
done

## 2016-03-26 ENCOUNTER — Other Ambulatory Visit: Payer: Self-pay | Admitting: Family Medicine

## 2016-04-01 ENCOUNTER — Ambulatory Visit: Payer: Self-pay | Admitting: Family Medicine

## 2016-04-10 ENCOUNTER — Ambulatory Visit: Payer: Self-pay | Admitting: Family Medicine

## 2016-04-16 ENCOUNTER — Ambulatory Visit: Payer: Self-pay | Admitting: Family Medicine

## 2016-04-16 ENCOUNTER — Telehealth: Payer: Self-pay | Admitting: Internal Medicine

## 2016-04-16 NOTE — Telephone Encounter (Signed)
Per Dr. Annamaria Boots, scheduled patient to see Eric Form on Thursday, 04/18/2016 at 11:30am.   Patient's daughter aware of appointment.

## 2016-04-16 NOTE — Telephone Encounter (Signed)
Fine to see NP

## 2016-04-16 NOTE — Telephone Encounter (Signed)
Patients daughter calling, she said that her mom is having trouble breathing, more labored than usual.   Daughter wants her to be seen on Thursday.  Joellen Jersey, can we add patient to Young's schedule on Thursday?  Please advise.

## 2016-04-16 NOTE — Telephone Encounter (Signed)
Patient's daughter is calling back.  Says that her mom's breathing is steadily getting worse.  She is having to use the oxygen more. Advised her that if she is worsening, she needs to go to ER.  She said that she is not that bad, she just needs to be seen this week.  Offered her an appointment today with Rexene Edison, but she refused appointment saying that she can only bring her on Thursday.  Once again, I advised her that I would ask Joellen Jersey where I can place her on the schedule for Thursday and will call her back.    Dr. Annamaria Boots, please advise if patient can be seen by NP or if we need to work her into your schedule on Thursday? Joellen Jersey - If we need to work in on schedule, please advise where to place patient.

## 2016-04-17 ENCOUNTER — Ambulatory Visit: Payer: Self-pay | Admitting: Internal Medicine

## 2016-04-18 ENCOUNTER — Telehealth: Payer: Self-pay | Admitting: Acute Care

## 2016-04-18 ENCOUNTER — Encounter: Payer: Self-pay | Admitting: Acute Care

## 2016-04-18 ENCOUNTER — Ambulatory Visit (INDEPENDENT_AMBULATORY_CARE_PROVIDER_SITE_OTHER)
Admission: RE | Admit: 2016-04-18 | Discharge: 2016-04-18 | Disposition: A | Discharge status: Home / Self Care | Payer: Medicare Other | Source: Ambulatory Visit | Attending: Acute Care | Admitting: Acute Care

## 2016-04-18 ENCOUNTER — Ambulatory Visit (INDEPENDENT_AMBULATORY_CARE_PROVIDER_SITE_OTHER): Payer: Medicare Other | Admitting: Acute Care

## 2016-04-18 VITALS — BP 132/74 | HR 75 | Wt 117.0 lb

## 2016-04-18 DIAGNOSIS — J181 Lobar pneumonia, unspecified organism: Secondary | ICD-10-CM

## 2016-04-18 DIAGNOSIS — J449 Chronic obstructive pulmonary disease, unspecified: Secondary | ICD-10-CM | POA: Diagnosis not present

## 2016-04-18 DIAGNOSIS — R911 Solitary pulmonary nodule: Secondary | ICD-10-CM | POA: Diagnosis not present

## 2016-04-18 DIAGNOSIS — R0602 Shortness of breath: Secondary | ICD-10-CM | POA: Diagnosis not present

## 2016-04-18 MED ORDER — METHYLPREDNISOLONE ACETATE 80 MG/ML IJ SUSP
80.0000 mg | Freq: Once | INTRAMUSCULAR | Status: AC
  Administered 2016-04-18: 80 mg via INTRAMUSCULAR

## 2016-04-18 MED ORDER — LEVALBUTEROL HCL 0.63 MG/3ML IN NEBU
0.6300 mg | INHALATION_SOLUTION | Freq: Once | RESPIRATORY_TRACT | Status: AC
  Administered 2016-04-18: 0.63 mg via RESPIRATORY_TRACT

## 2016-04-18 NOTE — Patient Instructions (Addendum)
It is nice to see you today. Your CXR today was clear. Continue wearing your oxygen at night at 2 Liters/ minute We will walk you today in the office to see if you are dropping your oxygen level with walking. We will order daytime oxygen through your medical equipment company. We will give you a nebulizer treatment today in the office. Depomedrol injection 80 today We will start Breo Inhaler, 1 puff once daily Rinse your mouth with water after use. Follow up with Dr. Annamaria Boots in 1 month. Please contact office for sooner follow up if symptoms do not improve or worsen or seek emergency care

## 2016-04-18 NOTE — Progress Notes (Signed)
History of Present Illness Bridget Walker is a 82 y.o. female with history of COPD mixed type. She wears nocturnal oxygen at 2L [place], and is a patient of Dr. Annamaria Boots.   5/11/2017Acute OV: Pt. Presents to the office with complaint of Shortness of breath x 2 weeks. She does sleep with oxygen 2 L / min. She denies productive cough, fever, chest pain,orthopnea,hemoptysis,no change in color of sputum, denies leg pain or swelling.She is not currently on any maintenance inhalers.  Tests 04/18/2016 IMPRESSION: 1. No definitive change from prior to suggest acute disease. 2. Chronic cardiomegaly and large hiatal hernia containing colon and stomach. 3. COPD and lung scarring.   Past medical hx Past Medical History  Diagnosis Date  . Osteoporosis   . Hyperlipidemia   . RLS (restless legs syndrome)   . Vitamin D deficiency   . GERD (gastroesophageal reflux disease)   . Cerumen impaction     recurrent  . Unexplained weight loss   . Cough   . Constipation   . Diarrhea   . Shingles   . Overactive bladder   . Cancer [facility])     pancreatic  . Arthritis     OA, in hands  . CHF (congestive heart failure) ([place])   . Pancreatic insufficiency ([place])   . Hiatal hernia      Past surgical hx, Family hx, Social hx all reviewed.  Current Outpatient Prescriptions on File Prior to Visit  Medication Sig  . alendronate (FOSAMAX) 70 MG tablet Take 1 tablet (70 mg total) by mouth every 7 (seven) days. Take with a full glass of water on an empty stomach.  . ALPRAZolam (XANAX) 0.5 MG tablet Take 0.5 mg by mouth 3 (three) times daily as needed for anxiety.  Marland Kitchen aspirin EC 81 MG tablet Take 81 mg by mouth daily.  . Calcium Carbonate-Vitamin D (CALTRATE 600+D PO) Take 2 tablets by mouth daily.  . carvedilol (COREG) 3.125 MG tablet TAKE 1 TABLET TWICE A DAY WITH A MEAL  . CREON 3000-9500 units CPEP TAKE 1 CAPSULE THREE TIMES A DAY  . donepezil (ARICEPT) 10 MG tablet Take 1 tablet (10 mg total) by mouth at  bedtime.  . ergocalciferol (VITAMIN D2) 50000 UNITS capsule Take 50,000 Units by mouth once a week.  . furosemide (LASIX) 20 MG tablet TAKE 1 TABLET DAILY  . KLOR-CON M10 10 MEQ tablet TAKE 1 TABLET DAILY  . lisinopril (PRINIVIL,ZESTRIL) 2.5 MG tablet TAKE 1 TABLET DAILY  . loratadine (CLARITIN) 10 MG tablet Take 1 tablet (10 mg total) by mouth daily as needed for allergies.  . megestrol (MEGACE) 40 MG/ML suspension TAKE 10 ML BY MOUTH EVERY DAY  . NEXIUM 40 MG capsule TAKE 1 CAPSULE DAILY BEFORE BREAKFAST  . NON FORMULARY Oxygen 2 liters at bedtimes.  Vladimir Faster Glycol-Propyl Glycol (SYSTANE) 0.4-0.3 % GEL Apply to eye. 1-2 drops in each eye BID prn  . polyethylene glycol powder (GLYCOLAX/MIRALAX) powder Take 17 g by mouth daily as needed.  . predniSONE (DELTASONE) 10 MG tablet TAKE AS INSTRUCTED BY YOUR PRESCRIBER  . predniSONE (DELTASONE) 5 MG tablet TAKE 1 TABLET (5 MG) EVERY OTHER DAY. ALTERNATE WITH 10MG  TABLET EVERY OTHER DAY  . sertraline (ZOLOFT) 25 MG tablet TAKE 1 TABLET DAILY, TAKE AT DINNER TIME  . sodium chloride (OCEAN) 0.65 % SOLN nasal spray Place 1 spray into both nostrils as needed for congestion.  . traMADol (ULTRAM) 50 MG tablet Take 1 tablet (50 mg total) by mouth 2 (  two) times daily.   No current facility-administered medications on file prior to visit.     Allergies  Allergen Reactions  . Lactose Intolerance (Gi)     Pt can drink skim milk  . Hydrocodone Rash    Patient unsure of reaction, but it may have been diarrhea.  . Penicillins Rash    On scalp    Review Of Systems:  Constitutional:   No  weight loss, night sweats,  Fevers, chills, fatigue, or  lassitude.  HEENT:   No headaches,  Difficulty swallowing,  Tooth/dental problems, or  Sore throat,                No sneezing, itching, ear ache, nasal congestion, post nasal drip,   CV:  No chest pain,  Orthopnea, PND, + swelling in lower extremities, anasarca, dizziness, palpitations, syncope.   GI  No  heartburn, indigestion, abdominal pain, nausea, vomiting, diarrhea, change in bowel habits, loss of appetite, bloody stools.   Resp: + shortness of breath with exertion less at rest.  No excess mucus, no productive cough,  No non-productive cough,  No coughing up of blood.  No change in color of mucus.  No wheezing.  No chest wall deformity  Skin: no rash or lesions.  GU: no dysuria, change in color of urine, no urgency or frequency.  No flank pain, no hematuria   MS:  No joint pain or swelling.  No decreased range of motion.  No back pain.  Psych:  No change in mood or affect. No depression or anxiety.  No memory loss.   Vital Signs BP 132/74 mmHg  Pulse 75  Wt 117 lb (53.071 kg)  SpO2 90%   Physical Exam:  General- No distress,  A&Ox3, elderly patient using walker ENT: No sinus tenderness, TM clear, pale nasal mucosa, no oral exudate,no post nasal drip, no LAN Cardiac: S1, S2, regular rate and rhythm, no murmur Chest: + wheeze/no  rales/ dullness; no accessory muscle use, no nasal flaring, no sternal retractions Abd.: Soft Non-tender Ext: No clubbing cyanosis, edema Neuro:  normal strength Skin: No rashes, warm and dry Psych: normal mood and behavior   Assessment/Plan  COPD mixed type (HCC) Dyspnea Mild exacerbation Plan:  Your CXR today was clear. Continue wearing your oxygen at night at 2 Liters/ minute We will walk you today in the office to see if you are dropping your oxygen level with walking. We will order daytime oxygen through your medical equipment company. We will give you a nebulizer treatment today in the office. Depomedrol injection 80 today We will start Breo Inhaler, 1 puff once daily Rinse your mouth with water after use. Follow up with Dr. Annamaria Boots in 1 month. Please contact office for sooner follow up if symptoms do not improve or worsen or seek emergency care        Magdalen Spatz, NP 04/18/2016  12:48 PM

## 2016-04-18 NOTE — Progress Notes (Signed)
Patient seen in the office today and instructed on use of Breo.  Patient expressed understanding and demonstrated technique. Parke Poisson, CMA 04/18/2016

## 2016-04-18 NOTE — Assessment & Plan Note (Signed)
Dyspnea Mild exacerbation Plan:  Your CXR today was clear. Continue wearing your oxygen at night at 2 Liters/ minute We will walk you today in the office to see if you are dropping your oxygen level with walking. We will order daytime oxygen through your medical equipment company. We will give you a nebulizer treatment today in the office. Depomedrol injection 80 today We will start Breo Inhaler, 1 puff once daily Rinse your mouth with water after use. Follow up with Dr. Annamaria Boots in 1 month. Please contact office for sooner follow up if symptoms do not improve or worsen or seek emergency care

## 2016-04-18 NOTE — Telephone Encounter (Signed)
Spoke with the pt's daughter  She states that she was unaware that pt was to use o2  I went over the following instructions from today's visit:  Patient Instructions     It is nice to see you today. Your CXR today was clear. Continue wearing your oxygen at night at 2 Liters/ minute We will walk you today in the office to see if you are dropping your oxygen level with walking. We will order daytime oxygen through your medical equipment company. We will give you a nebulizer treatment today in the office. Depomedrol injection 80 today We will start Breo Inhaler, 1 puff once daily Rinse your mouth with water after use. Follow up with Dr. Annamaria Boots in 1 month. Please contact office for sooner follow up if symptoms do not improve or worsen or seek emergency care      She verbalized understanding and denies any further questions  Nothing further needed

## 2016-04-18 NOTE — Addendum Note (Signed)
Addended by: Parke Poisson E on: 04/18/2016 01:08 PM   Modules accepted: Orders

## 2016-04-20 ENCOUNTER — Telehealth: Payer: Self-pay | Admitting: Family Medicine

## 2016-04-20 DIAGNOSIS — E7800 Pure hypercholesterolemia, unspecified: Secondary | ICD-10-CM

## 2016-04-20 DIAGNOSIS — E119 Type 2 diabetes mellitus without complications: Secondary | ICD-10-CM

## 2016-04-20 NOTE — Telephone Encounter (Signed)
-----   Message from Ellamae Sia sent at 04/17/2016  3:32 PM EDT ----- Regarding: Lab orders for Thursday, 5.18.17 Lab orders for a f/u appt

## 2016-04-25 ENCOUNTER — Other Ambulatory Visit: Payer: Medicare Other

## 2016-04-25 ENCOUNTER — Ambulatory Visit (INDEPENDENT_AMBULATORY_CARE_PROVIDER_SITE_OTHER): Payer: Medicare Other

## 2016-04-25 VITALS — BP 110/72 | HR 86 | Temp 97.6°F | Wt 116.2 lb

## 2016-04-25 DIAGNOSIS — Z0000 Encounter for general adult medical examination without abnormal findings: Secondary | ICD-10-CM

## 2016-04-25 DIAGNOSIS — E119 Type 2 diabetes mellitus without complications: Secondary | ICD-10-CM

## 2016-04-25 DIAGNOSIS — E7800 Pure hypercholesterolemia, unspecified: Secondary | ICD-10-CM

## 2016-04-25 NOTE — Patient Instructions (Signed)
Bridget Walker , Thank you for taking time to come for your Medicare Wellness Visit. I appreciate your ongoing commitment to your health goals. Please review the following plan we discussed and let me know if I can assist you in the future.    This is a list of the screening recommended for you and due dates:  Health Maintenance  Topic Date Due  . Tetanus Vaccine  04/25/2017  . Hemoglobin A1C  04/28/2016  . Flu Shot  07/09/2016  . Complete foot exam   10/29/2016  . Eye exam for diabetics  01/21/2017  . DEXA scan (bone density measurement)  Completed  . Shingles Vaccine  Completed  . Pneumonia vaccines  Completed    Preventive Care for Adults  A healthy lifestyle and preventive care can promote health and wellness. Preventive health guidelines for adults include the following key practices.  . A routine yearly physical is a good way to check with your health care provider about your health and preventive screening. It is a chance to share any concerns and updates on your health and to receive a thorough exam.  . Visit your dentist for a routine exam and preventive care every 6 months. Brush your teeth twice a day and floss once a day. Good oral hygiene prevents tooth decay and gum disease.  . The frequency of eye exams is based on your age, health, family medical history, use  of contact lenses, and other factors. Follow your health care provider's ecommendations for frequency of eye exams.  . Eat a healthy diet. Foods like vegetables, fruits, whole grains, low-fat dairy products, and lean protein foods contain the nutrients you need without too many calories. Decrease your intake of foods high in solid fats, added sugars, and salt. Eat the right amount of calories for you. Get information about a proper diet from your health care provider, if necessary.  . Regular physical exercise is one of the most important things you can do for your health. Most adults should get at least 150 minutes of  moderate-intensity exercise (any activity that increases your heart rate and causes you to sweat) each week. In addition, most adults need muscle-strengthening exercises on 2 or more days a week.  Silver Sneakers may be a benefit available to you. To determine eligibility, you may visit the website: www.silversneakers.com or contact program at 206-042-1268 Mon-Fri between 8AM-8PM.   . Maintain a healthy weight. The body mass index (BMI) is a screening tool to identify possible weight problems. It provides an estimate of body fat based on height and weight. Your health care provider can find your BMI and can help you achieve or maintain a healthy weight.   For adults 20 years and older: ? A BMI below 18.5 is considered underweight. ? A BMI of 18.5 to 24.9 is normal. ? A BMI of 25 to 29.9 is considered overweight. ? A BMI of 30 and above is considered obese.   . Maintain normal blood lipids and cholesterol levels by exercising and minimizing your intake of saturated fat. Eat a balanced diet with plenty of fruit and vegetables. Blood tests for lipids and cholesterol should begin at age 4 and be repeated every 5 years. If your lipid or cholesterol levels are high, you are over 50, or you are at high risk for heart disease, you may need your cholesterol levels checked more frequently. Ongoing high lipid and cholesterol levels should be treated with medicines if diet and exercise are not working.  Marland Kitchen  If you smoke, find out from your health care provider how to quit. If you do not use tobacco, please do not start.  . If you choose to drink alcohol, please do not consume more than 2 drinks per day. One drink is considered to be 12 ounces (355 mL) of beer, 5 ounces (148 mL) of wine, or 1.5 ounces (44 mL) of liquor.  . If you are 26-52 years old, ask your health care provider if you should take aspirin to prevent strokes.  . Use sunscreen. Apply sunscreen liberally and repeatedly throughout the day. You  should seek shade when your shadow is shorter than you. Protect yourself by wearing long sleeves, pants, a wide-brimmed hat, and sunglasses year round, whenever you are outdoors.  . Once a month, do a whole body skin exam, using a mirror to look at the skin on your back. Tell your health care provider of new moles, moles that have irregular borders, moles that are larger than a pencil eraser, or moles that have changed in shape or color.

## 2016-04-25 NOTE — Addendum Note (Signed)
Addended by: Ellamae Sia on: 04/25/2016 02:31 PM   Modules accepted: Orders

## 2016-04-26 ENCOUNTER — Telehealth: Payer: Self-pay | Admitting: Internal Medicine

## 2016-04-26 ENCOUNTER — Telehealth: Payer: Self-pay | Admitting: Family Medicine

## 2016-04-26 NOTE — Telephone Encounter (Signed)
Order done and in IN box  

## 2016-04-26 NOTE — Telephone Encounter (Signed)
Orders faxed

## 2016-04-26 NOTE — Telephone Encounter (Signed)
Bridget Walker called to let you know they can do [name] fasting at [facility].  This would easier for [name]  Bridget Walker needs order faxed She will be leaving today @ noon and can you fax order over before that  Fax [phone]

## 2016-04-26 NOTE — Telephone Encounter (Signed)
Attempted to call [facility]. Was on a very long hold with no one coming to the line. Will need to call back.

## 2016-04-29 DIAGNOSIS — E7800 Pure hypercholesterolemia, unspecified: Secondary | ICD-10-CM | POA: Diagnosis not present

## 2016-04-29 DIAGNOSIS — E119 Type 2 diabetes mellitus without complications: Secondary | ICD-10-CM | POA: Diagnosis not present

## 2016-04-29 DIAGNOSIS — D649 Anemia, unspecified: Secondary | ICD-10-CM | POA: Diagnosis not present

## 2016-04-29 DIAGNOSIS — E559 Vitamin D deficiency, unspecified: Secondary | ICD-10-CM | POA: Diagnosis not present

## 2016-04-29 NOTE — Progress Notes (Signed)
Pre visit review using our clinic review tool, if applicable. No additional management support is needed unless otherwise documented below in the visit note. 

## 2016-04-29 NOTE — Progress Notes (Signed)
Subjective:   Bridget Walker is a 82 y.o. female who presents for Medicare Annual (Subsequent) preventive examination.  Review of Systems:  N/A Cardiac Risk Factors include: advanced age (>14men, >17 women);diabetes mellitus     Objective:     Vitals: BP 110/72 mmHg  Pulse 86  Temp(Src) 97.6 F (36.4 C) (Oral)  Wt 116 lb 4 oz (52.731 kg)  SpO2 94%  Body mass index is 22.7 kg/(m^2).   Tobacco History  Smoking status  . Former Smoker -- 0.25 packs/day for 20 years  . Types: Cigarettes  . Quit date: 03/23/1992  Smokeless tobacco  . Never Used    Comment: PT SMOKED 2 CIGARETTES A DAY      Counseling given: No   Past Medical History  Diagnosis Date  . Osteoporosis   . Hyperlipidemia   . RLS (restless legs syndrome)   . Vitamin D deficiency   . GERD (gastroesophageal reflux disease)   . Cerumen impaction     recurrent  . Unexplained weight loss   . Cough   . Constipation   . Diarrhea   . Shingles   . Overactive bladder   . Cancer [facility])     pancreatic  . Arthritis     OA, in hands  . CHF (congestive heart failure) ([place])   . Pancreatic insufficiency ([place])   . Hiatal hernia    Past Surgical History  Procedure Laterality Date  . Cataract extraction      patient unsure of date procedure was done.  . Cataract extraction  11/08    2nd cataract  . Ercp  03/24/2012    Procedure: ENDOSCOPIC RETROGRADE CHOLANGIOPANCREATOGRAPHY (ERCP);  Surgeon: Milus Banister, MD;  Location: [facility];  Service: Endoscopy;  Laterality: N/A;  . Eus  04/02/2012    Procedure: ESOPHAGEAL ENDOSCOPIC ULTRASOUND (EUS) RADIAL;  Surgeon: Milus Banister, MD;  Location: [facility] ENDOSCOPY;  Service: Endoscopy;  Laterality: N/A;  EUS with FNA  . Fine needle aspiration  04/02/2012    Procedure: FINE NEEDLE ASPIRATION (FNA) LINEAR;  Surgeon: Milus Banister, MD;  Location: [facility] ENDOSCOPY;  Service: Endoscopy;  Laterality: N/A;  . Endoscopic retrograde cholangiopancreatography (ercp) with propofol N/A  01/28/2013    Procedure: ENDOSCOPIC RETROGRADE CHOLANGIOPANCREATOGRAPHY (ERCP) WITH PROPOFOL;  Surgeon: Milus Banister, MD;  Location: [facility] ENDOSCOPY;  Service: Endoscopy;  Laterality: N/A;  . Cardiac catheterization  10/14    [facility];no stent   Family History  Problem Relation Age of Onset  . Diabetes Sister   . Hypertension Sister   . Heart disease Brother   . Fibromyalgia Daughter   . Alcohol abuse Son   . Heart disease Brother   . Diabetes Brother   . Heart disease Mother   . Pneumonia Father   . Heart failure Father   . Cancer Other     niece had breast and bone cancer   History  Sexual Activity  . Sexual Activity: No    Comment: not applicable    Outpatient Encounter Prescriptions as of 04/25/2016  Medication Sig  . alendronate (FOSAMAX) 70 MG tablet Take 1 tablet (70 mg total) by mouth every 7 (seven) days. Take with a full glass of water on an empty stomach.  . ALPRAZolam (XANAX) 0.5 MG tablet Take 0.5 mg by mouth 3 (three) times daily as needed for anxiety.  Marland Kitchen aspirin EC 81 MG tablet Take 81 mg by mouth daily.  . Calcium Carbonate-Vitamin D (CALTRATE 600+D PO) Take 2 tablets by  mouth daily.  . carvedilol (COREG) 3.125 MG tablet TAKE 1 TABLET TWICE A DAY WITH A MEAL  . CREON 3000-9500 units CPEP TAKE 1 CAPSULE THREE TIMES A DAY  . donepezil (ARICEPT) 10 MG tablet Take 1 tablet (10 mg total) by mouth at bedtime.  . ergocalciferol (VITAMIN D2) 50000 UNITS capsule Take 50,000 Units by mouth once a week.  . furosemide (LASIX) 20 MG tablet TAKE 1 TABLET DAILY  . KLOR-CON M10 10 MEQ tablet TAKE 1 TABLET DAILY  . lisinopril (PRINIVIL,ZESTRIL) 2.5 MG tablet TAKE 1 TABLET DAILY  . loratadine (CLARITIN) 10 MG tablet Take 1 tablet (10 mg total) by mouth daily as needed for allergies.  . megestrol (MEGACE) 40 MG/ML suspension TAKE 10 ML BY MOUTH EVERY DAY  . NEXIUM 40 MG capsule TAKE 1 CAPSULE DAILY BEFORE BREAKFAST  . NON FORMULARY Oxygen 2 liters at bedtimes.  Vladimir Faster  Glycol-Propyl Glycol (SYSTANE) 0.4-0.3 % GEL Apply to eye. 1-2 drops in each eye BID prn  . polyethylene glycol powder (GLYCOLAX/MIRALAX) powder Take 17 g by mouth daily as needed.  . predniSONE (DELTASONE) 10 MG tablet TAKE AS INSTRUCTED BY YOUR PRESCRIBER  . predniSONE (DELTASONE) 5 MG tablet TAKE 1 TABLET (5 MG) EVERY OTHER DAY. ALTERNATE WITH 10MG  TABLET EVERY OTHER DAY  . sertraline (ZOLOFT) 25 MG tablet TAKE 1 TABLET DAILY, TAKE AT DINNER TIME  . sodium chloride (OCEAN) 0.65 % SOLN nasal spray Place 1 spray into both nostrils as needed for congestion.  . traMADol (ULTRAM) 50 MG tablet Take 1 tablet (50 mg total) by mouth 2 (two) times daily.   No facility-administered encounter medications on file as of 04/25/2016.    Activities of Daily Living In your present state of health, do you have any difficulty performing the following activities: 04/25/2016  Hearing? Y  Vision? N  Difficulty concentrating or making decisions? Y  Walking or climbing stairs? Y  Dressing or bathing? Y  Doing errands, shopping? Y  Preparing Food and eating ? Y  Using the Toilet? N  In the past six months, have you accidently leaked urine? Y  Do you have problems with loss of bowel control? N  Managing your Medications? Y  Managing your Finances? Y  Housekeeping or managing your Housekeeping? Y    Patient Care Team: Abner Greenspan, MD as PCP - General Ladell Pier, MD as Attending Physician (Hematology and Oncology) Minna Merritts, MD as Consulting Physician (Cardiology)    Assessment:     Exercise Activities and Dietary recommendations Current Exercise Habits: The patient does not participate in regular exercise at present, Exercise limited by: None identified  Goals    None     Fall Risk Fall Risk  04/25/2016 06/15/2014 06/15/2014  Falls in the past year? No No No   Depression Screen PHQ 2/9 Scores 04/25/2016 06/15/2014 06/15/2014  PHQ - 2 Score 0 0 0     Cognitive Testing MMSE - Mini Mental  State Exam 04/25/2016  Orientation to time 5  Orientation to Place 5  Registration 3  Attention/ Calculation 0  Recall 3  Language- name 2 objects 0  Language- repeat 1  Language- follow 3 step command 0  Language- follow 3 step command-comments unable to complete the 3 step command  Language- read & follow direction 0  Write a sentence 0  Copy design 0  Total score 17   PLEASE NOTE: A Mini-Cog screen was completed. Maximum score is 20. A value of 0  denotes this part of [place] MMSE was not completed or the patient failed this part of the Mini-Cog screening.   Mini-Cog Screening Orientation to Time - Max 5 pts Orientation to Place - Max 5 pts Registration - Max 3 pts Recall - Max 3 pts Language Repeat - Max 1 pts Language Follow 3 Step Command - Max 3 pts  Immunization History  Administered Date(s) Administered  . Influenza Split 09/11/2011, 08/13/2012, 10/09/2015  . Influenza Whole 09/18/2007, 09/06/2008, 09/05/2009, 11/23/2010  . Influenza,inj,Quad PF,36+ Mos 08/19/2013, 08/17/2014  . Pneumococcal Conjugate-13 08/17/2014  . Pneumococcal Polysaccharide-23 10/11/2002  . Td 10/21/2005  . Zoster 12/08/2006   Screening Tests Health Maintenance  Topic Date Due  . TETANUS/TDAP  10/22/2015  . HEMOGLOBIN A1C  04/28/2016  . INFLUENZA VACCINE  07/09/2016  . FOOT EXAM  10/29/2016  . OPHTHALMOLOGY EXAM  01/21/2017  . DEXA SCAN  Completed  . ZOSTAVAX  Completed  . PNA vac Low Risk Adult  Completed      Plan:     I have personally reviewed and addressed the Medicare Annual Wellness questionnaire and have noted the following in the patient's chart:  A. Medical and social history B. Use of alcohol, tobacco or illicit drugs  C. Current medications and supplements D. Functional ability and status E.  Nutritional status F.  Physical activity G. Advance directives H. List of other physicians I.  Hospitalizations, surgeries, and ER visits in previous 12 months J.   [place] to include hearing, vision, cognitive, depression L. Referrals and appointments - none  In addition, I have reviewed and discussed with patient certain preventive protocols, quality metrics, and best practice recommendations. A written personalized care plan for preventive services as well as general preventive health recommendations were provided to patient.  See attached scanned questionnaire for additional information.   Signed,   Lindell Noe, MHA, BS, LPN Health Advisor

## 2016-04-29 NOTE — Telephone Encounter (Signed)
[facility], was on hold over 10 minutes with no response.  Wcb.

## 2016-04-29 NOTE — Progress Notes (Signed)
PCP notes:  Health maintenance:  Tetanus - postponed  Abnormal screenings:  Cognitive - Scored 17/20.   Patient concerns: None  Nurse concerns: None

## 2016-04-30 NOTE — Telephone Encounter (Signed)
Spoke with Maudie Mercury at [facility]. Advised her that we walked the pt and she qualified for exertional oxygen. Maudie Mercury states they did not receive the sats or her ov note with the order. This information has been faxed to [facility] at [phone].

## 2016-04-30 NOTE — Progress Notes (Signed)
I reviewed health advisor's note, was available for consultation, and agree with documentation and plan.  

## 2016-05-01 ENCOUNTER — Encounter: Payer: Self-pay | Admitting: Family Medicine

## 2016-05-01 ENCOUNTER — Ambulatory Visit: Payer: Self-pay

## 2016-05-01 ENCOUNTER — Ambulatory Visit (INDEPENDENT_AMBULATORY_CARE_PROVIDER_SITE_OTHER): Payer: Medicare Other | Admitting: Family Medicine

## 2016-05-01 ENCOUNTER — Telehealth: Payer: Self-pay | Admitting: Family Medicine

## 2016-05-01 VITALS — BP 124/80 | HR 68 | Temp 98.1°F | Ht 60.0 in | Wt 117.5 lb

## 2016-05-01 DIAGNOSIS — E7800 Pure hypercholesterolemia, unspecified: Secondary | ICD-10-CM

## 2016-05-01 DIAGNOSIS — J449 Chronic obstructive pulmonary disease, unspecified: Secondary | ICD-10-CM | POA: Diagnosis not present

## 2016-05-01 DIAGNOSIS — E119 Type 2 diabetes mellitus without complications: Secondary | ICD-10-CM

## 2016-05-01 DIAGNOSIS — IMO0001 Reserved for inherently not codable concepts without codable children: Secondary | ICD-10-CM

## 2016-05-01 DIAGNOSIS — M4850XS Collapsed vertebra, not elsewhere classified, site unspecified, sequela of fracture: Secondary | ICD-10-CM

## 2016-05-01 DIAGNOSIS — F0390 Unspecified dementia without behavioral disturbance: Secondary | ICD-10-CM

## 2016-05-01 DIAGNOSIS — R630 Anorexia: Secondary | ICD-10-CM

## 2016-05-01 DIAGNOSIS — Z87440 Personal history of urinary (tract) infections: Secondary | ICD-10-CM | POA: Diagnosis not present

## 2016-05-01 DIAGNOSIS — I5022 Chronic systolic (congestive) heart failure: Secondary | ICD-10-CM

## 2016-05-01 DIAGNOSIS — M810 Age-related osteoporosis without current pathological fracture: Secondary | ICD-10-CM

## 2016-05-01 DIAGNOSIS — F068 Other specified mental disorders due to known physiological condition: Secondary | ICD-10-CM | POA: Diagnosis not present

## 2016-05-01 LAB — POC URINALSYSI DIPSTICK (AUTOMATED)
BILIRUBIN UA: NEGATIVE
Blood, UA: NEGATIVE
Glucose, UA: NEGATIVE
KETONES UA: NEGATIVE
LEUKOCYTES UA: NEGATIVE
Nitrite, UA: NEGATIVE
Spec Grav, UA: 1.025
Urobilinogen, UA: 0.2
pH, UA: 6

## 2016-05-01 NOTE — Telephone Encounter (Signed)
Pt daughter requested the 6 month labs be drawn at [facility] and she needs the order put in. Her appt is set for 11/29 so labs would be the week of Thanksgiving.

## 2016-05-01 NOTE — Patient Instructions (Addendum)
Here is a handout on a tetanus shot -you are due  See if it may be covered at a pharmacy  Add 2000 iu daily of vitamin D with a meal/snack   (vitamin D level was low)  Follow up in 6 months with labs prior   Keep an eye on memory - let me know if any safety issues   I took lisinopril off of her medicine list -it has not been given in 8 months  Ask Dr Rockey Situ about the lasix and potassium

## 2016-05-01 NOTE — Telephone Encounter (Signed)
I wrote the order on  Paper px to fax them It is in the IN box

## 2016-05-01 NOTE — Progress Notes (Signed)
Pre visit review using our clinic review tool, if applicable. No additional management support is needed unless otherwise documented below in the visit note. 

## 2016-05-01 NOTE — Progress Notes (Signed)
Subjective:    Patient ID: Bridget Walker, female    DOB: 1925-05-15, 82 y.o.   MRN: [id number]  HPI Here for fu of chronic medical problems   Wt is down 5 lb in the past year with bmi of 22.9 On megace for appetite - pt states she gets hungry and eating better     (wt was as low as 111 in the past)  On prednisone for copd  BP Readings from Last 3 Encounters:  05/01/16 124/80  04/25/16 110/72  04/18/16 132/74    Has been holding lasix/K and lisinopril due to low bp for 8 mo   Rev AMW with Lesia  Low MMS score-known dementia on aricept  Is in assisted living  ? If ready for memory care unit  Family has not noticed a big change  She still goes down to eat meals/etc    Td shot - will check on coverage (will see if it is covered at a pharmacy)  Breast cancer screening -does not want further cancer screening at her age for breast or colon   OP On fosamax -tolerating well now (had worse bmd 1/17 dexa)  No falls this year Has had compresion fracture  Chronic prednisone D level is still low at 24  ua today is nl-requested this , baseline frequent urination   DM2 Lab Results  Component Value Date   HGBA1C 6.6* 10/30/2015  from facility- recent also 6.6 Well controlled -is on prednisone  Nl opth exam 2/17 w/o retinopathy   Recent pulm f/u for copd Qualified for exertional 02 On breo inhaler   Cholesterol: HDL 58 and LDL 93 from labs at her facility  Good control   CBC stable  LFTs are ok   Patient Active Problem List   Diagnosis Date Noted  . Estrogen deficiency 10/30/2015  . Mental status change 02/21/2015  . Contusion of arm, left 02/21/2015  . Elevated transaminase level 10/24/2014  . Diabetes type 2, controlled ([place]) 09/19/2014  . History of fall 08/18/2014  . Encounter for Medicare annual wellness exam 06/15/2014  . Dementia arising in the senium and presenium 06/15/2014  . Anxiety disorder 06/15/2014  . Orthostatic hypotension 03/10/2014  . Nocturnal  hypoxemia 01/18/2014  . Chronic systolic CHF (congestive heart failure) ([place]) 10/20/2013  . Compression fracture of spine ([place]) 06/10/2013  . Back pain of thoracolumbar region 06/09/2013  . Mobility impaired 04/16/2013  . Frequent PVCs 03/28/2013  . COPD mixed type ([place]) 03/28/2013  . Lung nodule, L lower lobe 10/15/2012  . Lobar pneumonia due to unspecified organism 06/11/2012  . Nonspecific (abnormal) findings on radiological and other examination of gastrointestinal tract 03/24/2012  . Nonspecific (abnormal) findings on radiological and other examination of biliary tract 03/24/2012  . Fatigue 03/18/2012  . Jaundice 03/18/2012  . Loss of appetite 03/18/2012  . Anemia 11/29/2011  . TINNITUS 08/09/2010  . OVERACTIVE BLADDER 03/14/2010  . GERD 11/06/2009  . Vitamin D deficiency 10/31/2008  . DIVERTICULOSIS OF COLON 12/25/2007  . RESTLESS LEG SYNDROME 10/26/2007  . COLONIC POLYPS, HX OF 10/26/2007  . HYPERCHOLESTEROLEMIA 09/23/2007  . OSTEOARTHRITIS 09/23/2007  . Osteoporosis 09/23/2007   Past Medical History  Diagnosis Date  . Osteoporosis   . Hyperlipidemia   . RLS (restless legs syndrome)   . Vitamin D deficiency   . GERD (gastroesophageal reflux disease)   . Cerumen impaction     recurrent  . Unexplained weight loss   . Cough   . Constipation   .  Diarrhea   . Shingles   . Overactive bladder   . Cancer [facility])     pancreatic  . Arthritis     OA, in hands  . CHF (congestive heart failure) ([place])   . Pancreatic insufficiency ([place])   . Hiatal hernia    Past Surgical History  Procedure Laterality Date  . Cataract extraction      patient unsure of date procedure was done.  . Cataract extraction  11/08    2nd cataract  . Ercp  03/24/2012    Procedure: ENDOSCOPIC RETROGRADE CHOLANGIOPANCREATOGRAPHY (ERCP);  Surgeon: Milus Banister, MD;  Location: [facility];  Service: Endoscopy;  Laterality: N/A;  . Eus  04/02/2012    Procedure: ESOPHAGEAL ENDOSCOPIC ULTRASOUND (EUS)  RADIAL;  Surgeon: Milus Banister, MD;  Location: [facility];  Service: Endoscopy;  Laterality: N/A;  EUS with FNA  . Fine needle aspiration  04/02/2012    Procedure: FINE NEEDLE ASPIRATION (FNA) LINEAR;  Surgeon: Milus Banister, MD;  Location: [facility];  Service: Endoscopy;  Laterality: N/A;  . Endoscopic retrograde cholangiopancreatography (ercp) with propofol N/A 01/28/2013    Procedure: ENDOSCOPIC RETROGRADE CHOLANGIOPANCREATOGRAPHY (ERCP) WITH PROPOFOL;  Surgeon: Milus Banister, MD;  Location: [facility];  Service: Endoscopy;  Laterality: N/A;  . Cardiac catheterization  10/14    [facility];no stent   Social History  Substance Use Topics  . Smoking status: Former Smoker -- 0.25 packs/day for 20 years    Types: Cigarettes    Quit date: 03/23/1992  . Smokeless tobacco: Never Used     Comment: PT SMOKED 2 CIGARETTES A DAY   . Alcohol Use: No   Family History  Problem Relation Age of Onset  . Diabetes Sister   . Hypertension Sister   . Heart disease Brother   . Fibromyalgia Daughter   . Alcohol abuse Son   . Heart disease Brother   . Diabetes Brother   . Heart disease Mother   . Pneumonia Father   . Heart failure Father   . Cancer Other     niece had breast and bone cancer   Allergies  Allergen Reactions  . Lactose Intolerance (Gi)     Pt can drink skim milk  . Hydrocodone Rash    Patient unsure of reaction, but it may have been diarrhea.  . Penicillins Rash    On scalp   Current Outpatient Prescriptions on File Prior to Visit  Medication Sig Dispense Refill  . alendronate (FOSAMAX) 70 MG tablet Take 1 tablet (70 mg total) by mouth every 7 (seven) days. Take with a full glass of water on an empty stomach. 12 tablet 3  . ALPRAZolam (XANAX) 0.5 MG tablet Take 0.5 mg by mouth 3 (three) times daily as needed for anxiety.    Marland Kitchen aspirin EC 81 MG tablet Take 81 mg by mouth daily.    . Calcium Carbonate-Vitamin D (CALTRATE 600+D PO) Take 2 tablets by mouth daily.    .  carvedilol (COREG) 3.125 MG tablet TAKE 1 TABLET TWICE A DAY WITH A MEAL 180 tablet 3  . CREON 3000-9500 units CPEP TAKE 1 CAPSULE THREE TIMES A DAY 270 capsule 0  . donepezil (ARICEPT) 10 MG tablet Take 1 tablet (10 mg total) by mouth at bedtime. 90 tablet 2  . ergocalciferol (VITAMIN D2) 50000 UNITS capsule Take 50,000 Units by mouth once a week.    . furosemide (LASIX) 20 MG tablet TAKE 1 TABLET DAILY 90 tablet 3  . KLOR-CON M10  10 MEQ tablet TAKE 1 TABLET DAILY 90 tablet 3  . lisinopril (PRINIVIL,ZESTRIL) 2.5 MG tablet TAKE 1 TABLET DAILY 90 tablet 3  . loratadine (CLARITIN) 10 MG tablet Take 1 tablet (10 mg total) by mouth daily as needed for allergies. 90 tablet 3  . megestrol (MEGACE) 40 MG/ML suspension TAKE 10 ML BY MOUTH EVERY DAY 300 mL 5  . NEXIUM 40 MG capsule TAKE 1 CAPSULE DAILY BEFORE BREAKFAST 90 capsule 1  . NON FORMULARY Oxygen 2 liters at bedtimes.    Vladimir Faster Glycol-Propyl Glycol (SYSTANE) 0.4-0.3 % GEL Apply to eye. 1-2 drops in each eye BID prn    . polyethylene glycol powder (GLYCOLAX/MIRALAX) powder Take 17 g by mouth daily as needed. 527 g 3  . predniSONE (DELTASONE) 10 MG tablet TAKE AS INSTRUCTED BY YOUR PRESCRIBER 50 tablet 2  . predniSONE (DELTASONE) 5 MG tablet TAKE 1 TABLET (5 MG) EVERY OTHER DAY. ALTERNATE WITH 10MG  TABLET EVERY OTHER DAY 50 tablet 2  . sertraline (ZOLOFT) 25 MG tablet TAKE 1 TABLET DAILY, TAKE AT DINNER TIME 90 tablet 0  . sodium chloride (OCEAN) 0.65 % SOLN nasal spray Place 1 spray into both nostrils as needed for congestion.    . traMADol (ULTRAM) 50 MG tablet Take 1 tablet (50 mg total) by mouth 2 (two) times daily. 180 tablet 0   No current facility-administered medications on file prior to visit.    Review of Systems Review of Systems  Constitutional: Negative for fever, appetite change, fatigue and unexpected weight change.  Eyes: Negative for pain and visual disturbance.  Respiratory: Negative for cough and shortness of breath.     Cardiovascular: Negative for cp or palpitations    Gastrointestinal: Negative for nausea, diarrhea and constipation.  Genitourinary: Negative for urgency and frequency.  Skin: Negative for pallor or rash   Neurological: Negative for weakness, light-headedness, numbness and headaches.  Hematological: Negative for adenopathy. Does not bruise/bleed easily.  Psychiatric/Behavioral: Negative for dysphoric mood. The patient is occ nervous/anxious.  pos for short term memory loss/dementia        Objective:   Physical Exam  Constitutional: She appears well-developed and well-nourished. No distress.  Frail appearing elderly female   HENT:  Head: Normocephalic and atraumatic.  Right Ear: External ear normal.  Left Ear: External ear normal.  Nose: Nose normal.  Mouth/Throat: Oropharynx is clear and moist.  Eyes: Conjunctivae and EOM are normal. Pupils are equal, round, and reactive to light. Right eye exhibits no discharge. Left eye exhibits no discharge. No scleral icterus.  Neck: Normal range of motion. Neck supple. No JVD present. Carotid bruit is not present. No thyromegaly present.  Cardiovascular: Normal rate, regular rhythm and intact distal pulses.  Exam reveals no gallop and no friction rub.   S4 gallop appreciated  Diffuse varicose veins   Pulmonary/Chest: Effort normal and breath sounds normal. No respiratory distress. She has no wheezes. She has no rales.  Abdominal: Soft. Bowel sounds are normal. She exhibits no distension and no mass. There is no tenderness.  Musculoskeletal: She exhibits no edema or tenderness.  Sock line noted -no pitting edema  Limited rom of hips and knees  Walks steadily with wlaker    Lymphadenopathy:    She has no cervical adenopathy.  Neurological: She is alert. She has normal reflexes. No cranial nerve deficit. She exhibits normal muscle tone. Coordination normal.  Skin: Skin is warm and dry. No rash noted. No erythema. No pallor.  Superficial  ecchymosis with  thin skin on arms and hands   Old ecchymosis on R leg  Psychiatric: She has a normal mood and affect. Her speech is tangential. Cognition and memory are impaired. She exhibits abnormal recent memory.  Pleasant  Tangential speech          Assessment & Plan:   Problem List Items Addressed This Visit      Cardiovascular and Mediastinum   Chronic systolic CHF (congestive heart failure) (HCC)    Lisinopril and lasix and K have been held by [facility] due to hypotension  She will disc this at f/u with Dr Rockey Situ cardiology  No CHF symptoms currently        Respiratory   COPD mixed type ([place]) - Primary    Doing better on Breo  Sees pulmonary Did qualify for exertional 02-currently working on that       Relevant Medications   Fluticasone Furoate-Vilanterol (BREO ELLIPTA IN)     Endocrine   Diabetes type 2, controlled ([place])    Lab Results  Component Value Date   HGBA1C 6.6* 10/30/2015   This is fairly stable  At her age-would like to eat what she wants to  On chronic prednisone  opthy is utd        Nervous and Auditory   Dementia arising in the senium and presenium    Family thinks this is very slowly progressing Doing well with ariept-tolerates it well  No accidents  Will watch for any safety issues - and pt/family aware memory care in [place] is avail when it is time  Will continue to follow         Musculoskeletal and Integument   Osteoporosis    On chronic prednisone/post menopausal  Tolerates fosamax now  On ca and D -will inc D by 2000 iu daily /order written Rev dexa 1/17 Will continue to follow       Compression fracture of spine ([place])    Does not c/o of much pain at this time  Fairly mobile with walker-fall prev is practiced at [facility] On fosamax for OP         Other   Loss of appetite    Megace is helpful  Was able to regain weight and now fairly stable (went up and then down)  She states appetite is good and she  attends social meals  Will continue to follow       HYPERCHOLESTEROLEMIA    This is in good control with HDL of 58 and LDL of 93 latest check  Is on a low sat fat diet        Other Visit Diagnoses    History of UTI        Relevant Orders    POCT Urinalysis Dipstick (Automated) (Completed)

## 2016-05-02 ENCOUNTER — Telehealth: Payer: Self-pay | Admitting: Internal Medicine

## 2016-05-02 MED ORDER — FLUTICASONE FUROATE-VILANTEROL 100-25 MCG/INH IN AEPB: 1.0000 | INHALATION_SPRAY | Freq: Every day | RESPIRATORY_TRACT | Status: DC

## 2016-05-02 NOTE — Assessment & Plan Note (Signed)
Lisinopril and lasix and K have been held by [facility] due to hypotension  She will disc this at f/u with Dr Rockey Situ cardiology  No CHF symptoms currently

## 2016-05-02 NOTE — Assessment & Plan Note (Signed)
Doing better on Breo  Sees pulmonary Did qualify for exertional 02-currently working on that

## 2016-05-02 NOTE — Assessment & Plan Note (Signed)
On chronic prednisone/post menopausal  Tolerates fosamax now  On ca and D -will inc D by 2000 iu daily /order written Rev dexa 1/17 Will continue to follow

## 2016-05-02 NOTE — Telephone Encounter (Signed)
Called spoke with pt's daughter. She states that the pt was started on breo at her last ov with [facility] but a rx was never sent into the pharmacy. I explained to her that I would send it to the [facility] in [place]. She voiced understanding and had no further questions. Rx sent. Nothing further needed.

## 2016-05-02 NOTE — Assessment & Plan Note (Signed)
Lab Results  Component Value Date   HGBA1C 6.6* 10/30/2015   This is fairly stable  At her age-would like to eat what she wants to  On chronic prednisone  opthy is utd

## 2016-05-02 NOTE — Assessment & Plan Note (Signed)
Family thinks this is very slowly progressing Doing well with ariept-tolerates it well  No accidents  Will watch for any safety issues - and pt/family aware memory care in [place] is avail when it is time  Will continue to follow

## 2016-05-02 NOTE — Assessment & Plan Note (Signed)
Does not c/o of much pain at this time  Fairly mobile with walker-fall prev is practiced at [facility] On fosamax for OP

## 2016-05-02 NOTE — Assessment & Plan Note (Signed)
This is in good control with HDL of 58 and LDL of 93 latest check  Is on a low sat fat diet

## 2016-05-02 NOTE — Assessment & Plan Note (Signed)
Megace is helpful  Was able to regain weight and now fairly stable (went up and then down)  She states appetite is good and she attends social meals  Will continue to follow

## 2016-05-03 ENCOUNTER — Telehealth: Payer: Self-pay | Admitting: Internal Medicine

## 2016-05-03 MED ORDER — FLUTICASONE-SALMETEROL 100-50 MCG/DOSE IN AEPB: 1.0000 | INHALATION_SPRAY | Freq: Two times a day (BID) | RESPIRATORY_TRACT | Status: DC

## 2016-05-03 NOTE — Telephone Encounter (Signed)
Spoke with pt's daughter. She is aware of the medication change. Rx has been sent in. Nothing further was needed.

## 2016-05-03 NOTE — Telephone Encounter (Signed)
Ok to offer Advair 100 disk    Inhale 1 puff, then rinse mouth, twice daily    Ref x 12

## 2016-05-03 NOTE — Telephone Encounter (Signed)
Spoke with pt's daughter. States that her insurance will not cover Breo. She must try and fail Advair first.  CY - please advise. Thanks.

## 2016-05-05 ENCOUNTER — Other Ambulatory Visit: Payer: Self-pay | Admitting: Family Medicine

## 2016-05-07 ENCOUNTER — Telehealth: Payer: Self-pay | Admitting: Internal Medicine

## 2016-05-07 NOTE — Telephone Encounter (Signed)
I have written on Rx pad order and had CY sign-this has been faxed back to number given.  Sent form to scan in EPIC.

## 2016-05-08 ENCOUNTER — Other Ambulatory Visit: Payer: Self-pay | Admitting: Family Medicine

## 2016-05-09 NOTE — Telephone Encounter (Signed)
Orders fax to [facility]

## 2016-05-16 ENCOUNTER — Telehealth: Payer: Self-pay | Admitting: Internal Medicine

## 2016-05-16 NOTE — Telephone Encounter (Signed)
Spoke with pt's daughter, states that she has been using Advair since 05/07/16 d/t insurance coverage, but it is not managing her symptoms as well.  Pt would like to switch back to [facility]  Pt uses [facility] on [address] in [place].    CY please advise on recs.  Thanks!   Allergies  Allergen Reactions  . Lactose Intolerance ([place])     Pt can drink skim milk  . Hydrocodone Rash    Patient unsure of reaction, but it may have been diarrhea.  . Penicillins Rash    On scalp   Current Outpatient Prescriptions on File Prior to Visit  Medication Sig Dispense Refill  . alendronate (FOSAMAX) 70 MG tablet Take 1 tablet (70 mg total) by mouth every 7 (seven) days. Take with a full glass of water on an empty stomach. 12 tablet 3  . ALPRAZolam (XANAX) 0.5 MG tablet Take 0.5 mg by mouth 3 (three) times daily as needed for anxiety.    Marland Kitchen aspirin EC 81 MG tablet Take 81 mg by mouth daily.    . Calcium Carbonate-Vitamin D (CALTRATE 600+D PO) Take 2 tablets by mouth daily.    . carvedilol (COREG) 3.125 MG tablet TAKE 1 TABLET TWICE A DAY WITH A MEAL 180 tablet 3  . CREON 3000-9500 units CPEP TAKE 1 CAPSULE THREE TIMES A DAY 270 capsule 0  . donepezil (ARICEPT) 10 MG tablet TAKE 1 TABLET AT BEDTIME 90 tablet 1  . ergocalciferol (VITAMIN D2) 50000 UNITS capsule Take 50,000 Units by mouth once a week.    . Fluticasone Furoate-Vilanterol (BREO ELLIPTA IN) Inhale 1 puff into the lungs every morning.    . fluticasone furoate-vilanterol (BREO ELLIPTA) 100-25 MCG/INH AEPB Inhale 1 puff into the lungs daily. 1 each 5  . Fluticasone-Salmeterol (ADVAIR DISKUS) 100-50 MCG/DOSE AEPB Inhale 1 puff into the lungs 2 (two) times daily. 60 each 12  . furosemide (LASIX) 20 MG tablet TAKE 1 TABLET DAILY 90 tablet 3  . KLOR-CON M10 10 MEQ tablet TAKE 1 TABLET DAILY 90 tablet 3  . loratadine (CLARITIN) 10 MG tablet Take 1 tablet (10 mg total) by mouth daily as needed for allergies. 90 tablet 3  . megestrol (MEGACE) 40 MG/ML  suspension TAKE 10 ML BY MOUTH EVERY DAY 300 mL 5  . NEXIUM 40 MG capsule TAKE 1 CAPSULE DAILY BEFORE BREAKFAST 90 capsule 2  . NON FORMULARY Oxygen 2 liters at bedtimes.    Vladimir Faster Glycol-Propyl Glycol (SYSTANE) 0.4-0.3 % GEL Apply to eye. 1-2 drops in each eye BID prn    . polyethylene glycol powder (GLYCOLAX/MIRALAX) powder Take 17 g by mouth daily as needed. 527 g 3  . predniSONE (DELTASONE) 10 MG tablet TAKE AS INSTRUCTED BY YOUR PRESCRIBER 50 tablet 2  . predniSONE (DELTASONE) 5 MG tablet TAKE 1 TABLET (5 MG) EVERY OTHER DAY. ALTERNATE WITH 10MG  TABLET EVERY OTHER DAY 50 tablet 2  . sertraline (ZOLOFT) 25 MG tablet TAKE 1 TABLET DAILY, TAKE AT DINNER TIME 90 tablet 2  . sodium chloride (OCEAN) 0.65 % SOLN nasal spray Place 1 spray into both nostrils as needed for congestion.    . traMADol (ULTRAM) 50 MG tablet Take 1 tablet (50 mg total) by mouth 2 (two) times daily. 180 tablet 0   No current facility-administered medications on file prior to visit.

## 2016-05-17 ENCOUNTER — Encounter: Payer: Self-pay | Admitting: Emergency Medicine

## 2016-05-17 ENCOUNTER — Emergency Department: Payer: Medicare Other

## 2016-05-17 ENCOUNTER — Emergency Department
Admission: EM | Admit: 2016-05-17 | Discharge: 2016-05-17 | Disposition: A | Discharge status: Home / Self Care | Payer: Medicare Other | Attending: Emergency Medicine | Admitting: Emergency Medicine

## 2016-05-17 DIAGNOSIS — J9811 Atelectasis: Secondary | ICD-10-CM | POA: Diagnosis not present

## 2016-05-17 DIAGNOSIS — Z79899 Other long term (current) drug therapy: Secondary | ICD-10-CM | POA: Insufficient documentation

## 2016-05-17 DIAGNOSIS — Z8507 Personal history of malignant neoplasm of pancreas: Secondary | ICD-10-CM | POA: Insufficient documentation

## 2016-05-17 DIAGNOSIS — E119 Type 2 diabetes mellitus without complications: Secondary | ICD-10-CM | POA: Insufficient documentation

## 2016-05-17 DIAGNOSIS — M810 Age-related osteoporosis without current pathological fracture: Secondary | ICD-10-CM | POA: Insufficient documentation

## 2016-05-17 DIAGNOSIS — E785 Hyperlipidemia, unspecified: Secondary | ICD-10-CM | POA: Diagnosis not present

## 2016-05-17 DIAGNOSIS — M25571 Pain in right ankle and joints of right foot: Secondary | ICD-10-CM | POA: Insufficient documentation

## 2016-05-17 DIAGNOSIS — Z7982 Long term (current) use of aspirin: Secondary | ICD-10-CM | POA: Diagnosis not present

## 2016-05-17 DIAGNOSIS — M1990 Unspecified osteoarthritis, unspecified site: Secondary | ICD-10-CM | POA: Diagnosis not present

## 2016-05-17 DIAGNOSIS — J449 Chronic obstructive pulmonary disease, unspecified: Secondary | ICD-10-CM | POA: Diagnosis not present

## 2016-05-17 DIAGNOSIS — K449 Diaphragmatic hernia without obstruction or gangrene: Secondary | ICD-10-CM | POA: Diagnosis not present

## 2016-05-17 DIAGNOSIS — M7989 Other specified soft tissue disorders: Secondary | ICD-10-CM | POA: Diagnosis not present

## 2016-05-17 DIAGNOSIS — Z87891 Personal history of nicotine dependence: Secondary | ICD-10-CM | POA: Insufficient documentation

## 2016-05-17 DIAGNOSIS — Z7951 Long term (current) use of inhaled steroids: Secondary | ICD-10-CM | POA: Diagnosis not present

## 2016-05-17 DIAGNOSIS — I5022 Chronic systolic (congestive) heart failure: Secondary | ICD-10-CM | POA: Diagnosis not present

## 2016-05-17 DIAGNOSIS — R0602 Shortness of breath: Secondary | ICD-10-CM | POA: Diagnosis not present

## 2016-05-17 DIAGNOSIS — Z7952 Long term (current) use of systemic steroids: Secondary | ICD-10-CM | POA: Insufficient documentation

## 2016-05-17 LAB — CBC WITH DIFFERENTIAL/PLATELET
BASOS PCT: 0 %
Basophils Absolute: 0 10*3/uL (ref 0–0.1)
EOS ABS: 0 10*3/uL (ref 0–0.7)
Eosinophils Relative: 0 %
HEMATOCRIT: 39.4 % (ref 35.0–47.0)
Hemoglobin: 13.1 g/dL (ref 12.0–16.0)
Lymphocytes Relative: 14 %
Lymphs Abs: 1 10*3/uL (ref 1.0–3.6)
MCH: 29 pg (ref 26.0–34.0)
MCHC: 33.3 g/dL (ref 32.0–36.0)
MCV: 87.1 fL (ref 80.0–100.0)
MONO ABS: 0.5 10*3/uL (ref 0.2–0.9)
MONOS PCT: 6 %
NEUTROS ABS: 5.6 10*3/uL (ref 1.4–6.5)
Neutrophils Relative %: 80 %
Platelets: 195 10*3/uL (ref 150–440)
RBC: 4.53 MIL/uL (ref 3.80–5.20)
RDW: 20.6 % — AB (ref 11.5–14.5)
WBC: 7.1 10*3/uL (ref 3.6–11.0)

## 2016-05-17 LAB — PROTIME-INR
INR: 1.09
PROTHROMBIN TIME: 14.3 s (ref 11.4–15.0)

## 2016-05-17 LAB — COMPREHENSIVE METABOLIC PANEL
ALBUMIN: 3.8 g/dL (ref 3.5–5.0)
ALK PHOS: 31 U/L — AB (ref 38–126)
ALT: 19 U/L (ref 14–54)
AST: 35 U/L (ref 15–41)
Anion gap: 9 (ref 5–15)
BUN: 28 mg/dL — AB (ref 6–20)
CALCIUM: 8.5 mg/dL — AB (ref 8.9–10.3)
CO2: 25 mmol/L (ref 22–32)
CREATININE: 0.86 mg/dL (ref 0.44–1.00)
Chloride: 108 mmol/L (ref 101–111)
GFR calc Af Amer: >60 mL/min (ref >60)
GFR calc non Af Amer: 59 mL/min — ABNORMAL LOW (ref >60)
GLUCOSE: 136 mg/dL — AB (ref 65–99)
Potassium: 4.3 mmol/L (ref 3.5–5.1)
SODIUM: 142 mmol/L (ref 135–145)
Total Bilirubin: 0.6 mg/dL (ref 0.3–1.2)
Total Protein: 7 g/dL (ref 6.5–8.1)

## 2016-05-17 LAB — TROPONIN I
Troponin I: 0.04 ng/mL — ABNORMAL HIGH (ref <0.031)
Troponin I: <0.03 ng/mL (ref <0.031)

## 2016-05-17 LAB — APTT: aPTT: 24 seconds (ref 24–36)

## 2016-05-17 MED ORDER — FLUTICASONE-SALMETEROL 115-21 MCG/ACT IN AERO: 2.0000 | INHALATION_SPRAY | Freq: Two times a day (BID) | RESPIRATORY_TRACT | Status: DC

## 2016-05-17 NOTE — ED Provider Notes (Signed)
[facility] Emergency Department Provider Note   ____________________________________________  Time seen: Approximately 2:26 PM  I have reviewed the triage vital signs and the nursing notes.   HISTORY  Chief Complaint Shortness of Breath    HPI Bridget Walker is a 82 y.o. female who says she feels fine and is not sure why she is here. Nurse was told that she was sent in to evaluate her feet which are blue. Her feet are actually bruised. Patient reports pain in the lateral part of the right ankle. Patient does not have a fever is not short of breath has no other complaints no other pain anywhere else.   Past Medical History  Diagnosis Date  . Osteoporosis   . Hyperlipidemia   . RLS (restless legs syndrome)   . Vitamin D deficiency   . GERD (gastroesophageal reflux disease)   . Cerumen impaction     recurrent  . Unexplained weight loss   . Cough   . Constipation   . Diarrhea   . Shingles   . Overactive bladder   . Cancer [facility])     pancreatic  . Arthritis     OA, in hands  . CHF (congestive heart failure) ([place])   . Pancreatic insufficiency ([place])   . Hiatal hernia     Patient Active Problem List   Diagnosis Date Noted  . Estrogen deficiency 10/30/2015  . Mental status change 02/21/2015  . Contusion of arm, left 02/21/2015  . Elevated transaminase level 10/24/2014  . Diabetes type 2, controlled ([place]) 09/19/2014  . History of fall 08/18/2014  . Encounter for Medicare annual wellness exam 06/15/2014  . Dementia arising in the senium and presenium 06/15/2014  . Anxiety disorder 06/15/2014  . Orthostatic hypotension 03/10/2014  . Nocturnal hypoxemia 01/18/2014  . Chronic systolic CHF (congestive heart failure) ([place]) 10/20/2013  . Compression fracture of spine ([place]) 06/10/2013  . Back pain of thoracolumbar region 06/09/2013  . Mobility impaired 04/16/2013  . Frequent PVCs 03/28/2013  . COPD mixed type ([place]) 03/28/2013  . Lung nodule, L  lower lobe 10/15/2012  . Nonspecific (abnormal) findings on radiological and other examination of gastrointestinal tract 03/24/2012  . Nonspecific (abnormal) findings on radiological and other examination of biliary tract 03/24/2012  . Fatigue 03/18/2012  . Jaundice 03/18/2012  . Loss of appetite 03/18/2012  . Anemia 11/29/2011  . TINNITUS 08/09/2010  . OVERACTIVE BLADDER 03/14/2010  . GERD 11/06/2009  . Vitamin D deficiency 10/31/2008  . DIVERTICULOSIS OF COLON 12/25/2007  . RESTLESS LEG SYNDROME 10/26/2007  . COLONIC POLYPS, HX OF 10/26/2007  . HYPERCHOLESTEROLEMIA 09/23/2007  . OSTEOARTHRITIS 09/23/2007  . Osteoporosis 09/23/2007    Past Surgical History  Procedure Laterality Date  . Cataract extraction      patient unsure of date procedure was done.  . Cataract extraction  11/08    2nd cataract  . Ercp  03/24/2012    Procedure: ENDOSCOPIC RETROGRADE CHOLANGIOPANCREATOGRAPHY (ERCP);  Surgeon: Milus Banister, MD;  Location: [facility];  Service: Endoscopy;  Laterality: N/A;  . Eus  04/02/2012    Procedure: ESOPHAGEAL ENDOSCOPIC ULTRASOUND (EUS) RADIAL;  Surgeon: Milus Banister, MD;  Location: [facility];  Service: Endoscopy;  Laterality: N/A;  EUS with FNA  . Fine needle aspiration  04/02/2012    Procedure: FINE NEEDLE ASPIRATION (FNA) LINEAR;  Surgeon: Milus Banister, MD;  Location: [facility];  Service: Endoscopy;  Laterality: N/A;  . Endoscopic retrograde cholangiopancreatography (ercp) with propofol N/A 01/28/2013  Procedure: ENDOSCOPIC RETROGRADE CHOLANGIOPANCREATOGRAPHY (ERCP) WITH PROPOFOL;  Surgeon: Milus Banister, MD;  Location: [facility];  Service: Endoscopy;  Laterality: N/A;  . Cardiac catheterization  10/14    [facility];no stent    Current Outpatient Rx  Name  Route  Sig  Dispense  Refill  . alendronate (FOSAMAX) 70 MG tablet   Oral   Take 70 mg by mouth once a week. Pt takes on Sunday.   Take with a full glass of water on an empty stomach.         .  ALPRAZolam (XANAX) 0.25 MG tablet   Oral   Take 0.125-0.25 mg by mouth 3 (three) times daily as needed for anxiety.         Marland Kitchen aspirin EC 81 MG tablet   Oral   Take 81 mg by mouth daily.         . Calcium Carbonate-Vitamin D (CALTRATE 600+D) 600-400 MG-UNIT tablet   Oral   Take 2 tablets by mouth daily.         . carvedilol (COREG) 3.125 MG tablet   Oral   Take 3.125 mg by mouth 2 (two) times daily with a meal.         . Cholecalciferol (VITAMIN D3) 1000 units CHEW   Oral   Chew 2,000 Units by mouth daily.         Marland Kitchen donepezil (ARICEPT) 10 MG tablet   Oral   Take 10 mg by mouth at bedtime.         Marland Kitchen esomeprazole (NEXIUM) 40 MG capsule   Oral   Take 40 mg by mouth daily at 12 noon.         . Fluticasone-Salmeterol (ADVAIR DISKUS) 100-50 MCG/DOSE AEPB   Inhalation   Inhale 1 puff into the lungs 2 (two) times daily.   60 each   12   . furosemide (LASIX) 20 MG tablet   Oral   Take 20 mg by mouth every other day.         . loratadine (CLARITIN) 10 MG tablet   Oral   Take 1 tablet (10 mg total) by mouth daily as needed for allergies.   90 tablet   3   . megestrol (MEGACE) 40 MG/ML suspension   Oral   Take 400 mg by mouth daily.         . Olopatadine HCl (PAZEO) 0.7 % SOLN   Both Eyes   Place 1 drop into both eyes at bedtime.         . Pancrelipase, Lip-Prot-Amyl, (CREON) 3000-9500 units CPEP   Oral   Take 1 capsule by mouth 3 (three) times daily with meals.         Vladimir Faster Glycol-Propyl Glycol (SYSTANE) 0.4-0.3 % GEL ophthalmic gel   Both Eyes   Place 1-2 application into both eyes 4 (four) times daily as needed (for dry eyes).         . polyethylene glycol (MIRALAX / GLYCOLAX) packet   Oral   Take 17 g by mouth daily as needed for mild constipation.         . potassium chloride (K-DUR) 10 MEQ tablet   Oral   Take 10 mEq by mouth daily.         . predniSONE (DELTASONE) 10 MG tablet   Oral   Take 10 mg by mouth every other  day.         . predniSONE (DELTASONE) 5 MG tablet  Oral   Take 5 mg by mouth every other day.         . sertraline (ZOLOFT) 25 MG tablet   Oral   Take 25 mg by mouth daily with supper.         . sodium chloride (MURO 128) 5 % ophthalmic ointment   Both Eyes   Place 1 application into both eyes as needed for irritation (for pain/redness).         . sodium chloride (OCEAN) 0.65 % SOLN nasal spray   Each Nare   Place 1-2 sprays into both nostrils 2 (two) times daily as needed (for nosebleeds).          . traMADol (ULTRAM) 50 MG tablet   Oral   Take 50 mg by mouth 2 (two) times daily. Pt is able to take an additional tablet daily if needed for pain.           Allergies Lactose intolerance (gi); Hydrocodone; and Penicillins  Family History  Problem Relation Age of Onset  . Diabetes Sister   . Hypertension Sister   . Heart disease Brother   . Fibromyalgia Daughter   . Alcohol abuse Son   . Heart disease Brother   . Diabetes Brother   . Heart disease Mother   . Pneumonia Father   . Heart failure Father   . Cancer Other     niece had breast and bone cancer    Social History Social History  Substance Use Topics  . Smoking status: Former Smoker -- 0.25 packs/day for 20 years    Types: Cigarettes    Quit date: 03/23/1992  . Smokeless tobacco: Never Used     Comment: PT SMOKED 2 CIGARETTES A DAY   . Alcohol Use: No    Review of Systems Constitutional: No fever/chills Eyes: No visual changes. ENT: No sore throat. Cardiovascular: Denies chest pain. Respiratory: Denies shortness of breath. Gastrointestinal: No abdominal pain.  No nausea, no vomiting.  No diarrhea.  No constipation. Genitourinary: Negative for dysuria. Musculoskeletal: Negative for back pain. Skin: Negative for rash. Neurological: Negative for headaches, focal weakness or numbness.  10-point ROS otherwise negative.  ____________________________________________   PHYSICAL  EXAM:  VITAL SIGNS: ED Triage Vitals  Enc Vitals Group     BP --      Pulse --      Resp --      Temp --      Temp src --      SpO2 --      Weight --      Height --      Head Cir --      Peak Flow --      Pain Score 05/17/16 1424 0     Pain Loc --      Pain Edu? --      Excl. in [place]? --     Constitutional: Alert and oriented. Well appearing and in no acute distress. Eyes: Conjunctivae are normal. PERRL. EOMI. Head: Atraumatic. Nose: No congestion/rhinnorhea. Mouth/Throat: Mucous membranes are moist.  Oropharynx non-erythematous. Neck: No stridor.  No cervical spine tenderness to palpation. Cardiovascular: Normal rate, regular rhythm. Grossly normal heart sounds.  Good peripheral circulation. Respiratory: Normal respiratory effort.  No retractions. Lungs CTAB. Gastrointestinal: Soft and nontender. No distention. No abdominal bruits. No CVA tenderness. Musculoskeletal: Both legs have some edema. The left foot is blue but nontender this is bruising. The right foot is also blue there is a line at the  top of the socket where the blueness is less below that. She is tender to over the lateral distal fibula. Good capillary refill bilaterally. Neurologic:  Normal speech and language. No gross focal neurologic deficits are appreciated. No gait instability. Skin:  Skin is warm, dry and intact. No rash noted. Psychiatric: Mood and affect are normal. Speech and behavior are normal.  ____________________________________________   LABS (all labs ordered are listed, but only abnormal results are displayed)  Labs Reviewed  COMPREHENSIVE METABOLIC PANEL - Abnormal; Notable for the following:    Glucose, Bld 136 (*)    BUN 28 (*)    Calcium 8.5 (*)    Alkaline Phosphatase 31 (*)    GFR calc non Af Amer 59 (*)    All other components within normal limits  CBC WITH DIFFERENTIAL/PLATELET - Abnormal; Notable for the following:    RDW 20.6 (*)    All other components within normal limits   TROPONIN I - Abnormal; Notable for the following:    Troponin I 0.04 (*)    All other components within normal limits  PROTIME-INR  APTT  TROPONIN I   ____________________________________________  EKG EKG read and interpreted by me shows normal sinus rhythm rate of 82 normal axis and occasional PVCs no acute ST-T wave changes. ____________________________________________  RADIOLOGY  Ankle x-ray read by radiology as no fracture Chest x-ray read as only large hiatal hernia and possible small pleural effusions by radiology ____________________________________________   PROCEDURES    ____________________________________________   INITIAL IMPRESSION / ASSESSMENT AND PLAN / ED COURSE  Pertinent labs & imaging results that were available during my care of the patient were reviewed by me and considered in my medical decision making (see chart for details).  Second troponin is negative patient is doing well with discharge her with a splint instructions to use a walker. He can follow-up with orthopedics. ____________________________________________   FINAL CLINICAL IMPRESSION(S) / ED DIAGNOSES  Final diagnoses:  Ankle pain, right      NEW MEDICATIONS STARTED DURING THIS VISIT:  New Prescriptions   No medications on file     Note:  This document was prepared using Dragon voice recognition software and may include unintentional dictation errors.    Nena Polio, MD 05/17/16 [phone]

## 2016-05-17 NOTE — ED Notes (Signed)
Discussed with Dr Cinda Quest that per the [facility] Nurse we do not have air splints at this facility - Per Dr Cinda Quest to place regular splint on right ankle

## 2016-05-17 NOTE — ED Notes (Signed)
Lab called elevated troponin of 0.04 - reported to Dr Cinda Quest - He stated to repeat troponin in 2 hours

## 2016-05-17 NOTE — Discharge Instructions (Signed)
Ankle Pain Ankle pain is a common symptom. The bones, cartilage, tendons, and muscles of the ankle joint perform a lot of work each day. The ankle joint holds your body weight and allows you to move around. Ankle pain can occur on either side or back of 1 or both ankles. Ankle pain may be sharp and burning or dull and aching. There may be tenderness, stiffness, redness, or warmth around the ankle. The pain occurs more often when a person walks or puts pressure on the ankle. CAUSES  There are many reasons ankle pain can develop. It is important to work with your caregiver to identify the cause since many conditions can impact the bones, cartilage, muscles, and tendons. Causes for ankle pain include:  Injury, including a break (fracture), sprain, or strain often due to a fall, sports, or a high-impact activity.  Swelling (inflammation) of a tendon (tendonitis).  Achilles tendon rupture.  Ankle instability after repeated sprains and strains.  Poor foot alignment.  Pressure on a nerve (tarsal tunnel syndrome).  Arthritis in the ankle or the lining of the ankle.  Crystal formation in the ankle (gout or pseudogout). DIAGNOSIS  A diagnosis is based on your medical history, your symptoms, results of your physical exam, and results of diagnostic tests. Diagnostic tests may include X-ray exams or a computerized magnetic scan (magnetic resonance imaging, MRI). TREATMENT  Treatment will depend on the cause of your ankle pain and may include:  Keeping pressure off the ankle and limiting activities.  Using crutches or other walking support (a cane or brace).  Using rest, ice, compression, and elevation.  Participating in physical therapy or home exercises.  Wearing shoe inserts or special shoes.  Losing weight.  Taking medications to reduce pain or swelling or receiving an injection.  Undergoing surgery. HOME CARE INSTRUCTIONS   Only take over-the-counter or prescription medicines for  pain, discomfort, or fever as directed by your caregiver.  Put ice on the injured area.  Put ice in a plastic bag.  Place a towel between your skin and the bag.  Leave the ice on for 15-20 minutes at a time, 03-04 times a day.  Keep your leg raised (elevated) when possible to lessen swelling.  Avoid activities that cause ankle pain.  Follow specific exercises as directed by your caregiver.  Record how often you have ankle pain, the location of the pain, and what it feels like. This information may be helpful to you and your caregiver.  Ask your caregiver about returning to work or sports and whether you should drive.  Follow up with your caregiver for further examination, therapy, or testing as directed. SEEK MEDICAL CARE IF:   Pain or swelling continues or worsens beyond 1 week.  You have an oral temperature above 102 F (38.9 C).  You are feeling unwell or have chills.  You are having an increasingly difficult time with walking.  You have loss of sensation or other new symptoms.  You have questions or concerns. MAKE SURE YOU:   Understand these instructions.  Will watch your condition.  Will get help right away if you are not doing well or get worse.   This information is not intended to replace advice given to you by your health care provider. Make sure you discuss any questions you have with your health care provider.   Document Released: 05/15/2010 Document Revised: 02/17/2012 Document Reviewed: 06/27/2015 Elsevier Interactive Patient Education 2016 Elsevier Inc.   Please wear the splint. You can put  weight on your toes as needed. Use a walker to help you get around with the splint. Please return for any new or worsening symptoms. Please follow-up with Dr. Warren Lacy and C and orthopedic surgeon. Call him tomorrow to get an appointment in approximately a week. He will check on the ankle again. Use Tylenol as needed for pain.

## 2016-05-17 NOTE — ED Notes (Signed)
Patient brought in by [facility] from [facility]. Per EMS patient was c/o being more short of breath than usual, EMS also states that when they arrived patient had lace up shoes on, they took her shoes off and found that her feet were blue.

## 2016-05-17 NOTE — Telephone Encounter (Signed)
Spoke with patient's daughter-she is aware of change in medication per CY. I have sent Rx to local pharmacy on file(confirmed) and nothing more needed at this time.

## 2016-05-17 NOTE — Telephone Encounter (Signed)
Suggest she try ADVAIR HFA 115/21  instead of the diskus. This would be covered better by her insurance than Memory [employer] is. If she doesn't like it then she can let us know to refill Breo. This Advair product works more like her Breo inhaler  Advair HFA  115/ 21   # 1,   Inhale 21 puffs then rinse mouth, twice daily    Refill x 12

## 2016-05-20 ENCOUNTER — Telehealth: Payer: Self-pay | Admitting: Internal Medicine

## 2016-05-20 NOTE — Telephone Encounter (Signed)
Spoke with the [place] at [facility]  Since the [place] is new, she is needing documentation of the med change  She states phone note from when med was changed is acceptable  I have faxed her copy of note from 05/16/16  Nothing further needed

## 2016-05-22 ENCOUNTER — Ambulatory Visit: Payer: Self-pay | Admitting: Adult Health

## 2016-05-23 ENCOUNTER — Telehealth: Payer: Self-pay | Admitting: Internal Medicine

## 2016-05-23 NOTE — Telephone Encounter (Signed)
Spoke with pt's daughter, wanted to let CY know that pt has had 02 delivered today by [facility] for her to wear with exertion as well as qhs.  Pt's daughter is hoping that this helps lessen pt's SOB and anxiety about SOB.   Forwarding to CY as FYI.

## 2016-05-29 DIAGNOSIS — R410 Disorientation, unspecified: Secondary | ICD-10-CM | POA: Diagnosis not present

## 2016-05-29 DIAGNOSIS — I5020 Unspecified systolic (congestive) heart failure: Secondary | ICD-10-CM | POA: Diagnosis not present

## 2016-05-30 DIAGNOSIS — F39 Unspecified mood [affective] disorder: Secondary | ICD-10-CM

## 2016-05-30 DIAGNOSIS — F0150 Vascular dementia without behavioral disturbance: Secondary | ICD-10-CM

## 2016-05-30 DIAGNOSIS — J449 Chronic obstructive pulmonary disease, unspecified: Secondary | ICD-10-CM | POA: Diagnosis not present

## 2016-05-30 DIAGNOSIS — E119 Type 2 diabetes mellitus without complications: Secondary | ICD-10-CM | POA: Diagnosis not present

## 2016-05-30 DIAGNOSIS — I5022 Chronic systolic (congestive) heart failure: Secondary | ICD-10-CM | POA: Diagnosis not present

## 2016-05-30 DIAGNOSIS — E441 Mild protein-calorie malnutrition: Secondary | ICD-10-CM | POA: Diagnosis not present

## 2016-06-03 DIAGNOSIS — I5022 Chronic systolic (congestive) heart failure: Secondary | ICD-10-CM | POA: Diagnosis not present

## 2016-06-03 DIAGNOSIS — E119 Type 2 diabetes mellitus without complications: Secondary | ICD-10-CM | POA: Diagnosis not present

## 2016-06-13 DIAGNOSIS — F0150 Vascular dementia without behavioral disturbance: Secondary | ICD-10-CM

## 2016-06-13 DIAGNOSIS — F39 Unspecified mood [affective] disorder: Secondary | ICD-10-CM

## 2016-06-13 DIAGNOSIS — E119 Type 2 diabetes mellitus without complications: Secondary | ICD-10-CM | POA: Diagnosis not present

## 2016-06-13 DIAGNOSIS — J438 Other emphysema: Secondary | ICD-10-CM | POA: Diagnosis not present

## 2016-06-13 DIAGNOSIS — J9611 Chronic respiratory failure with hypoxia: Secondary | ICD-10-CM | POA: Diagnosis not present

## 2016-06-13 DIAGNOSIS — I5022 Chronic systolic (congestive) heart failure: Secondary | ICD-10-CM | POA: Diagnosis not present

## 2016-06-20 ENCOUNTER — Ambulatory Visit (INDEPENDENT_AMBULATORY_CARE_PROVIDER_SITE_OTHER): Payer: Medicare Other | Admitting: Adult Health

## 2016-06-20 ENCOUNTER — Encounter: Payer: Self-pay | Admitting: Adult Health

## 2016-06-20 VITALS — BP 120/78 | HR 95 | Temp 98.0°F | Ht 59.0 in | Wt 122.0 lb

## 2016-06-20 DIAGNOSIS — J449 Chronic obstructive pulmonary disease, unspecified: Secondary | ICD-10-CM | POA: Diagnosis not present

## 2016-06-20 NOTE — Patient Instructions (Addendum)
Change Adviar to BREO 100 1 puff daily, rinse after use.  Continue on Oxygen 2l/m .  Check on extra battery for portable concentrator.  follow up Dr. Annamaria Boots  In 6 months and As needed

## 2016-06-20 NOTE — Progress Notes (Signed)
Subjective:    Patient ID: Bridget Walker, female    DOB: [birth year], 82 y.o.   MRN: [id number]  HPI 82 yo former smoker with COPD , Large HH , hx of PNA    06/20/2016 Follow up : COPD  Pt returns for 2 month follow up . Seen last ov with COPD flare  Changed to BREO . Feels it really helped but insurance would not cover.  Has moved to skilled care at [facility], wants to see if covered now.  Accompanied by her daughter. Feels breathing is doing some better.  Using O2 at 2l/m 24/7. Has POC but battery does not last that long.  Denies chest pain, orthopnea, edema. Fever. Or n/v.   Past Medical History  Diagnosis Date  . Osteoporosis   . Hyperlipidemia   . RLS (restless legs syndrome)   . Vitamin D deficiency   . GERD (gastroesophageal reflux disease)   . Cerumen impaction     recurrent  . Unexplained weight loss   . Cough   . Constipation   . Diarrhea   . Shingles   . Overactive bladder   . Cancer [facility])     pancreatic  . Arthritis     OA, in hands  . CHF (congestive heart failure) ([place])   . Pancreatic insufficiency ([place])   . Hiatal hernia    Current Outpatient Prescriptions on File Prior to Visit  Medication Sig Dispense Refill  . alendronate (FOSAMAX) 70 MG tablet Take 70 mg by mouth once a week. Pt takes on Sunday.   Take with a full glass of water on an empty stomach.    . ALPRAZolam (XANAX) 0.25 MG tablet Take 0.125-0.25 mg by mouth 3 (three) times daily as needed for anxiety.    Marland Kitchen aspirin EC 81 MG tablet Take 81 mg by mouth daily.    . Calcium Carbonate-Vitamin D (CALTRATE 600+D) 600-400 MG-UNIT tablet Take 2 tablets by mouth daily.    . carvedilol (COREG) 3.125 MG tablet Take 3.125 mg by mouth 2 (two) times daily with a meal.    . Cholecalciferol (VITAMIN D3) 1000 units CHEW Chew 2,000 Units by mouth daily.    Marland Kitchen donepezil (ARICEPT) 10 MG tablet Take 10 mg by mouth at bedtime.    Marland Kitchen esomeprazole (NEXIUM) 40 MG capsule Take 40 mg by mouth daily at 12 noon.    .  Fluticasone-Salmeterol (ADVAIR DISKUS) 100-50 MCG/DOSE AEPB Inhale 1 puff into the lungs 2 (two) times daily. 60 each 12  . furosemide (LASIX) 20 MG tablet Take 20 mg by mouth every other day.    . loratadine (CLARITIN) 10 MG tablet Take 1 tablet (10 mg total) by mouth daily as needed for allergies. 90 tablet 3  . megestrol (MEGACE) 40 MG/ML suspension Take 400 mg by mouth daily.    . Olopatadine HCl (PAZEO) 0.7 % SOLN Place 1 drop into both eyes at bedtime.    . Pancrelipase, Lip-Prot-Amyl, (CREON) 3000-9500 units CPEP Take 1 capsule by mouth 3 (three) times daily with meals.    Vladimir Faster Glycol-Propyl Glycol (SYSTANE) 0.4-0.3 % GEL ophthalmic gel Place 1-2 application into both eyes 4 (four) times daily as needed (for dry eyes).    . polyethylene glycol (MIRALAX / GLYCOLAX) packet Take 17 g by mouth daily as needed for mild constipation.    . potassium chloride (K-DUR) 10 MEQ tablet Take 10 mEq by mouth daily.    . predniSONE (DELTASONE) 10 MG tablet Take 10 mg by  mouth every other day.    . predniSONE (DELTASONE) 5 MG tablet Take 5 mg by mouth every other day.    . sertraline (ZOLOFT) 25 MG tablet Take 25 mg by mouth daily with supper.    . sodium chloride (MURO 128) 5 % ophthalmic ointment Place 1 application into both eyes as needed for irritation (for pain/redness).    . sodium chloride (OCEAN) 0.65 % SOLN nasal spray Place 1-2 sprays into both nostrils 2 (two) times daily as needed (for nosebleeds).     . traMADol (ULTRAM) 50 MG tablet Take 50 mg by mouth 2 (two) times daily. Pt is able to take an additional tablet daily if needed for pain.     No current facility-administered medications on file prior to visit.      Review of Systems Constitutional:   No  weight loss, night sweats,  Fevers, chills, fatigue, or  lassitude.  HEENT:   No headaches,  Difficulty swallowing,  Tooth/dental problems, or  Sore throat,                No sneezing, itching, ear ache, nasal congestion, post  nasal drip,   CV:  No chest pain,  Orthopnea, PND, swelling in lower extremities, anasarca, dizziness, palpitations, syncope.   GI  No heartburn, indigestion, abdominal pain, nausea, vomiting, diarrhea, change in bowel habits, loss of appetite, bloody stools.   Resp: No shortness of breath with exertion or at rest.  No excess mucus, no productive cough,  No non-productive cough,  No coughing up of blood.  No change in color of mucus.  No wheezing.  No chest wall deformity  Skin: no rash or lesions.  GU: no dysuria, change in color of urine, no urgency or frequency.  No flank pain, no hematuria   MS:  No joint pain or swelling.  No decreased range of motion.  No back pain.  Psych:  No change in mood or affect. No depression or anxiety.  No memory loss.         Objective:   Physical Exam Filed Vitals:   06/20/16 1515  BP: 120/78  Pulse: 95  Temp: 98 F (36.7 C)  TempSrc: Oral  Height: 4\' 11"  (1.499 m)  Weight: 122 lb (55.339 kg)  SpO2: 93%   GEN: A/Ox3; pleasant , NAD, elderly , on O2 , rolling walker.   HEENT:  [place]/AT,  EACs-clear, TMs-wnl, NOSE-clear, THROAT-clear, no lesions, no postnasal drip or exudate noted.   NECK:  Supple w/ fair ROM; no JVD; normal carotid impulses w/o bruits; no thyromegaly or nodules palpated; no lymphadenopathy.  RESP  Decreased BS in bases , .no accessory muscle use, no dullness to percussion  CARD:  RRR, no m/r/g  , no peripheral edema, pulses intact, no cyanosis or clubbing.  GI:   Soft & nt; nml bowel sounds; no organomegaly or masses detected.  Musco: Warm bil, no deformities or joint swelling noted.   Neuro: alert, no focal deficits noted.    Skin: Warm, no lesions or rashes  Tandrea Kommer NP-C  [facility]  06/20/2016

## 2016-06-20 NOTE — Assessment & Plan Note (Signed)
Recent flare now resolved  Try to see BREO will be covered.   Plan  Change Adviar to BREO 100 1 puff daily, rinse after use.  Continue on Oxygen 2l/m .  Check on extra batter for portable concentrator.  follow up Dr. Annamaria Boots  In 6 months and As needed

## 2016-06-26 ENCOUNTER — Encounter: Payer: Self-pay | Admitting: Cardiovascular Disease

## 2016-06-26 ENCOUNTER — Ambulatory Visit (INDEPENDENT_AMBULATORY_CARE_PROVIDER_SITE_OTHER): Payer: Medicare Other | Admitting: Cardiovascular Disease

## 2016-06-26 VITALS — BP 125/83 | HR 102 | Ht 60.0 in | Wt 120.2 lb

## 2016-06-26 DIAGNOSIS — J449 Chronic obstructive pulmonary disease, unspecified: Secondary | ICD-10-CM | POA: Diagnosis not present

## 2016-06-26 DIAGNOSIS — Z7409 Other reduced mobility: Secondary | ICD-10-CM

## 2016-06-26 DIAGNOSIS — I493 Ventricular premature depolarization: Secondary | ICD-10-CM | POA: Diagnosis not present

## 2016-06-26 DIAGNOSIS — I5022 Chronic systolic (congestive) heart failure: Secondary | ICD-10-CM

## 2016-06-26 NOTE — Patient Instructions (Signed)
Medication Instructions:   No new medication changes  Labwork:  No new labs  Testing/Procedures:  No new testing  Needed   Follow-Up: It was a pleasure seeing you in the office today. Please call us if you have new issues that need to be addressed before your next appt.  [phone]  Your physician wants you to follow-up in: 6 months.  You will receive a reminder letter in the mail two months in advance. If you don't receive a letter, please call our office to schedule the follow-up appointment.  If you need a refill on your cardiac medications before your next appointment, please call your pharmacy.

## 2016-06-26 NOTE — Progress Notes (Signed)
Patient ID: Bridget Walker, female   DOB: 03/10/1925, 82 y.o.   MRN: [id number] Cardiology Office Note  Date:  06/26/2016   ID:  Bridget Walker, DOB 01/21/1925, MRN [id number]  PCP:  Bridget Pardon, MD   Chief Complaint  Patient presents with  . other    6 month f/u c/o sob. Meds reviewed verbally with pt.    HPI:  Bridget Walker is an 82 year old woman with history of large hiatal hernia, underlying pulmonary disease/COPD on chronic prednisone, chronic pancreatitis who lives at [facility] [place], admission to the hospital 10/01/2013 with discharge 10/04/2013 with shortness of breath. Diagnosed with cardiomyopathy Cardiac catheterization 10/04/2013 showed no significant CAD Echocardiogram showed ejection fraction 30% in 2015 She presents for routine follow-up of her chronic systolic CHF  She presents with her daughter on today's visit She has recently moved up to skilled nursing for mobility issues, requiring full oxygen Mild ankle swelling, recent bruising to her right lower extremity Overall is happy with no new complaints Reports that breathing is stable Back on Lasix 20 mg daily Lab work June 2017 reviewed showing minimal elevation BUN, stable creatinine Weight is stable over the past 6 months, 120 pounds approximately in clinic  EKG on today's visit shows sinus tachycardia with rate 102 bpm, poor R-wave progression to the anterior precordial leads, left axis deviation  Other past medical history reviewed She was given diuretics with subsequent acute renal failure. creatinine increased from 1.15 up to 2.58. BUN increased from 28 to 47 . Diuretics were held, fluids initiated with improvement of her renal function back to her baseline . readmitted to the hospital 10/06/2013 for hypotension felt secondary to medications. Systolic pressure was 80. She was given fluids, Lasix was held temporarily. Hold parameters placed on her medications .  Weight has been dropping over the past several years, stable  recently [facility] with a walker, no recent falls, tries to go walking every day  Echocardiogram from the hospital 10/01/2013 shows ejection fraction 30-35%, mention of possible anterior wall hypokinesis. This was not noted on cardiac catheterization. Repeat echocardiogram 02/23/2014 shows ejection fraction 25-30%, mildly elevated right ventricular systolic pressures, mild aortic valve stenosi for elevated cardiac enzymes  PMH:   has a past medical history of Osteoporosis; Hyperlipidemia; RLS (restless legs syndrome); Vitamin D deficiency; GERD (gastroesophageal reflux disease); Cerumen impaction; Unexplained weight loss; Cough; Constipation; Diarrhea; Shingles; Overactive bladder; Cancer ([place]); Arthritis; CHF (congestive heart failure) ([place]); Pancreatic insufficiency ([place]); and Hiatal hernia.  PSH:    Past Surgical History  Procedure Laterality Date  . Cataract extraction      patient unsure of date procedure was done.  . Cataract extraction  11/08    2nd cataract  . Ercp  03/24/2012    Procedure: ENDOSCOPIC RETROGRADE CHOLANGIOPANCREATOGRAPHY (ERCP);  Surgeon: Milus Banister, MD;  Location: [facility];  Service: Endoscopy;  Laterality: N/A;  . Eus  04/02/2012    Procedure: ESOPHAGEAL ENDOSCOPIC ULTRASOUND (EUS) RADIAL;  Surgeon: Milus Banister, MD;  Location: [facility];  Service: Endoscopy;  Laterality: N/A;  EUS with FNA  . Fine needle aspiration  04/02/2012    Procedure: FINE NEEDLE ASPIRATION (FNA) LINEAR;  Surgeon: Milus Banister, MD;  Location: [facility];  Service: Endoscopy;  Laterality: N/A;  . Endoscopic retrograde cholangiopancreatography (ercp) with propofol N/A 01/28/2013    Procedure: ENDOSCOPIC RETROGRADE CHOLANGIOPANCREATOGRAPHY (ERCP) WITH PROPOFOL;  Surgeon: Milus Banister, MD;  Location: [facility];  Service: Endoscopy;  Laterality: N/A;  . Cardiac catheterization  10/14  [facility];no stent    Current Outpatient Prescriptions  Medication Sig Dispense Refill  .  alendronate (FOSAMAX) 70 MG tablet Take 70 mg by mouth once a week. Pt takes on Sunday.   Take with a full glass of water on an empty stomach.    . ALPRAZolam (XANAX) 0.25 MG tablet Take 0.125-0.25 mg by mouth 3 (three) times daily as needed for anxiety.    Marland Kitchen aspirin EC 81 MG tablet Take 81 mg by mouth daily.    . Calcium Carbonate-Vitamin D (CALTRATE 600+D) 600-400 MG-UNIT tablet Take 2 tablets by mouth daily.    . carvedilol (COREG) 3.125 MG tablet Take 3.125 mg by mouth 2 (two) times daily with a meal.    . Cholecalciferol (VITAMIN D3) 1000 units CHEW Chew 2,000 Units by mouth daily.    Marland Kitchen donepezil (ARICEPT) 10 MG tablet Take 10 mg by mouth at bedtime.    Marland Kitchen esomeprazole (NEXIUM) 40 MG capsule Take 40 mg by mouth daily at 12 noon.    . fluticasone furoate-vilanterol (BREO ELLIPTA) 100-25 MCG/INH AEPB Inhale 1 puff into the lungs daily.    . furosemide (LASIX) 20 MG tablet Take 20 mg by mouth daily.     Marland Kitchen loratadine (CLARITIN) 10 MG tablet Take 1 tablet (10 mg total) by mouth daily as needed for allergies. 90 tablet 3  . Olopatadine HCl (PAZEO) 0.7 % SOLN Place 1 drop into both eyes at bedtime.    . Pancrelipase, Lip-Prot-Amyl, (CREON) 3000-9500 units CPEP Take 1 capsule by mouth 3 (three) times daily with meals.    Vladimir Faster Glycol-Propyl Glycol (SYSTANE) 0.4-0.3 % GEL ophthalmic gel Place 1-2 application into both eyes 4 (four) times daily as needed (for dry eyes).    . polyethylene glycol (MIRALAX / GLYCOLAX) packet Take 17 g by mouth daily as needed for mild constipation.    . potassium chloride (K-DUR) 10 MEQ tablet Take 10 mEq by mouth daily.    . predniSONE (DELTASONE) 10 MG tablet Take 10 mg by mouth every other day.    . predniSONE (DELTASONE) 5 MG tablet Take 5 mg by mouth every other day.    . sertraline (ZOLOFT) 25 MG tablet Take 25 mg by mouth daily with supper.    . sodium chloride (MURO 128) 5 % ophthalmic ointment Place 1 application into both eyes as needed for irritation (for  pain/redness).    . sodium chloride (OCEAN) 0.65 % SOLN nasal spray Place 1-2 sprays into both nostrils 2 (two) times daily as needed (for nosebleeds).     . traMADol (ULTRAM) 50 MG tablet Take 50 mg by mouth 2 (two) times daily. Pt is able to take an additional tablet daily if needed for pain.     No current facility-administered medications for this visit.     Allergies:   Lactose intolerance (gi); Hydrocodone; and Penicillins   Social History:  The patient  reports that she quit smoking about 24 years ago. Her smoking use included Cigarettes. She has a 5 pack-year smoking history. She has never used smokeless tobacco. She reports that she does not drink alcohol or use illicit drugs.   Family History:   family history includes Alcohol abuse in her son; Cancer in her other; Diabetes in her brother and sister; Fibromyalgia in her daughter; Heart disease in her brother, brother, and mother; Heart failure in her father; Hypertension in her sister; Pneumonia in her father.    Review of Systems: Review of Systems  Respiratory: Positive for  shortness of breath.   Cardiovascular: Positive for leg swelling.  Gastrointestinal: Negative.   Musculoskeletal: Positive for falls.       Difficulty with ambulation, walks with a walker  Neurological: Positive for weakness.  Psychiatric/Behavioral: Negative.   All other systems reviewed and are negative.    PHYSICAL EXAM: VS:  BP 125/83 mmHg  Pulse 102  Ht 5' (1.524 m)  Wt 120 lb 4 oz (54.545 kg)  BMI 23.48 kg/m2 , BMI Body mass index is 23.48 kg/(m^2). GEN: Well nourished, well developed, in no acute distress, presenting with a walker, on oxygen HEENT: normal Neck: no JVD, carotid bruits, or masses Cardiac: RRR; no murmurs, rubs, or gallops, trace ankle edema bilaterally Respiratory:  Mildly decreased breath sounds throughout bilaterally, normal work of breathing GI: soft, nontender, nondistended, + BS MS: no deformity or atrophy Skin: warm  and dry, no rash Neuro:  Strength and sensation are intact Psych: euthymic mood, full affect    Recent Labs: 05/17/2016: ALT 19; BUN 28*; Creatinine, Ser 0.86; Hemoglobin 13.1; Platelets 195; Potassium 4.3; Sodium 142    Lipid Panel Lab Results  Component Value Date   CHOL 201* 10/30/2015   HDL 67.90 10/30/2015   LDLCALC 113* 10/30/2015   TRIG 103.0 10/30/2015      Wt Readings from Last 3 Encounters:  06/26/16 120 lb 4 oz (54.545 kg)  06/20/16 122 lb (55.339 kg)  05/01/16 117 lb 8 oz (53.298 kg)       ASSESSMENT AND PLAN:   Chronic systolic CHF (congestive heart failure) ([place]) - Plan: EKG 12-Lead Tolerating Lasix 20 mg daily Encouraged her to stay on her carvedilol Will need periodic BMP She reports she is not drinking very much, daily fluids reviewed with her If BUN and creatinine start to climb, may need Lasix every other day  COPD mixed type ([place]) Requiring oxygen around-the-clock, uses oxygen generator Stable  Mobility impaired Walks with a walker, increasing weakness, moved to skilled nursing Rare falls    Total encounter time more than 25 minutes  Greater than 50% was spent in counseling and coordination of care with the patient   Disposition:   F/U  6 months   Orders Placed This Encounter  Procedures  . EKG 12-Lead     Signed, Esmond Plants, M.D., Ph.D. 06/26/2016  [place], [place]

## 2016-07-19 DIAGNOSIS — K219 Gastro-esophageal reflux disease without esophagitis: Secondary | ICD-10-CM | POA: Diagnosis not present

## 2016-07-19 DIAGNOSIS — I5020 Unspecified systolic (congestive) heart failure: Secondary | ICD-10-CM | POA: Diagnosis not present

## 2016-07-19 DIAGNOSIS — E119 Type 2 diabetes mellitus without complications: Secondary | ICD-10-CM | POA: Diagnosis not present

## 2016-07-19 DIAGNOSIS — M810 Age-related osteoporosis without current pathological fracture: Secondary | ICD-10-CM | POA: Diagnosis not present

## 2016-07-19 DIAGNOSIS — K8681 Exocrine pancreatic insufficiency: Secondary | ICD-10-CM

## 2016-08-26 DIAGNOSIS — E119 Type 2 diabetes mellitus without complications: Secondary | ICD-10-CM | POA: Diagnosis not present

## 2016-08-26 DIAGNOSIS — F39 Unspecified mood [affective] disorder: Secondary | ICD-10-CM

## 2016-08-26 DIAGNOSIS — J9611 Chronic respiratory failure with hypoxia: Secondary | ICD-10-CM | POA: Diagnosis not present

## 2016-08-26 DIAGNOSIS — I5022 Chronic systolic (congestive) heart failure: Secondary | ICD-10-CM | POA: Diagnosis not present

## 2016-08-26 DIAGNOSIS — F0150 Vascular dementia without behavioral disturbance: Secondary | ICD-10-CM

## 2016-08-26 DIAGNOSIS — J438 Other emphysema: Secondary | ICD-10-CM | POA: Diagnosis not present

## 2016-09-10 DIAGNOSIS — Z23 Encounter for immunization: Secondary | ICD-10-CM | POA: Diagnosis not present

## 2016-09-11 DIAGNOSIS — R8299 Other abnormal findings in urine: Secondary | ICD-10-CM | POA: Diagnosis not present

## 2016-09-25 DIAGNOSIS — R3915 Urgency of urination: Secondary | ICD-10-CM

## 2016-09-25 DIAGNOSIS — N390 Urinary tract infection, site not specified: Secondary | ICD-10-CM | POA: Diagnosis not present

## 2016-09-25 DIAGNOSIS — R350 Frequency of micturition: Secondary | ICD-10-CM | POA: Diagnosis not present

## 2016-10-02 DIAGNOSIS — H1131 Conjunctival hemorrhage, right eye: Secondary | ICD-10-CM | POA: Diagnosis not present

## 2016-10-10 DIAGNOSIS — E119 Type 2 diabetes mellitus without complications: Secondary | ICD-10-CM | POA: Diagnosis not present

## 2016-10-10 DIAGNOSIS — I5020 Unspecified systolic (congestive) heart failure: Secondary | ICD-10-CM | POA: Diagnosis not present

## 2016-10-23 DIAGNOSIS — J449 Chronic obstructive pulmonary disease, unspecified: Secondary | ICD-10-CM | POA: Diagnosis not present

## 2016-10-23 DIAGNOSIS — K219 Gastro-esophageal reflux disease without esophagitis: Secondary | ICD-10-CM | POA: Diagnosis not present

## 2016-10-23 DIAGNOSIS — E119 Type 2 diabetes mellitus without complications: Secondary | ICD-10-CM | POA: Diagnosis not present

## 2016-10-23 DIAGNOSIS — I5020 Unspecified systolic (congestive) heart failure: Secondary | ICD-10-CM | POA: Diagnosis not present

## 2016-10-23 DIAGNOSIS — F0150 Vascular dementia without behavioral disturbance: Secondary | ICD-10-CM

## 2016-10-30 ENCOUNTER — Ambulatory Visit: Payer: Medicare Other | Admitting: Family Medicine

## 2016-11-05 ENCOUNTER — Ambulatory Visit: Payer: Medicare Other | Admitting: Family Medicine

## 2016-11-05 DIAGNOSIS — F22 Delusional disorders: Secondary | ICD-10-CM | POA: Diagnosis not present

## 2016-11-06 ENCOUNTER — Ambulatory Visit: Payer: Medicare Other | Admitting: Family Medicine

## 2016-11-08 ENCOUNTER — Ambulatory Visit: Payer: Medicare Other | Admitting: Family Medicine

## 2016-11-08 ENCOUNTER — Telehealth: Payer: Self-pay | Admitting: Family Medicine

## 2016-11-08 NOTE — Telephone Encounter (Signed)
I spoke to pt's daughter-pt was too agitated to come in today.  We discussed the difficulty of her situation ie: controlling agitation/ providing comfort without over sedating and she understands it is difficult  She will continue to watch her progress and reports the 2nd half of the day that Bridget Walker is more alert with an appetite so that is re assuring   Will cc to [name] so he is aware

## 2016-11-08 NOTE — Telephone Encounter (Signed)
Patient's daughter called and cancelled patient's appointment for today.  Patient wasn't feeling well enough to come to appointment.

## 2016-11-09 NOTE — Telephone Encounter (Signed)
Will continue to evaluate at [facility] and coordinate care with her daughter

## 2016-11-15 DIAGNOSIS — F489 Nonpsychotic mental disorder, unspecified: Secondary | ICD-10-CM | POA: Diagnosis not present

## 2016-11-18 DIAGNOSIS — F062 Psychotic disorder with delusions due to known physiological condition: Secondary | ICD-10-CM | POA: Diagnosis not present

## 2016-12-24 ENCOUNTER — Ambulatory Visit: Payer: Self-pay | Admitting: Internal Medicine

## 2016-12-26 ENCOUNTER — Ambulatory Visit: Payer: Self-pay | Admitting: Internal Medicine

## 2016-12-27 DIAGNOSIS — B351 Tinea unguium: Secondary | ICD-10-CM | POA: Diagnosis not present

## 2017-01-01 DIAGNOSIS — R451 Restlessness and agitation: Secondary | ICD-10-CM

## 2017-01-01 DIAGNOSIS — F29 Unspecified psychosis not due to a substance or known physiological condition: Secondary | ICD-10-CM

## 2017-01-03 ENCOUNTER — Ambulatory Visit: Payer: Self-pay | Admitting: Cardiovascular Disease

## 2017-01-03 ENCOUNTER — Telehealth: Payer: Self-pay | Admitting: Internal Medicine

## 2017-01-03 NOTE — Telephone Encounter (Signed)
Bridget Walker at [facility] called.  She's asking for Dr.Letvak to call her back about a hospice referral for patient.  She completed the referral and would like to speak to [facility] about the results.

## 2017-01-03 NOTE — Telephone Encounter (Signed)
She was evaluated by the daughter's request. She does not fit hospice criteria and I agree with this

## 2017-01-07 DIAGNOSIS — F39 Unspecified mood [affective] disorder: Secondary | ICD-10-CM

## 2017-01-07 DIAGNOSIS — J438 Other emphysema: Secondary | ICD-10-CM | POA: Diagnosis not present

## 2017-01-07 DIAGNOSIS — F0151 Vascular dementia with behavioral disturbance: Secondary | ICD-10-CM | POA: Diagnosis not present

## 2017-01-07 DIAGNOSIS — E119 Type 2 diabetes mellitus without complications: Secondary | ICD-10-CM | POA: Diagnosis not present

## 2017-01-07 DIAGNOSIS — J9611 Chronic respiratory failure with hypoxia: Secondary | ICD-10-CM

## 2017-01-07 DIAGNOSIS — I5022 Chronic systolic (congestive) heart failure: Secondary | ICD-10-CM | POA: Diagnosis not present

## 2017-01-08 ENCOUNTER — Telehealth: Payer: Self-pay

## 2017-01-08 NOTE — Telephone Encounter (Signed)
  [place]- let her know that her only option for attending physician at [facility] is [name] (since Dr Kary Kos is not taking new patients and Rollene Fare essentially works for/with [name]) Rollene Fare told me another option was to bring her to the office for regular visits every 60 days, and I am happy to do that but I know it is difficult to get her places  It sounds like by the criteria determined by hospice/paliative care that she does not qualify at this time- that is really determined by medicare

## 2017-01-08 NOTE — Telephone Encounter (Signed)
Bridget Walker pts daughter said pt is at [facility] and [name] is pts doctor but PCP is [name]. Bridget Walker said she has the right to chose another doctor due to disagreement in pts care;[name] said that pt is not doing well and Bridget Walker feels in her best interest to make a change.Bridget Walker said she would be willing to bring pt to [facility] or Bridget Walker would be OK with Avie Echevaria NP as provider of care. Bridget Walker said that [name] denied hospice care and palliative care. Bridget Walker said pt has several major health issues. Bridget Walker request cb.

## 2017-01-08 NOTE — Telephone Encounter (Signed)
Please let her know I will get back to her     I will route this to [facility]  From the chart it looks like she did not qualify for hospice or palliative care based on their criteria (not necessarily Dr Alla German decision) What are your thoughts?  Would you be comfortable taking her? Are there other providers at the nursing home since I do not go?  Thanks

## 2017-01-08 NOTE — Telephone Encounter (Signed)
I can be her PCP at [facility] but Dr. Silvio Pate would still be her attending at [facility], so I really don't think there is a point in switching her care to me . Dr. Kary Kos sees patient at [facility] but he is not taking on any new patients because he is trying to get away from the SNF. The only option if her daughter does not want Letvak as her PCP is to bring her to office to see you for every acute issue and for followups (which for SNF, she has to be assessed every 60 days). I can tell you I have dealt with her a lot, because of the daughters refusal to see Dr. Silvio Pate. She has a lot of behavioral issues going on with her dementia, and we have tried lots of different medications, which don't seem to be working. Based upon my last interaction from her, I did not think she was hospice appropriate either.

## 2017-01-09 NOTE — Telephone Encounter (Signed)
She is not hospice eligible now--unless she has continued weight loss. I spoke to her daughter--while she could get a palliative care consult--that is what I do and it is just a NP coming and discussing goals, etc The daughter doesn't seem to believe what we tell her about her mom and so it has been hard to deal with her psychosis and agitation.

## 2017-01-09 NOTE — Telephone Encounter (Signed)
Sorry to bother you about this, what is your take on the issue of palliative care or hospice?  From your note I got the impression that she would not qualify for these ? Thanks

## 2017-01-09 NOTE — Telephone Encounter (Addendum)
Left voicemail requesting pt's daughter to call the office back 

## 2017-01-09 NOTE — Telephone Encounter (Signed)
Spoke to daughter and she wanted to relay a message/concerns to Dr. Glori Bickers  She is okay with coming every 60 days if need be but she said with all pt's chronic medical issues, and her mental status declined and also pt lost 17lbs in a month or so the social worker there at [facility] is the one who recommended a Hospice eval When Hospice came and eval pt she was having a "good day" and was up and eating so they said she doesn't qualify for Hospice care but they recommended Palliative care. Daughter said that Dr. Silvio Pate denied their order for palliative care because he doesn't want the NP who run the palliative care dpt bothering his pt's. Daughter said that really bothered her when the Social Worker is saying pt needs hospice or palliative care and Hospice is saying she needs it too and it's Dr. Silvio Pate who doesn't think she does

## 2017-01-10 NOTE — Telephone Encounter (Signed)
Daughter notified Dr. Glori Bickers will call her directly some time today but it may be at the end of the day, daughter is okay with that

## 2017-01-10 NOTE — Telephone Encounter (Signed)
I spoke to pt and clarified everything re: not qualifying for hospice, etc.  She may in the future if she continues to loose weight.  They will keep things the way they are currently and may come in for a visit in the spring to see me   Thanks for everyone's help

## 2017-01-10 NOTE — Telephone Encounter (Signed)
Please let pt's daughter know I will call her when I get a chance (it may be the end of the day)  And send this back to me  Thanks

## 2017-01-19 ENCOUNTER — Encounter: Payer: Self-pay | Admitting: Emergency Medicine

## 2017-01-19 ENCOUNTER — Telehealth: Payer: Self-pay | Admitting: Family Medicine

## 2017-01-19 ENCOUNTER — Emergency Department
Admission: EM | Admit: 2017-01-19 | Discharge: 2017-01-19 | Disposition: A | Discharge status: Home / Self Care | Payer: Medicare Other | Attending: Emergency Medicine | Admitting: Emergency Medicine

## 2017-01-19 DIAGNOSIS — E119 Type 2 diabetes mellitus without complications: Secondary | ICD-10-CM | POA: Insufficient documentation

## 2017-01-19 DIAGNOSIS — R404 Transient alteration of awareness: Secondary | ICD-10-CM | POA: Diagnosis not present

## 2017-01-19 DIAGNOSIS — Z7982 Long term (current) use of aspirin: Secondary | ICD-10-CM | POA: Insufficient documentation

## 2017-01-19 DIAGNOSIS — Z8507 Personal history of malignant neoplasm of pancreas: Secondary | ICD-10-CM | POA: Insufficient documentation

## 2017-01-19 DIAGNOSIS — Z79899 Other long term (current) drug therapy: Secondary | ICD-10-CM | POA: Insufficient documentation

## 2017-01-19 DIAGNOSIS — I5022 Chronic systolic (congestive) heart failure: Secondary | ICD-10-CM | POA: Diagnosis not present

## 2017-01-19 DIAGNOSIS — R4182 Altered mental status, unspecified: Secondary | ICD-10-CM | POA: Diagnosis not present

## 2017-01-19 DIAGNOSIS — N390 Urinary tract infection, site not specified: Secondary | ICD-10-CM | POA: Diagnosis not present

## 2017-01-19 DIAGNOSIS — Z87891 Personal history of nicotine dependence: Secondary | ICD-10-CM | POA: Insufficient documentation

## 2017-01-19 DIAGNOSIS — J449 Chronic obstructive pulmonary disease, unspecified: Secondary | ICD-10-CM | POA: Diagnosis not present

## 2017-01-19 DIAGNOSIS — R451 Restlessness and agitation: Secondary | ICD-10-CM | POA: Diagnosis not present

## 2017-01-19 DIAGNOSIS — F99 Mental disorder, not otherwise specified: Secondary | ICD-10-CM | POA: Diagnosis not present

## 2017-01-19 DIAGNOSIS — Z7401 Bed confinement status: Secondary | ICD-10-CM | POA: Diagnosis not present

## 2017-01-19 LAB — CBC
HCT: 37.4 % (ref 35.0–47.0)
HEMOGLOBIN: 12.8 g/dL (ref 12.0–16.0)
MCH: 29.3 pg (ref 26.0–34.0)
MCHC: 34.1 g/dL (ref 32.0–36.0)
MCV: 85.9 fL (ref 80.0–100.0)
PLATELETS: 267 10*3/uL (ref 150–440)
RBC: 4.36 MIL/uL (ref 3.80–5.20)
RDW: 17.9 % — ABNORMAL HIGH (ref 11.5–14.5)
WBC: 8.7 10*3/uL (ref 3.6–11.0)

## 2017-01-19 LAB — COMPREHENSIVE METABOLIC PANEL
ALK PHOS: 55 U/L (ref 38–126)
ALT: 15 U/L (ref 14–54)
AST: 47 U/L — AB (ref 15–41)
Albumin: 4 g/dL (ref 3.5–5.0)
Anion gap: 11 (ref 5–15)
BUN: 24 mg/dL — AB (ref 6–20)
CALCIUM: 9.2 mg/dL (ref 8.9–10.3)
CHLORIDE: 104 mmol/L (ref 101–111)
CO2: 26 mmol/L (ref 22–32)
CREATININE: 1.1 mg/dL — AB (ref 0.44–1.00)
GFR, EST AFRICAN AMERICAN: 50 mL/min — AB (ref >60)
GFR, EST NON AFRICAN AMERICAN: 43 mL/min — AB (ref >60)
Glucose, Bld: 143 mg/dL — ABNORMAL HIGH (ref 65–99)
Potassium: 4.5 mmol/L (ref 3.5–5.1)
Sodium: 141 mmol/L (ref 135–145)
Total Bilirubin: 0.7 mg/dL (ref 0.3–1.2)
Total Protein: 7.2 g/dL (ref 6.5–8.1)

## 2017-01-19 LAB — URINALYSIS, ROUTINE W REFLEX MICROSCOPIC
BILIRUBIN URINE: NEGATIVE
Glucose, UA: NEGATIVE mg/dL
Hgb urine dipstick: NEGATIVE
Ketones, ur: NEGATIVE mg/dL
Nitrite: NEGATIVE
Protein, ur: NEGATIVE mg/dL
SPECIFIC GRAVITY, URINE: 1.011 (ref 1.005–1.030)
SQUAMOUS EPITHELIAL / LPF: NONE SEEN
pH: 5 (ref 5.0–8.0)

## 2017-01-19 LAB — TSH: TSH: 8.688 u[IU]/mL — AB (ref 0.350–4.500)

## 2017-01-19 LAB — ETHANOL

## 2017-01-19 NOTE — ED Notes (Signed)
Pt taken back to [facility] - claudine from [facility] already spoke with them. Pt calm and cooperative going back to nh. dtr already left.

## 2017-01-19 NOTE — Telephone Encounter (Signed)
Late entry. On-call note. Call from facility. Patient was acutely combative per report. Discussed with staff. The issue was not just that the patient was combative, but that she needed w/u for the acute change. I suggested that she have an evaluation today. That necessitated ER transport. Routed to PCP as FYI.

## 2017-01-19 NOTE — ED Notes (Signed)
Family at bedside. 

## 2017-01-19 NOTE — Progress Notes (Signed)
LCSW met with daughter of patient and we were required to leave the room. Patient was yelling at staff and daughter. Patient is a resident at the [facility] and is on the wait list for [facility] care. Family will return to [facility] LCSW provided them with other Memory care facilities ( private pay) and 1-1 support workers/private care providers resource list.  Patients daughter and her sons will look into speaking with [facility] to have her securely looked after. Daughter was thankful and LCSW was informed she would be discharging back to [facility].  Desia Saban LCSW

## 2017-01-19 NOTE — Discharge Instructions (Signed)
Please return immediately if condition worsens. Please contact her primary physician or the physician you were given for referral. If you have any specialist physicians involved in her treatment and plan please also contact them. Thank you for using [facility]. ° °

## 2017-01-19 NOTE — ED Notes (Addendum)
Pt combative and agitated with staff, unable to change patient into hospital appropriate clothes at this time.  Pt has oxygen in nose going at 3L via [place]. Pt has been kicking and swinging at staff.

## 2017-01-19 NOTE — ED Notes (Signed)
Called EMS for transport [phone]

## 2017-01-19 NOTE — ED Provider Notes (Signed)
-----------------------------------------   3:58 PM on 01/19/2017 -----------------------------------------   Blood pressure (!) 150/95, pulse (!) 107, resp. rate 20, height 5\' 2"  (1.575 m), weight 130 lb (59 kg), SpO2 95 %.  Assuming care from Dr. Kerman Passey.  In short, Bridget Walker is a 83 y.o. female with a chief complaint of Aggressive Behavior .  Refer to the original H&P for additional details.  The current plan of care is to *have the patient seen by social services. Patient apparently will be discharged back to the nursing facility until further follow-up can be established. We will continue to follow the results of the TSH which I suspect will be within normal limits.Daymon Larsen, MD 01/19/17 [phone]

## 2017-01-19 NOTE — Progress Notes (Signed)
Consulted with ED RN and this worker has already met with daughter and provided her with several resources. Her Mom is to return to [facility] once medically cleared.  LCSW reviewed several places ( memory care- private pay) could take her but she would need to make those arrangements with her family and facility of their choosing.  [employer] LCSW [phone]

## 2017-01-19 NOTE — ED Provider Notes (Addendum)
[facility] Emergency Department Provider Note  Time seen: 1:42 PM  I have reviewed the triage vital signs and the nursing notes.   HISTORY  Chief Complaint Aggressive Behavior    HPI DIANIRA RUDD is a 83 y.o. female with a past medical history of dementia, presents to the emergency department with agitation from her nursing facility today. According to EMS report the patient is normally agitated and will be combative at times with the nursing home staff however today for the last 3 hours the patient has been consistently agitated and screaming. They sent her here to the emergency department for evaluation. States the patient is acting like this in the past with urinary tract infections. Here the patient is agitated, screaming get me out of here. Does not answer questions or follow commands.  Past Medical History:  Diagnosis Date  . Arthritis    OA, in hands  . Cancer [facility])    pancreatic  . Cerumen impaction    recurrent  . CHF (congestive heart failure) ([place])   . Constipation   . Cough   . Diarrhea   . GERD (gastroesophageal reflux disease)   . Hiatal hernia   . Hyperlipidemia   . Osteoporosis   . Overactive bladder   . Pancreatic insufficiency   . RLS (restless legs syndrome)   . Shingles   . Unexplained weight loss   . Vitamin D deficiency     Patient Active Problem List   Diagnosis Date Noted  . Estrogen deficiency 10/30/2015  . Mental status change 02/21/2015  . Contusion of arm, left 02/21/2015  . Elevated transaminase level 10/24/2014  . Diabetes type 2, controlled ([place]) 09/19/2014  . History of fall 08/18/2014  . Encounter for Medicare annual wellness exam 06/15/2014  . Dementia arising in the senium and presenium 06/15/2014  . Anxiety disorder 06/15/2014  . Orthostatic hypotension 03/10/2014  . Nocturnal hypoxemia 01/18/2014  . Chronic systolic CHF (congestive heart failure) ([place]) 10/20/2013  . Compression fracture of spine ([place])  06/10/2013  . Back pain of thoracolumbar region 06/09/2013  . Mobility impaired 04/16/2013  . Frequent PVCs 03/28/2013  . COPD mixed type ([place]) 03/28/2013  . Lung nodule, L lower lobe 10/15/2012  . Nonspecific (abnormal) findings on radiological and other examination of gastrointestinal tract 03/24/2012  . Nonspecific (abnormal) findings on radiological and other examination of biliary tract 03/24/2012  . Fatigue 03/18/2012  . Jaundice 03/18/2012  . Loss of appetite 03/18/2012  . Anemia 11/29/2011  . TINNITUS 08/09/2010  . OVERACTIVE BLADDER 03/14/2010  . GERD 11/06/2009  . Vitamin D deficiency 10/31/2008  . DIVERTICULOSIS OF COLON 12/25/2007  . RESTLESS LEG SYNDROME 10/26/2007  . COLONIC POLYPS, HX OF 10/26/2007  . HYPERCHOLESTEROLEMIA 09/23/2007  . OSTEOARTHRITIS 09/23/2007  . Osteoporosis 09/23/2007    Past Surgical History:  Procedure Laterality Date  . CARDIAC CATHETERIZATION  10/14   [facility];no stent  . CATARACT EXTRACTION     patient unsure of date procedure was done.  Marland Kitchen CATARACT EXTRACTION  11/08   2nd cataract  . ENDOSCOPIC RETROGRADE CHOLANGIOPANCREATOGRAPHY (ERCP) WITH PROPOFOL N/A 01/28/2013   Procedure: ENDOSCOPIC RETROGRADE CHOLANGIOPANCREATOGRAPHY (ERCP) WITH PROPOFOL;  Surgeon: Milus Banister, MD;  Location: [facility] ENDOSCOPY;  Service: Endoscopy;  Laterality: N/A;  . ERCP  03/24/2012   Procedure: ENDOSCOPIC RETROGRADE CHOLANGIOPANCREATOGRAPHY (ERCP);  Surgeon: Milus Banister, MD;  Location: [facility];  Service: Endoscopy;  Laterality: N/A;  . EUS  04/02/2012   Procedure: ESOPHAGEAL ENDOSCOPIC ULTRASOUND (EUS) RADIAL;  Surgeon: Milus Banister, MD;  Location: [facility];  Service: Endoscopy;  Laterality: N/A;  EUS with FNA  . FINE NEEDLE ASPIRATION  04/02/2012   Procedure: FINE NEEDLE ASPIRATION (FNA) LINEAR;  Surgeon: Milus Banister, MD;  Location: [facility] ENDOSCOPY;  Service: Endoscopy;  Laterality: N/A;    Prior to Admission medications   Medication Sig Start Date  End Date Taking? Authorizing Provider  alendronate (FOSAMAX) 70 MG tablet Take 70 mg by mouth once a week. Pt takes on Sunday.   Take with a full glass of water on an empty stomach.    Historical Provider, MD  ALPRAZolam Duanne Moron) 0.25 MG tablet Take 0.125-0.25 mg by mouth 3 (three) times daily as needed for anxiety.    Historical Provider, MD  aspirin EC 81 MG tablet Take 81 mg by mouth daily.    Historical Provider, MD  Calcium Carbonate-Vitamin D (CALTRATE 600+D) 600-400 MG-UNIT tablet Take 2 tablets by mouth daily.    Historical Provider, MD  carvedilol (COREG) 3.125 MG tablet Take 3.125 mg by mouth 2 (two) times daily with a meal.    Historical Provider, MD  Cholecalciferol (VITAMIN D3) 1000 units CHEW Chew 2,000 Units by mouth daily.    Historical Provider, MD  donepezil (ARICEPT) 10 MG tablet Take 10 mg by mouth at bedtime.    Historical Provider, MD  esomeprazole (NEXIUM) 40 MG capsule Take 40 mg by mouth daily at 12 noon.    Historical Provider, MD  fluticasone furoate-vilanterol (BREO ELLIPTA) 100-25 MCG/INH AEPB Inhale 1 puff into the lungs daily.    Historical Provider, MD  furosemide (LASIX) 20 MG tablet Take 20 mg by mouth daily.     Historical Provider, MD  loratadine (CLARITIN) 10 MG tablet Take 1 tablet (10 mg total) by mouth daily as needed for allergies. 09/06/15   Abner Greenspan, MD  Olopatadine HCl (PAZEO) 0.7 % SOLN Place 1 drop into both eyes at bedtime.    Historical Provider, MD  Pancrelipase, Lip-Prot-Amyl, (CREON) 3000-9500 units CPEP Take 1 capsule by mouth 3 (three) times daily with meals.    Historical Provider, MD  Polyethyl Glycol-Propyl Glycol (SYSTANE) 0.4-0.3 % GEL ophthalmic gel Place 1-2 application into both eyes 4 (four) times daily as needed (for dry eyes).    Historical Provider, MD  polyethylene glycol (MIRALAX / GLYCOLAX) packet Take 17 g by mouth daily as needed for mild constipation.    Historical Provider, MD  potassium chloride (K-DUR) 10 MEQ tablet Take 10  mEq by mouth daily.    Historical Provider, MD  predniSONE (DELTASONE) 10 MG tablet Take 10 mg by mouth every other day.    Historical Provider, MD  predniSONE (DELTASONE) 5 MG tablet Take 5 mg by mouth every other day.    Historical Provider, MD  sertraline (ZOLOFT) 25 MG tablet Take 25 mg by mouth daily with supper.    Historical Provider, MD  sodium chloride (MURO 128) 5 % ophthalmic ointment Place 1 application into both eyes as needed for irritation (for pain/redness).    Historical Provider, MD  sodium chloride (OCEAN) 0.65 % SOLN nasal spray Place 1-2 sprays into both nostrils 2 (two) times daily as needed (for nosebleeds).     Historical Provider, MD  traMADol (ULTRAM) 50 MG tablet Take 50 mg by mouth 2 (two) times daily. Pt is able to take an additional tablet daily if needed for pain.    Historical Provider, MD    Allergies  Allergen Reactions  . Lactose  Intolerance (Gi) Other (See Comments)    Reaction:  GI upset   . Hydrocodone Diarrhea and Rash  . Penicillins Rash and Other (See Comments)    Has patient had a PCN reaction causing immediate rash, facial/tongue/throat swelling, SOB or lightheadedness with hypotension: No Has patient had a PCN reaction causing severe rash involving mucus membranes or skin necrosis: No Has patient had a PCN reaction that required hospitalization No Has patient had a PCN reaction occurring within the last 10 years: No If all of the above answers are "NO", then may proceed with Cephalosporin use.    Family History  Problem Relation Age of Onset  . Heart disease Mother   . Pneumonia Father   . Heart failure Father   . Diabetes Sister   . Hypertension Sister   . Heart disease Brother   . Fibromyalgia Daughter   . Alcohol abuse Son   . Heart disease Brother   . Diabetes Brother   . Cancer Other     niece had breast and bone cancer    Social History Social History  Substance Use Topics  . Smoking status: Former Smoker    Packs/day: 0.25     Years: 20.00    Types: Cigarettes    Quit date: 03/23/1992  . Smokeless tobacco: Never Used     Comment: PT SMOKED 2 CIGARETTES A DAY   . Alcohol use No    Review of Systems Unable to obtain an accurate review of systems due to dementia and significant agitation. ____________________________________________   PHYSICAL EXAM:  Constitutional: Alert, agitated at times, calm at other times. When asked to follow commands the patient states get me out of here but does not follow the commands. Does allow a physical examination. Eyes: Normal exam ENT   Head: Normocephalic and atraumatic.   Mouth/Throat: Mucous membranes are moist. Cardiovascular: Normal rate, regular rhythm. Respiratory: Normal respiratory effort without tachypnea nor retractions. Breath sounds are clear Gastrointestinal: Soft and nontender. No distention Musculoskeletal: Nontender with normal range of motion in all extremities.  Neurologic:  Speaks normally, yells at times, moves all extremities. No gross deficits. Skin:  Skin is warm, dry and intact.  Psychiatric: Agitated at times.  ____________________________________________    INITIAL IMPRESSION / ASSESSMENT AND PLAN / ED COURSE  Pertinent labs & imaging results that were available during my care of the patient were reviewed by me and considered in my medical decision making (see chart for details).  Patient presents to the emergency department for medical evaluation. Nursing home staff reports the patient is agitated at times at baseline and will become combative at times. They state today for the past 3 hours she has been extremely agitated, more so than normal. They state she has acted this way in the past with urinary tract infections. We will check labs and close monitor in the emergency department. The patient is agitated when asked to follow commands and answer questions. Yells get me out of here I just want to leave. However when left alone the  patient calms down. She allowed me to do a physical examination.  Patient's labs are largely at her baseline. Urinalysis shows 6-30 WBCs, rare bacteria, small amount of leukocytes.. A urine culture has been sent. We will treat with a one-time dose of fosfomycin.  Patient's daughter is now here to states over the past one week she has had a decline becoming more agitated and confused. However she states since November she has been losing weight, becoming agitated  at times and combative at times. Patient does have a mild UTI which we will treat with fosfomycin however I believe the patient's are most consistent with worsening dementia. The daughter states he is currently on the waiting list for the memory care unit at [facility] but she has been on the waiting list for over one year. Daughter also states they tried to get hospice to take the patient given her worsening confusion and agitation but was told that she is not a candidate for hospice when the hospice nurse evaluated her. Daughter is concerned that her current living situation is not ideal given her worsening confusion. We will have the social worker see the patient to see what options are available for them.  Patient care signed out to oncoming physician.  ____________________________________________   FINAL CLINICAL IMPRESSION(S) / ED DIAGNOSES  Agitation Urinary tract infection Confusion  Harvest Dark, MD 01/19/17 [place], MD 01/19/17 [id number]

## 2017-01-19 NOTE — ED Triage Notes (Signed)
Sent in by [facility] for aggressive behavior - hx of dementia with aggression that was worse today

## 2017-01-19 NOTE — ED Notes (Signed)
Family at bedside, informed them that patient has been combative and trying to get out of the bed and will need a sitter in there once the family leaves for safety.

## 2017-01-19 NOTE — ED Notes (Signed)
Pt arrived via EMS from [facility] with reports of violence towards staff for the past 2 hours.  Pt has hx of dementia and has been violent in the past, but only for a short period.  Pt recently started on Namenda about 2 weeks ago and staff and family feel patient is getting worse.  Pt continues to yell that she wants to go to [facility].

## 2017-01-20 ENCOUNTER — Telehealth: Payer: Self-pay

## 2017-01-20 ENCOUNTER — Telehealth: Payer: Self-pay | Admitting: Family Medicine

## 2017-01-20 NOTE — Telephone Encounter (Signed)
Patient was seen at the ER yesterday.  Benjamine Mola wanted to make you aware of patient's TSH results. She thinks the results came back after she left.

## 2017-01-20 NOTE — Telephone Encounter (Signed)
Spoke with [facility] and they will do the repeat TSH and free T4 there and fax [place] the results

## 2017-01-20 NOTE — Telephone Encounter (Signed)
Her TSH is high- which can indicate underactive thyroid function (hypothyroidism) Lab Results  Component Value Date   TSH 8.688 (H) 01/19/2017    We need to repeat this with a free T4 (diagnosis is elevated tsh) when able  Unsure if that could be done at [facility] or if she will have to come here  Based on those results -will see if she needs thyroid supplementation

## 2017-01-20 NOTE — Telephone Encounter (Signed)
Per chart review pt was seen [facility] ED. Please see phone notes from 01/19/17 and 01/20/17.

## 2017-01-20 NOTE — Telephone Encounter (Signed)
PLEASE NOTE: All timestamps contained within this report are represented as [place] Standard Time. CONFIDENTIALTY NOTICE: This fax transmission is intended only for the addressee. It contains information that is legally privileged, confidential or otherwise protected from use or disclosure. If you are not the intended recipient, you are strictly prohibited from reviewing, disclosing, copying using or disseminating any of this information or taking any action in reliance on or regarding this information. If you have received this fax in error, please notify us immediately by telephone so that we can arrange for its return to [place]. Phone: [phone], Toll-Free: [phone], Fax: [phone] Page: 1 of 1 Call Id: [id number] [place] Night - Client Nonclinical Telephone Record [place] Night - Client Client Site [place] Physician Glori Bickers, [place] Contact Type Call Who Is Calling Physician / Provider / Hospital Call Type Provider Call [facility] Now Reason for Call Request to speak to Physician Initial [place] with a pt that is combative and confused and needs to speak to the on call Additional Comment Patient Name Bridget Walker Patient DOB 25-Jul-1925 Requesting Provider [facility] Physician Number [phone] Facility Name [facility] Phone DateTime Result/Outcome Message Type Notes Renford Dills - MD [id number] 01/19/2017 11:53:21 AM Called On Call Provider - Reached Doctor Paged Renford Dills - MD 01/19/2017 11:53:25 AM Spoke with On Call - General Message Result Call Closed By: Dalia Heading Transaction Date/Time: 01/19/2017 11:29:23 AM (ET)

## 2017-01-23 DIAGNOSIS — E039 Hypothyroidism, unspecified: Secondary | ICD-10-CM | POA: Diagnosis not present

## 2017-01-24 ENCOUNTER — Telehealth: Payer: Self-pay

## 2017-01-24 NOTE — Telephone Encounter (Signed)
It is fine with me if ok with [name] -it sounds like she cannot get the patient in for visits to the office

## 2017-01-24 NOTE — Telephone Encounter (Signed)
[name] left v/m; [name] spoke with [place] and [name] about changing PCP to [name]; [name] understood after talking to [name] would leave [name] as PCP since pt was at [facility] pt PCP was changed. [name] request cb from [place].

## 2017-01-24 NOTE — Telephone Encounter (Signed)
See prev note I know there was discussion between Dr. Silvio Pate and Dr. Glori Bickers regarding this pt, message routed to both doctors  Dr. Silvio Pate is it okay to change you to the PCP?

## 2017-01-25 NOTE — Telephone Encounter (Signed)
Please call the daughter to confirm she wants me and Rollene Fare to resume visits on her mom at [facility].

## 2017-01-27 ENCOUNTER — Telehealth: Payer: Self-pay | Admitting: Family Medicine

## 2017-01-27 NOTE — Telephone Encounter (Signed)
Spoke to [name]. She said she has spoken to the Education officer, museum at [name]. She does not want to keep [name] as her PCP. She will bring her mother in to see [name] on regular intervals.  Needs to talk to [name] about [place] causing hallucinations and other issues. She would like to take her off of it if possible. Please call [name] back.

## 2017-01-27 NOTE — Telephone Encounter (Signed)
Unfortunately the daughter is in some denial--- the problems of hallucinations and agitation go back a long way and have not responded to benzos and antipsychotics. We have just started the namenda I will plan to call her tomorrow when I am at the facility

## 2017-01-27 NOTE — Telephone Encounter (Signed)
Ms. Bridget Walker called to speak with Dr. Glori Bickers or [place].  She did not indicated what she needed to discuss.  Can you please call her.

## 2017-01-27 NOTE — Telephone Encounter (Signed)
I spoke to [place] and do not think it is realistic that she will be able to get her to the office often enough (if at all ) for me to deliver the proper care she needs (she cannot get her into the office now)  Aside from that - she thinks Namenda is making her worse-and has noticed negative mental status changes since starting it  I recommend she discuss this with [name] and/or Rollene Fare when they round on her  Bridget Walker says pt is basically non responsive socially today   I feel that [name] should remain her primary provider (along with Rollene Fare) since they do see her often and it is very difficult to get her into the office  I will cc this to both of them   Bridget Walker is currently in agreement with this plan   Thanks

## 2017-01-27 NOTE — Telephone Encounter (Signed)
Please check in with her ,thanks

## 2017-01-27 NOTE — Telephone Encounter (Signed)
See previous TE

## 2017-01-27 NOTE — Telephone Encounter (Signed)
Bridget Walker called again.  She feel Lamenda has not helped and feels that she has had more haluccinations and is more withdrawn.  She would like to stop this medication. Just wanted you to know that she called again and her thoughts on the medications.

## 2017-01-27 NOTE — Telephone Encounter (Signed)
She has not worsened and she objects to everything! I will call her tomorrow but the interactions have not been very useful

## 2017-01-28 DIAGNOSIS — F062 Psychotic disorder with delusions due to known physiological condition: Secondary | ICD-10-CM | POA: Diagnosis not present

## 2017-02-07 DIAGNOSIS — M810 Age-related osteoporosis without current pathological fracture: Secondary | ICD-10-CM | POA: Diagnosis not present

## 2017-02-07 DIAGNOSIS — F0630 Mood disorder due to known physiological condition, unspecified: Secondary | ICD-10-CM | POA: Diagnosis not present

## 2017-02-07 DIAGNOSIS — J441 Chronic obstructive pulmonary disease with (acute) exacerbation: Secondary | ICD-10-CM | POA: Diagnosis not present

## 2017-02-07 DIAGNOSIS — K219 Gastro-esophageal reflux disease without esophagitis: Secondary | ICD-10-CM | POA: Diagnosis not present

## 2017-02-07 DIAGNOSIS — I5020 Unspecified systolic (congestive) heart failure: Secondary | ICD-10-CM | POA: Diagnosis not present

## 2017-02-07 DIAGNOSIS — F062 Psychotic disorder with delusions due to known physiological condition: Secondary | ICD-10-CM | POA: Diagnosis not present

## 2017-02-07 DIAGNOSIS — J9611 Chronic respiratory failure with hypoxia: Secondary | ICD-10-CM | POA: Diagnosis not present

## 2017-02-07 DIAGNOSIS — F0150 Vascular dementia without behavioral disturbance: Secondary | ICD-10-CM | POA: Diagnosis not present

## 2017-02-07 DIAGNOSIS — E119 Type 2 diabetes mellitus without complications: Secondary | ICD-10-CM | POA: Diagnosis not present

## 2017-02-19 DIAGNOSIS — G309 Alzheimer's disease, unspecified: Secondary | ICD-10-CM | POA: Diagnosis not present

## 2017-02-19 DIAGNOSIS — F062 Psychotic disorder with delusions due to known physiological condition: Secondary | ICD-10-CM | POA: Diagnosis not present

## 2017-03-05 NOTE — Progress Notes (Deleted)
Cardiology Office Note  Date:  03/05/2017   ID:  ALISHIA LEBO, DOB 1925-08-05, MRN [id number]  PCP:  Loura Pardon, MD   No chief complaint on file.   HPI:  Ms. Kingdon is a 83 year old woman with history of  large hiatal hernia,  pulmonary disease/COPD on chronic prednisone,  chronic pancreatitis   admission to the hospital 10/01/2013 with discharge 10/04/2013 with shortness of breath. Nonischemic cardiomyopathy ,  ejection fraction 30% in 2015 Cardiac catheterization 10/04/2013 showed no significant CAD She presents for routine follow-up of her chronic systolic CHF  Recently in the emergency room 01/19/2017 for altered mental status Evaluated for aggressive behavior Returned back to [facility] [place]  presents with her daughter  moved up to skilled nursing for mobility issues, requiring full oxygen Mild ankle swelling, recent bruising to her right lower extremity  Reports that breathing is stable on Lasix 20 mg daily Lab work June 2017 reviewed showing minimal elevation BUN, stable creatinine Weight is stable over the past 6 months, 120 pounds approximately in clinic  EKG on today's visit shows sinus tachycardia with rate 102 bpm, poor R-wave progression to the anterior precordial leads, left axis deviation  Other past medical history reviewed She was given diuretics with subsequent acute renal failure. creatinine increased from 1.15 up to 2.58. BUN increased from 28 to 47 . Diuretics were held, fluids initiated with improvement of her renal function back to her baseline . readmitted to the hospital 10/06/2013 for hypotension felt secondary to medications. Systolic pressure was 80. She was given fluids, Lasix was held temporarily. Hold parameters placed on her medications .  Weight has been dropping over the past several years, stable recently [facility] with a walker, no recent falls, tries to go walking every day  Echocardiogram from the hospital 10/01/2013 shows ejection fraction  30-35%, mention of possible anterior wall hypokinesis. This was not noted on cardiac catheterization. Repeat echocardiogram 02/23/2014 shows ejection fraction 25-30%, mildly elevated right ventricular systolic pressures, mild aortic valve stenosi for elevated cardiac enzymes   PMH:   has a past medical history of Arthritis; Cancer ([place]); Cerumen impaction; CHF (congestive heart failure) ([place]); Constipation; Cough; Diarrhea; GERD (gastroesophageal reflux disease); Hiatal hernia; Hyperlipidemia; Osteoporosis; Overactive bladder; Pancreatic insufficiency; RLS (restless legs syndrome); Shingles; Unexplained weight loss; and Vitamin D deficiency.  PSH:    Past Surgical History:  Procedure Laterality Date  . CARDIAC CATHETERIZATION  10/14   [facility];no stent  . CATARACT EXTRACTION     patient unsure of date procedure was done.  Marland Kitchen CATARACT EXTRACTION  11/08   2nd cataract  . ENDOSCOPIC RETROGRADE CHOLANGIOPANCREATOGRAPHY (ERCP) WITH PROPOFOL N/A 01/28/2013   Procedure: ENDOSCOPIC RETROGRADE CHOLANGIOPANCREATOGRAPHY (ERCP) WITH PROPOFOL;  Surgeon: Milus Banister, MD;  Location: [facility];  Service: Endoscopy;  Laterality: N/A;  . ERCP  03/24/2012   Procedure: ENDOSCOPIC RETROGRADE CHOLANGIOPANCREATOGRAPHY (ERCP);  Surgeon: Milus Banister, MD;  Location: [facility];  Service: Endoscopy;  Laterality: N/A;  . EUS  04/02/2012   Procedure: ESOPHAGEAL ENDOSCOPIC ULTRASOUND (EUS) RADIAL;  Surgeon: Milus Banister, MD;  Location: [facility];  Service: Endoscopy;  Laterality: N/A;  EUS with FNA  . FINE NEEDLE ASPIRATION  04/02/2012   Procedure: FINE NEEDLE ASPIRATION (FNA) LINEAR;  Surgeon: Milus Banister, MD;  Location: [facility];  Service: Endoscopy;  Laterality: N/A;    Current Outpatient Prescriptions  Medication Sig Dispense Refill  . alendronate (FOSAMAX) 70 MG tablet Take 70 mg by mouth once a week. Pt takes on Sunday.  Take with a full glass of water on an empty stomach.    Marland Kitchen aspirin EC 81  MG tablet Take 81 mg by mouth daily.    . Calcium Carbonate-Vitamin D (CALTRATE 600+D) 600-400 MG-UNIT tablet Take 2 tablets by mouth daily.    . carvedilol (COREG) 3.125 MG tablet Take 3.125 mg by mouth 2 (two) times daily with a meal.    . cholecalciferol (VITAMIN D) 1000 units tablet Chew 2,000 Units by mouth daily.    Marland Kitchen donepezil (ARICEPT) 10 MG tablet Take 10 mg by mouth at bedtime.    Marland Kitchen esomeprazole (NEXIUM) 40 MG capsule Take 40 mg by mouth daily.     . fluticasone furoate-vilanterol (BREO ELLIPTA) 100-25 MCG/INH AEPB Inhale 1 puff into the lungs daily.    . furosemide (LASIX) 20 MG tablet Take 20 mg by mouth daily.     Marland Kitchen loratadine (CLARITIN) 10 MG tablet Take 1 tablet (10 mg total) by mouth daily as needed for allergies. 90 tablet 3  . memantine (NAMENDA) 5 MG tablet Take 5 mg by mouth 2 (two) times daily.    . mirtazapine (REMERON) 15 MG tablet Take 15 mg by mouth at bedtime.    . Olopatadine HCl (PAZEO) 0.7 % SOLN Place 1 drop into both eyes at bedtime.    . Pancrelipase, Lip-Prot-Amyl, (CREON) 3000-9500 units CPEP Take 1 capsule by mouth 3 (three) times daily with meals.    Vladimir Faster Glycol-Propyl Glycol (SYSTANE) 0.4-0.3 % GEL ophthalmic gel Place 1-2 application into both eyes 4 (four) times daily as needed (for dry eyes).    . polyethylene glycol (MIRALAX / GLYCOLAX) packet Take 17 g by mouth daily as needed for mild constipation.    . potassium chloride (K-DUR) 10 MEQ tablet Take 10 mEq by mouth daily.    . predniSONE (DELTASONE) 10 MG tablet Take 10 mg by mouth every other day.    . predniSONE (DELTASONE) 5 MG tablet Take 5 mg by mouth every other day.    . sertraline (ZOLOFT) 50 MG tablet Take 25 mg by mouth daily. Take everyday at 4 PM.    . traMADol (ULTRAM) 50 MG tablet Take 50 mg by mouth 2 (two) times daily. Pt is able to take an additional tablet daily if needed for pain.     No current facility-administered medications for this visit.      Allergies:   Lactose  intolerance (gi); Hydrocodone; and Penicillins   Social History:  The patient  reports that she quit smoking about 24 years ago. Her smoking use included Cigarettes. She has a 5.00 pack-year smoking history. She has never used smokeless tobacco. She reports that she does not drink alcohol or use drugs.   Family History:   family history includes Alcohol abuse in her son; Cancer in her other; Diabetes in her brother and sister; Fibromyalgia in her daughter; Heart disease in her brother, brother, and mother; Heart failure in her father; Hypertension in her sister; Pneumonia in her father.    Review of Systems: ROS   PHYSICAL EXAM: VS:  There were no vitals taken for this visit. , BMI There is no height or weight on file to calculate BMI. GEN: Well nourished, well developed, in no acute distress HEENT: normal Neck: no JVD, carotid bruits, or masses Cardiac: RRR; no murmurs, rubs, or gallops,no edema  Respiratory:  clear to auscultation bilaterally, normal work of breathing GI: soft, nontender, nondistended, + BS MS: no deformity or atrophy Skin: warm and  dry, no rash Neuro:  Strength and sensation are intact Psych: euthymic mood, full affect    Recent Labs: 01/19/2017: ALT 15; BUN 24; Creatinine, Ser 1.10; Hemoglobin 12.8; Platelets 267; Potassium 4.5; Sodium 141; TSH 8.688    Lipid Panel Lab Results  Component Value Date   CHOL 201 (H) 10/30/2015   HDL 67.90 10/30/2015   LDLCALC 113 (H) 10/30/2015   TRIG 103.0 10/30/2015      Wt Readings from Last 3 Encounters:  01/19/17 130 lb (59 kg)  06/26/16 120 lb 4 oz (54.5 kg)  06/20/16 122 lb (55.3 kg)       ASSESSMENT AND PLAN:  No diagnosis found.   Disposition:   F/U  6 months  No orders of the defined types were placed in this encounter.    Signed, Esmond Plants, M.D., Ph.D. 03/05/2017  [place], [place]

## 2017-03-06 ENCOUNTER — Ambulatory Visit: Payer: Medicare Other | Admitting: Cardiovascular Disease

## 2017-03-27 ENCOUNTER — Ambulatory Visit: Payer: Medicare Other | Admitting: Internal Medicine

## 2017-03-27 NOTE — Progress Notes (Deleted)
Patient ID: Bridget Walker, female   DOB: 1925/02/09, 83 y.o.   MRN: [id number] Cardiology Office Note  Date:  03/27/2017   ID:  Bridget Walker, DOB 23-Sep-1925, MRN [id number]  PCP:  Bridget Pardon, MD   No chief complaint on file.   HPI:  Bridget Walker is a 83 year old woman with history of  large hiatal hernia,  pulmonary disease/COPD on chronic prednisone,  chronic pancreatitis  admission to the hospital 10/01/2013 with discharge 10/04/2013 with shortness of breath. cardiomyopathy Cardiac catheterization 10/04/2013 showed no significant CAD Echocardiogram showed ejection fraction 30% in 2015 She presents for routine follow-up of her chronic systolic CHF  She presents with her daughter on today's visit She has recently moved up to skilled nursing for mobility issues, requiring full oxygen Mild ankle swelling, recent bruising to her right lower extremity Overall is happy with no new complaints Reports that breathing is stable Back on Lasix 20 mg daily Lab work June 2017 reviewed showing minimal elevation BUN, stable creatinine Weight is stable over the past 6 months, 120 pounds approximately in clinic  EKG on today's visit shows sinus tachycardia with rate 102 bpm, poor R-wave progression to the anterior precordial leads, left axis deviation  Other past medical history reviewed She was given diuretics with subsequent acute renal failure. creatinine increased from 1.15 up to 2.58. BUN increased from 28 to 47 . Diuretics were held, fluids initiated with improvement of her renal function back to her baseline . readmitted to the hospital 10/06/2013 for hypotension felt secondary to medications. Systolic pressure was 80. She was given fluids, Lasix was held temporarily. Hold parameters placed on her medications .  Weight has been dropping over the past several years, stable recently [facility] with a walker, no recent falls, tries to go walking every day  Echocardiogram from the hospital 10/01/2013  shows ejection fraction 30-35%, mention of possible anterior wall hypokinesis. This was not noted on cardiac catheterization. Repeat echocardiogram 02/23/2014 shows ejection fraction 25-30%, mildly elevated right ventricular systolic pressures, mild aortic valve stenosi for elevated cardiac enzymes  PMH:   has a past medical history of Arthritis; Cancer ([place]); Cerumen impaction; CHF (congestive heart failure) ([place]); Constipation; Cough; Diarrhea; GERD (gastroesophageal reflux disease); Hiatal hernia; Hyperlipidemia; Osteoporosis; Overactive bladder; Pancreatic insufficiency; RLS (restless legs syndrome); Shingles; Unexplained weight loss; and Vitamin D deficiency.  PSH:    Past Surgical History:  Procedure Laterality Date  . CARDIAC CATHETERIZATION  10/14   [facility];no stent  . CATARACT EXTRACTION     patient unsure of date procedure was done.  Marland Kitchen CATARACT EXTRACTION  11/08   2nd cataract  . ENDOSCOPIC RETROGRADE CHOLANGIOPANCREATOGRAPHY (ERCP) WITH PROPOFOL N/A 01/28/2013   Procedure: ENDOSCOPIC RETROGRADE CHOLANGIOPANCREATOGRAPHY (ERCP) WITH PROPOFOL;  Surgeon: Milus Banister, MD;  Location: [facility];  Service: Endoscopy;  Laterality: N/A;  . ERCP  03/24/2012   Procedure: ENDOSCOPIC RETROGRADE CHOLANGIOPANCREATOGRAPHY (ERCP);  Surgeon: Milus Banister, MD;  Location: [facility];  Service: Endoscopy;  Laterality: N/A;  . EUS  04/02/2012   Procedure: ESOPHAGEAL ENDOSCOPIC ULTRASOUND (EUS) RADIAL;  Surgeon: Milus Banister, MD;  Location: [facility];  Service: Endoscopy;  Laterality: N/A;  EUS with FNA  . FINE NEEDLE ASPIRATION  04/02/2012   Procedure: FINE NEEDLE ASPIRATION (FNA) LINEAR;  Surgeon: Milus Banister, MD;  Location: [facility];  Service: Endoscopy;  Laterality: N/A;    Current Outpatient Prescriptions  Medication Sig Dispense Refill  . alendronate (FOSAMAX) 70 MG tablet Take 70 mg by mouth once a  week. Pt takes on Sunday.   Take with a full glass of water on an empty  stomach.    Marland Kitchen aspirin EC 81 MG tablet Take 81 mg by mouth daily.    . Calcium Carbonate-Vitamin D (CALTRATE 600+D) 600-400 MG-UNIT tablet Take 2 tablets by mouth daily.    . carvedilol (COREG) 3.125 MG tablet Take 3.125 mg by mouth 2 (two) times daily with a meal.    . cholecalciferol (VITAMIN D) 1000 units tablet Chew 2,000 Units by mouth daily.    Marland Kitchen donepezil (ARICEPT) 10 MG tablet Take 10 mg by mouth at bedtime.    Marland Kitchen esomeprazole (NEXIUM) 40 MG capsule Take 40 mg by mouth daily.     . fluticasone furoate-vilanterol (BREO ELLIPTA) 100-25 MCG/INH AEPB Inhale 1 puff into the lungs daily.    . furosemide (LASIX) 20 MG tablet Take 20 mg by mouth daily.     Marland Kitchen loratadine (CLARITIN) 10 MG tablet Take 1 tablet (10 mg total) by mouth daily as needed for allergies. 90 tablet 3  . memantine (NAMENDA) 5 MG tablet Take 5 mg by mouth 2 (two) times daily.    . mirtazapine (REMERON) 15 MG tablet Take 15 mg by mouth at bedtime.    . Olopatadine HCl (PAZEO) 0.7 % SOLN Place 1 drop into both eyes at bedtime.    . Pancrelipase, Lip-Prot-Amyl, (CREON) 3000-9500 units CPEP Take 1 capsule by mouth 3 (three) times daily with meals.    Vladimir Faster Glycol-Propyl Glycol (SYSTANE) 0.4-0.3 % GEL ophthalmic gel Place 1-2 application into both eyes 4 (four) times daily as needed (for dry eyes).    . polyethylene glycol (MIRALAX / GLYCOLAX) packet Take 17 g by mouth daily as needed for mild constipation.    . potassium chloride (K-DUR) 10 MEQ tablet Take 10 mEq by mouth daily.    . predniSONE (DELTASONE) 10 MG tablet Take 10 mg by mouth every other day.    . predniSONE (DELTASONE) 5 MG tablet Take 5 mg by mouth every other day.    . sertraline (ZOLOFT) 50 MG tablet Take 25 mg by mouth daily. Take everyday at 4 PM.    . traMADol (ULTRAM) 50 MG tablet Take 50 mg by mouth 2 (two) times daily. Pt is able to take an additional tablet daily if needed for pain.     No current facility-administered medications for this visit.       Allergies:   Lactose intolerance (gi); Hydrocodone; and Penicillins   Social History:  The patient  reports that she quit smoking about 25 years ago. Her smoking use included Cigarettes. She has a 5.00 pack-year smoking history. She has never used smokeless tobacco. She reports that she does not drink alcohol or use drugs.   Family History:   family history includes Alcohol abuse in her son; Cancer in her other; Diabetes in her brother and sister; Fibromyalgia in her daughter; Heart disease in her brother, brother, and mother; Heart failure in her father; Hypertension in her sister; Pneumonia in her father.    Review of Systems: Review of Systems  Respiratory: Positive for shortness of breath.   Cardiovascular: Positive for leg swelling.  Gastrointestinal: Negative.   Musculoskeletal: Positive for falls.       Difficulty with ambulation, walks with a walker  Neurological: Positive for weakness.  Psychiatric/Behavioral: Negative.   All other systems reviewed and are negative.    PHYSICAL EXAM: VS:  There were no vitals taken for this visit. ,  BMI There is no height or weight on file to calculate BMI. GEN: Well nourished, well developed, in no acute distress, presenting with a walker, on oxygen HEENT: normal Neck: no JVD, carotid bruits, or masses Cardiac: RRR; no murmurs, rubs, or gallops, trace ankle edema bilaterally Respiratory:  Mildly decreased breath sounds throughout bilaterally, normal work of breathing GI: soft, nontender, nondistended, + BS MS: no deformity or atrophy Skin: warm and dry, no rash Neuro:  Strength and sensation are intact Psych: euthymic mood, full affect    Recent Labs: 01/19/2017: ALT 15; BUN 24; Creatinine, Ser 1.10; Hemoglobin 12.8; Platelets 267; Potassium 4.5; Sodium 141; TSH 8.688    Lipid Panel Lab Results  Component Value Date   CHOL 201 (H) 10/30/2015   HDL 67.90 10/30/2015   LDLCALC 113 (H) 10/30/2015   TRIG 103.0 10/30/2015       Wt Readings from Last 3 Encounters:  01/19/17 130 lb (59 kg)  06/26/16 120 lb 4 oz (54.5 kg)  06/20/16 122 lb (55.3 kg)       ASSESSMENT AND PLAN:   Chronic systolic CHF (congestive heart failure) ([place]) - Plan: EKG 12-Lead Tolerating Lasix 20 mg daily Encouraged her to stay on her carvedilol Will need periodic BMP She reports she is not drinking very much, daily fluids reviewed with her If BUN and creatinine start to climb, may need Lasix every other day  COPD mixed type ([place]) Requiring oxygen around-the-clock, uses oxygen generator Stable  Mobility impaired Walks with a walker, increasing weakness, moved to skilled nursing Rare falls    Total encounter time more than 25 minutes  Greater than 50% was spent in counseling and coordination of care with the patient   Disposition:   F/U  6 months   No orders of the defined types were placed in this encounter.    Signed, Esmond Plants, M.D., Ph.D. 03/27/2017  [place], [place]

## 2017-03-28 ENCOUNTER — Ambulatory Visit: Payer: Medicare Other | Admitting: Cardiovascular Disease

## 2017-03-31 ENCOUNTER — Ambulatory Visit: Payer: Medicare Other | Admitting: Internal Medicine

## 2017-04-15 DIAGNOSIS — E119 Type 2 diabetes mellitus without complications: Secondary | ICD-10-CM | POA: Diagnosis not present

## 2017-04-15 DIAGNOSIS — F39 Unspecified mood [affective] disorder: Secondary | ICD-10-CM | POA: Diagnosis not present

## 2017-04-15 DIAGNOSIS — F0150 Vascular dementia without behavioral disturbance: Secondary | ICD-10-CM | POA: Diagnosis not present

## 2017-04-15 DIAGNOSIS — J439 Emphysema, unspecified: Secondary | ICD-10-CM | POA: Diagnosis not present

## 2017-04-15 DIAGNOSIS — F22 Delusional disorders: Secondary | ICD-10-CM | POA: Diagnosis not present

## 2017-04-17 DIAGNOSIS — E109 Type 1 diabetes mellitus without complications: Secondary | ICD-10-CM | POA: Diagnosis not present

## 2017-04-17 DIAGNOSIS — D539 Nutritional anemia, unspecified: Secondary | ICD-10-CM | POA: Diagnosis not present

## 2017-05-11 DIAGNOSIS — K449 Diaphragmatic hernia without obstruction or gangrene: Secondary | ICD-10-CM | POA: Insufficient documentation

## 2017-05-11 NOTE — Progress Notes (Deleted)
Patient ID: Bridget Walker, female   DOB: 04-Jul-1925, 83 y.o.   MRN: [id number] Cardiology Office Note  Date:  05/11/2017   ID:  Bridget Walker, DOB 1925/04/15, MRN [id number]  PCP:  Abner Greenspan, MD   No chief complaint on file.   HPI:  Bridget Walker is a 83 year old woman with history of large hiatal hernia, pulmonary disease/COPD on chronic prednisone,  chronic pancreatitis   hospital 10/01/2013 with shortness of breath, diagnosed with cardiomyopathy   catheterization 10/04/2013 showed no significant CAD Echocardiogram showed ejection fraction 30% in 2015 She presents for routine follow-up of her chronic systolic CHF  She presents with her daughter on today's visit She has recently moved up to skilled nursing for mobility issues, requiring full oxygen Mild ankle swelling, recent bruising to her right lower extremity Overall is happy with no new complaints Reports that breathing is stable Back on Lasix 20 mg daily Lab work June 2017 reviewed showing minimal elevation BUN, stable creatinine Weight is stable over the past 6 months, 120 pounds approximately in clinic  EKG on today's visit shows sinus tachycardia with rate 102 bpm, poor R-wave progression to the anterior precordial leads, left axis deviation  Other past medical history reviewed She was given diuretics with subsequent acute renal failure. creatinine increased from 1.15 up to 2.58. BUN increased from 28 to 47 . Diuretics were held, fluids initiated with improvement of her renal function back to her baseline . readmitted to the hospital 10/06/2013 for hypotension felt secondary to medications. Systolic pressure was 80. She was given fluids, Lasix was held temporarily. Hold parameters placed on her medications .  Weight has been dropping over the past several years, stable recently [employer] with a walker, no recent falls, tries to go walking every day  Echocardiogram from the hospital 10/01/2013 shows ejection fraction 30-35%,  mention of possible anterior wall hypokinesis. This was not noted on cardiac catheterization. Repeat echocardiogram 02/23/2014 shows ejection fraction 25-30%, mildly elevated right ventricular systolic pressures, mild aortic valve stenosi for elevated cardiac enzymes  PMH:   has a past medical history of Arthritis; Cancer ([place]); Cerumen impaction; CHF (congestive heart failure) ([place]); Constipation; Cough; Diarrhea; GERD (gastroesophageal reflux disease); Hiatal hernia; Hyperlipidemia; Osteoporosis; Overactive bladder; Pancreatic insufficiency; RLS (restless legs syndrome); Shingles; Unexplained weight loss; and Vitamin D deficiency.  PSH:    Past Surgical History:  Procedure Laterality Date  . CARDIAC CATHETERIZATION  10/14   [facility];no stent  . CATARACT EXTRACTION     patient unsure of date procedure was done.  Marland Kitchen CATARACT EXTRACTION  11/08   2nd cataract  . ENDOSCOPIC RETROGRADE CHOLANGIOPANCREATOGRAPHY (ERCP) WITH PROPOFOL N/A 01/28/2013   Procedure: ENDOSCOPIC RETROGRADE CHOLANGIOPANCREATOGRAPHY (ERCP) WITH PROPOFOL;  Surgeon: Milus Banister, MD;  Location: [facility] END[facility]COPY;  Service: Endoscopy;  Laterality: N/A;  . ERCP  03/24/2012   Procedure: ENDOSCOPIC RETROGRADE CHOLANGIOPANCREATOGRAPHY (ERCP);  Surgeon: Milus Banister, MD;  Location: [facility];  Service: Endoscopy;  Laterality: N/A;  . EUS  04/02/2012   Procedure: ESOPHAGEAL ENDOSCOPIC ULTRASOUND (EUS) RADIAL;  Surgeon: Milus Banister, MD;  Location: [facility];  Service: Endoscopy;  Laterality: N/A;  EUS with FNA  . FINE NEEDLE ASPIRATION  04/02/2012   Procedure: FINE NEEDLE ASPIRATION (FNA) LINEAR;  Surgeon: Milus Banister, MD;  Location: [facility];  Service: Endoscopy;  Laterality: N/A;    Current Outpatient Prescriptions  Medication Sig Dispense Refill  . alendronate (FOSAMAX) 70 MG tablet Take 70 mg by mouth once a week. Pt takes  on Sunday.   Take with a full glass of water on an empty stomach.    Marland Kitchen aspirin EC 81 MG tablet  Take 81 mg by mouth daily.    . Calcium Carbonate-Vitamin D (CALTRATE 600+D) 600-400 MG-UNIT tablet Take 2 tablets by mouth daily.    . carvedilol (COREG) 3.125 MG tablet Take 3.125 mg by mouth 2 (two) times daily with a meal.    . cholecalciferol (VITAMIN D) 1000 units tablet Chew 2,000 Units by mouth daily.    Marland Kitchen donepezil (ARICEPT) 10 MG tablet Take 10 mg by mouth at bedtime.    Marland Kitchen esomeprazole (NEXIUM) 40 MG capsule Take 40 mg by mouth daily.     . fluticasone furoate-vilanterol (BREO ELLIPTA) 100-25 MCG/INH AEPB Inhale 1 puff into the lungs daily.    . furosemide (LASIX) 20 MG tablet Take 20 mg by mouth daily.     Marland Kitchen loratadine (CLARITIN) 10 MG tablet Take 1 tablet (10 mg total) by mouth daily as needed for allergies. 90 tablet 3  . memantine (NAMENDA) 5 MG tablet Take 5 mg by mouth 2 (two) times daily.    . mirtazapine (REMERON) 15 MG tablet Take 15 mg by mouth at bedtime.    . Olopatadine HCl (PAZEO) 0.7 % SOLN Place 1 drop into both eyes at bedtime.    . Pancrelipase, Lip-Prot-Amyl, (CREON) 3000-9500 units CPEP Take 1 capsule by mouth 3 (three) times daily with meals.    Vladimir Faster Glycol-Propyl Glycol (SYSTANE) 0.4-0.3 % GEL ophthalmic gel Place 1-2 application into both eyes 4 (four) times daily as needed (for dry eyes).    . polyethylene glycol (MIRALAX / GLYCOLAX) packet Take 17 g by mouth daily as needed for mild constipation.    . potassium chloride (K-DUR) 10 MEQ tablet Take 10 mEq by mouth daily.    . predniSONE (DELTASONE) 10 MG tablet Take 10 mg by mouth every other day.    . predniSONE (DELTASONE) 5 MG tablet Take 5 mg by mouth every other day.    . sertraline (ZOLOFT) 50 MG tablet Take 25 mg by mouth daily. Take everyday at 4 PM.    . traMADol (ULTRAM) 50 MG tablet Take 50 mg by mouth 2 (two) times daily. Pt is able to take an additional tablet daily if needed for pain.     No current facility-administered medications for this visit.      Allergies:   Lactose intolerance  (gi); Hydrocodone; and Penicillins   Social History:  The patient  reports that she quit smoking about 25 years ago. Her smoking use included Cigarettes. She has a 5.00 pack-year smoking history. She has never used smokeless tobacco. She reports that she does not drink alcohol or use drugs.   Family History:   family history includes Alcohol abuse in her son; Cancer in her other; Diabetes in her brother and sister; Fibromyalgia in her daughter; Heart disease in her brother, brother, and mother; Heart failure in her father; Hypertension in her sister; Pneumonia in her father.    Review of Systems: Review of Systems  Respiratory: Positive for shortness of breath.   Cardiovascular: Positive for leg swelling.  Gastrointestinal: Negative.   Musculoskeletal: Positive for falls.       Difficulty with ambulation, walks with a walker  Neurological: Positive for weakness.  Psychiatric/Behavioral: Negative.   All other systems reviewed and are negative.    PHYSICAL EXAM: VS:  There were no vitals taken for this visit. , BMI There is  no height or weight on file to calculate BMI. GEN: Well nourished, well developed, in no acute distress, presenting with a walker, on oxygen HEENT: normal Neck: no JVD, carotid bruits, or masses Cardiac: RRR; no murmurs, rubs, or gallops, trace ankle edema bilaterally Respiratory:  Mildly decreased breath sounds throughout bilaterally, normal work of breathing GI: soft, nontender, nondistended, + BS MS: no deformity or atrophy Skin: warm and dry, no rash Neuro:  Strength and sensation are intact Psych: euthymic mood, full affect    Recent Labs: 01/19/2017: ALT 15; BUN 24; Creatinine, Ser 1.10; Hemoglobin 12.8; Platelets 267; Potassium 4.5; Sodium 141; TSH 8.688    Lipid Panel Lab Results  Component Value Date   CHOL 201 (H) 10/30/2015   HDL 67.90 10/30/2015   LDLCALC 113 (H) 10/30/2015   TRIG 103.0 10/30/2015      Wt Readings from Last 3 Encounters:   01/19/17 130 lb (59 kg)  06/26/16 120 lb 4 oz (54.5 kg)  06/20/16 122 lb (55.3 kg)       ASSESSMENT AND PLAN:   Chronic systolic CHF (congestive heart failure) ([place]) - Plan: EKG 12-Lead Tolerating Lasix 20 mg daily Encouraged her to stay on her carvedilol Will need periodic BMP She reports she is not drinking very much, daily fluids reviewed with her If BUN and creatinine start to climb, may need Lasix every other day  COPD mixed type ([place]) Requiring oxygen around-the-clock, uses oxygen generator Stable  Mobility impaired Walks with a walker, increasing weakness, moved to skilled nursing Rare falls    Total encounter time more than 25 minutes  Greater than 50% was spent in counseling and coordination of care with the patient   Disposition:   F/U  6 months   No orders of the defined types were placed in this encounter.    Signed, Esmond Plants, M.D., Ph.D. 05/11/2017  [place], [place]

## 2017-05-13 ENCOUNTER — Ambulatory Visit: Payer: Medicare Other | Admitting: Cardiovascular Disease

## 2017-06-17 ENCOUNTER — Telehealth: Payer: Self-pay | Admitting: Cardiovascular Disease

## 2017-06-17 NOTE — Telephone Encounter (Signed)
Attempted to schedule fu from recall. Per daughter patient ams Delman Kitten has progressed and she cannot be easily transported via car to an appt.  Per daughter patient may not let anyone take her.  She would like to know what we do in situations like this.  Please call to discuss.

## 2017-06-17 NOTE — Telephone Encounter (Signed)
S/w patient's daughter, ok per DPR. Patient's dementia has worsened to the point that patient refuses to go anywhere with anyone. At times combative.  Patient is under the care of [name] at [facility] at this time. Daughter feels the only way patient could come would be by ambulance and then patient would probably be very upset by the time she got here.  Advised to continue care with [name] and that I would make Dr Rockey Situ aware. Advised for her to have [name] call us if any cardiology concerns arise. She was very [employer]. Patient is due for annual office visit with Dr Rockey Situ this month.

## 2017-06-19 NOTE — Telephone Encounter (Signed)
FYI thx Brantley Fling

## 2017-06-20 NOTE — Telephone Encounter (Signed)
There have been no cardiac issues of late. I will let you know if there are any problems.

## 2017-06-23 ENCOUNTER — Ambulatory Visit: Payer: Medicare Other | Admitting: Internal Medicine

## 2017-06-27 DIAGNOSIS — J449 Chronic obstructive pulmonary disease, unspecified: Secondary | ICD-10-CM | POA: Diagnosis not present

## 2017-06-27 DIAGNOSIS — J9611 Chronic respiratory failure with hypoxia: Secondary | ICD-10-CM | POA: Diagnosis not present

## 2017-06-27 DIAGNOSIS — I5020 Unspecified systolic (congestive) heart failure: Secondary | ICD-10-CM | POA: Diagnosis not present

## 2017-06-27 DIAGNOSIS — M810 Age-related osteoporosis without current pathological fracture: Secondary | ICD-10-CM | POA: Diagnosis not present

## 2017-06-27 DIAGNOSIS — F0150 Vascular dementia without behavioral disturbance: Secondary | ICD-10-CM | POA: Diagnosis not present

## 2017-06-27 DIAGNOSIS — K8681 Exocrine pancreatic insufficiency: Secondary | ICD-10-CM | POA: Diagnosis not present

## 2017-06-27 DIAGNOSIS — K219 Gastro-esophageal reflux disease without esophagitis: Secondary | ICD-10-CM | POA: Diagnosis not present

## 2017-06-27 DIAGNOSIS — E1140 Type 2 diabetes mellitus with diabetic neuropathy, unspecified: Secondary | ICD-10-CM | POA: Diagnosis not present

## 2017-06-27 DIAGNOSIS — F39 Unspecified mood [affective] disorder: Secondary | ICD-10-CM | POA: Diagnosis not present

## 2017-07-04 ENCOUNTER — Other Ambulatory Visit: Payer: Self-pay

## 2017-07-25 DIAGNOSIS — B351 Tinea unguium: Secondary | ICD-10-CM | POA: Diagnosis not present

## 2017-08-21 DIAGNOSIS — F0150 Vascular dementia without behavioral disturbance: Secondary | ICD-10-CM | POA: Diagnosis not present

## 2017-08-21 DIAGNOSIS — I5022 Chronic systolic (congestive) heart failure: Secondary | ICD-10-CM | POA: Diagnosis not present

## 2017-08-21 DIAGNOSIS — E119 Type 2 diabetes mellitus without complications: Secondary | ICD-10-CM | POA: Diagnosis not present

## 2017-08-21 DIAGNOSIS — F39 Unspecified mood [affective] disorder: Secondary | ICD-10-CM | POA: Diagnosis not present

## 2017-08-21 DIAGNOSIS — E441 Mild protein-calorie malnutrition: Secondary | ICD-10-CM | POA: Diagnosis not present

## 2017-08-21 DIAGNOSIS — F062 Psychotic disorder with delusions due to known physiological condition: Secondary | ICD-10-CM | POA: Diagnosis not present

## 2017-08-21 DIAGNOSIS — J439 Emphysema, unspecified: Secondary | ICD-10-CM | POA: Diagnosis not present

## 2017-10-13 DIAGNOSIS — E119 Type 2 diabetes mellitus without complications: Secondary | ICD-10-CM | POA: Diagnosis not present

## 2017-10-13 DIAGNOSIS — I5020 Unspecified systolic (congestive) heart failure: Secondary | ICD-10-CM | POA: Diagnosis not present

## 2017-10-22 DIAGNOSIS — F39 Unspecified mood [affective] disorder: Secondary | ICD-10-CM

## 2017-10-22 DIAGNOSIS — F22 Delusional disorders: Secondary | ICD-10-CM

## 2017-10-22 DIAGNOSIS — E119 Type 2 diabetes mellitus without complications: Secondary | ICD-10-CM

## 2017-10-22 DIAGNOSIS — K219 Gastro-esophageal reflux disease without esophagitis: Secondary | ICD-10-CM

## 2017-10-22 DIAGNOSIS — J449 Chronic obstructive pulmonary disease, unspecified: Secondary | ICD-10-CM

## 2017-10-22 DIAGNOSIS — K8681 Exocrine pancreatic insufficiency: Secondary | ICD-10-CM

## 2017-10-22 DIAGNOSIS — I5020 Unspecified systolic (congestive) heart failure: Secondary | ICD-10-CM

## 2017-10-22 DIAGNOSIS — E43 Unspecified severe protein-calorie malnutrition: Secondary | ICD-10-CM

## 2017-10-22 DIAGNOSIS — F0150 Vascular dementia without behavioral disturbance: Secondary | ICD-10-CM

## 2017-11-26 DIAGNOSIS — B351 Tinea unguium: Secondary | ICD-10-CM | POA: Diagnosis not present

## 2017-12-25 DIAGNOSIS — F39 Unspecified mood [affective] disorder: Secondary | ICD-10-CM | POA: Diagnosis not present

## 2017-12-25 DIAGNOSIS — E441 Mild protein-calorie malnutrition: Secondary | ICD-10-CM | POA: Diagnosis not present

## 2017-12-25 DIAGNOSIS — I5022 Chronic systolic (congestive) heart failure: Secondary | ICD-10-CM | POA: Diagnosis not present

## 2017-12-25 DIAGNOSIS — E119 Type 2 diabetes mellitus without complications: Secondary | ICD-10-CM | POA: Diagnosis not present

## 2017-12-25 DIAGNOSIS — F0150 Vascular dementia without behavioral disturbance: Secondary | ICD-10-CM | POA: Diagnosis not present

## 2017-12-25 DIAGNOSIS — J439 Emphysema, unspecified: Secondary | ICD-10-CM | POA: Diagnosis not present

## 2018-01-30 DIAGNOSIS — L989 Disorder of the skin and subcutaneous tissue, unspecified: Secondary | ICD-10-CM | POA: Diagnosis not present

## 2018-02-25 DIAGNOSIS — K219 Gastro-esophageal reflux disease without esophagitis: Secondary | ICD-10-CM | POA: Diagnosis not present

## 2018-02-25 DIAGNOSIS — F0150 Vascular dementia without behavioral disturbance: Secondary | ICD-10-CM | POA: Diagnosis not present

## 2018-02-25 DIAGNOSIS — F39 Unspecified mood [affective] disorder: Secondary | ICD-10-CM | POA: Diagnosis not present

## 2018-02-25 DIAGNOSIS — I5020 Unspecified systolic (congestive) heart failure: Secondary | ICD-10-CM | POA: Diagnosis not present

## 2018-02-25 DIAGNOSIS — F22 Delusional disorders: Secondary | ICD-10-CM | POA: Diagnosis not present

## 2018-02-25 DIAGNOSIS — M1000 Idiopathic gout, unspecified site: Secondary | ICD-10-CM | POA: Diagnosis not present

## 2018-02-25 DIAGNOSIS — E119 Type 2 diabetes mellitus without complications: Secondary | ICD-10-CM | POA: Diagnosis not present

## 2018-02-25 DIAGNOSIS — J449 Chronic obstructive pulmonary disease, unspecified: Secondary | ICD-10-CM | POA: Diagnosis not present

## 2018-03-04 DIAGNOSIS — Z741 Need for assistance with personal care: Secondary | ICD-10-CM | POA: Diagnosis not present

## 2018-04-03 DIAGNOSIS — E169 Disorder of pancreatic internal secretion, unspecified: Secondary | ICD-10-CM | POA: Diagnosis not present

## 2018-04-03 DIAGNOSIS — N183 Chronic kidney disease, stage 3 (moderate): Secondary | ICD-10-CM | POA: Diagnosis not present

## 2018-04-05 DIAGNOSIS — J9611 Chronic respiratory failure with hypoxia: Secondary | ICD-10-CM | POA: Diagnosis not present

## 2018-04-05 DIAGNOSIS — E785 Hyperlipidemia, unspecified: Secondary | ICD-10-CM | POA: Diagnosis not present

## 2018-04-05 DIAGNOSIS — R911 Solitary pulmonary nodule: Secondary | ICD-10-CM | POA: Diagnosis not present

## 2018-04-05 DIAGNOSIS — E559 Vitamin D deficiency, unspecified: Secondary | ICD-10-CM | POA: Diagnosis not present

## 2018-04-05 DIAGNOSIS — Z7952 Long term (current) use of systemic steroids: Secondary | ICD-10-CM | POA: Diagnosis not present

## 2018-04-05 DIAGNOSIS — R630 Anorexia: Secondary | ICD-10-CM | POA: Diagnosis not present

## 2018-04-05 DIAGNOSIS — N183 Chronic kidney disease, stage 3 (moderate): Secondary | ICD-10-CM | POA: Diagnosis not present

## 2018-04-05 DIAGNOSIS — K219 Gastro-esophageal reflux disease without esophagitis: Secondary | ICD-10-CM | POA: Diagnosis not present

## 2018-04-05 DIAGNOSIS — R634 Abnormal weight loss: Secondary | ICD-10-CM | POA: Diagnosis not present

## 2018-04-05 DIAGNOSIS — Z681 Body mass index (BMI) 19 or less, adult: Secondary | ICD-10-CM | POA: Diagnosis not present

## 2018-04-05 DIAGNOSIS — I130 Hypertensive heart and chronic kidney disease with heart failure and stage 1 through stage 4 chronic kidney disease, or unspecified chronic kidney disease: Secondary | ICD-10-CM | POA: Diagnosis not present

## 2018-04-05 DIAGNOSIS — F419 Anxiety disorder, unspecified: Secondary | ICD-10-CM | POA: Diagnosis not present

## 2018-04-05 DIAGNOSIS — I5022 Chronic systolic (congestive) heart failure: Secondary | ICD-10-CM | POA: Diagnosis not present

## 2018-04-05 DIAGNOSIS — M810 Age-related osteoporosis without current pathological fracture: Secondary | ICD-10-CM | POA: Diagnosis not present

## 2018-04-05 DIAGNOSIS — F39 Unspecified mood [affective] disorder: Secondary | ICD-10-CM | POA: Diagnosis not present

## 2018-04-05 DIAGNOSIS — F0150 Vascular dementia without behavioral disturbance: Secondary | ICD-10-CM | POA: Diagnosis not present

## 2018-04-05 DIAGNOSIS — E119 Type 2 diabetes mellitus without complications: Secondary | ICD-10-CM | POA: Diagnosis not present

## 2018-04-05 DIAGNOSIS — E7800 Pure hypercholesterolemia, unspecified: Secondary | ICD-10-CM | POA: Diagnosis not present

## 2018-04-05 DIAGNOSIS — M1990 Unspecified osteoarthritis, unspecified site: Secondary | ICD-10-CM | POA: Diagnosis not present

## 2018-04-05 DIAGNOSIS — J439 Emphysema, unspecified: Secondary | ICD-10-CM | POA: Diagnosis not present

## 2018-04-06 DIAGNOSIS — F39 Unspecified mood [affective] disorder: Secondary | ICD-10-CM | POA: Diagnosis not present

## 2018-04-06 DIAGNOSIS — E785 Hyperlipidemia, unspecified: Secondary | ICD-10-CM | POA: Diagnosis not present

## 2018-04-06 DIAGNOSIS — F0150 Vascular dementia without behavioral disturbance: Secondary | ICD-10-CM | POA: Diagnosis not present

## 2018-04-06 DIAGNOSIS — I130 Hypertensive heart and chronic kidney disease with heart failure and stage 1 through stage 4 chronic kidney disease, or unspecified chronic kidney disease: Secondary | ICD-10-CM | POA: Diagnosis not present

## 2018-04-06 DIAGNOSIS — I5022 Chronic systolic (congestive) heart failure: Secondary | ICD-10-CM | POA: Diagnosis not present

## 2018-04-06 DIAGNOSIS — N183 Chronic kidney disease, stage 3 (moderate): Secondary | ICD-10-CM | POA: Diagnosis not present

## 2018-04-08 DIAGNOSIS — F39 Unspecified mood [affective] disorder: Secondary | ICD-10-CM | POA: Diagnosis not present

## 2018-04-08 DIAGNOSIS — R911 Solitary pulmonary nodule: Secondary | ICD-10-CM | POA: Diagnosis not present

## 2018-04-08 DIAGNOSIS — Z681 Body mass index (BMI) 19 or less, adult: Secondary | ICD-10-CM | POA: Diagnosis not present

## 2018-04-08 DIAGNOSIS — J9611 Chronic respiratory failure with hypoxia: Secondary | ICD-10-CM | POA: Diagnosis not present

## 2018-04-08 DIAGNOSIS — N183 Chronic kidney disease, stage 3 (moderate): Secondary | ICD-10-CM | POA: Diagnosis not present

## 2018-04-08 DIAGNOSIS — E785 Hyperlipidemia, unspecified: Secondary | ICD-10-CM | POA: Diagnosis not present

## 2018-04-08 DIAGNOSIS — E119 Type 2 diabetes mellitus without complications: Secondary | ICD-10-CM | POA: Diagnosis not present

## 2018-04-08 DIAGNOSIS — E559 Vitamin D deficiency, unspecified: Secondary | ICD-10-CM | POA: Diagnosis not present

## 2018-04-08 DIAGNOSIS — I5022 Chronic systolic (congestive) heart failure: Secondary | ICD-10-CM | POA: Diagnosis not present

## 2018-04-08 DIAGNOSIS — K219 Gastro-esophageal reflux disease without esophagitis: Secondary | ICD-10-CM | POA: Diagnosis not present

## 2018-04-08 DIAGNOSIS — F0150 Vascular dementia without behavioral disturbance: Secondary | ICD-10-CM | POA: Diagnosis not present

## 2018-04-08 DIAGNOSIS — R634 Abnormal weight loss: Secondary | ICD-10-CM | POA: Diagnosis not present

## 2018-04-08 DIAGNOSIS — F419 Anxiety disorder, unspecified: Secondary | ICD-10-CM | POA: Diagnosis not present

## 2018-04-08 DIAGNOSIS — E7800 Pure hypercholesterolemia, unspecified: Secondary | ICD-10-CM | POA: Diagnosis not present

## 2018-04-08 DIAGNOSIS — J439 Emphysema, unspecified: Secondary | ICD-10-CM | POA: Diagnosis not present

## 2018-04-08 DIAGNOSIS — R630 Anorexia: Secondary | ICD-10-CM | POA: Diagnosis not present

## 2018-04-08 DIAGNOSIS — I130 Hypertensive heart and chronic kidney disease with heart failure and stage 1 through stage 4 chronic kidney disease, or unspecified chronic kidney disease: Secondary | ICD-10-CM | POA: Diagnosis not present

## 2018-04-08 DIAGNOSIS — M810 Age-related osteoporosis without current pathological fracture: Secondary | ICD-10-CM | POA: Diagnosis not present

## 2018-04-08 DIAGNOSIS — Z7952 Long term (current) use of systemic steroids: Secondary | ICD-10-CM | POA: Diagnosis not present

## 2018-04-08 DIAGNOSIS — M1990 Unspecified osteoarthritis, unspecified site: Secondary | ICD-10-CM | POA: Diagnosis not present

## 2018-04-09 DIAGNOSIS — E785 Hyperlipidemia, unspecified: Secondary | ICD-10-CM | POA: Diagnosis not present

## 2018-04-09 DIAGNOSIS — I5020 Unspecified systolic (congestive) heart failure: Secondary | ICD-10-CM | POA: Diagnosis not present

## 2018-04-09 DIAGNOSIS — I130 Hypertensive heart and chronic kidney disease with heart failure and stage 1 through stage 4 chronic kidney disease, or unspecified chronic kidney disease: Secondary | ICD-10-CM | POA: Diagnosis not present

## 2018-04-09 DIAGNOSIS — J439 Emphysema, unspecified: Secondary | ICD-10-CM | POA: Diagnosis not present

## 2018-04-09 DIAGNOSIS — F0150 Vascular dementia without behavioral disturbance: Secondary | ICD-10-CM | POA: Diagnosis not present

## 2018-04-09 DIAGNOSIS — E119 Type 2 diabetes mellitus without complications: Secondary | ICD-10-CM | POA: Diagnosis not present

## 2018-04-09 DIAGNOSIS — F39 Unspecified mood [affective] disorder: Secondary | ICD-10-CM | POA: Diagnosis not present

## 2018-04-09 DIAGNOSIS — E440 Moderate protein-calorie malnutrition: Secondary | ICD-10-CM | POA: Diagnosis not present

## 2018-04-09 DIAGNOSIS — I5022 Chronic systolic (congestive) heart failure: Secondary | ICD-10-CM | POA: Diagnosis not present

## 2018-04-09 DIAGNOSIS — N183 Chronic kidney disease, stage 3 (moderate): Secondary | ICD-10-CM | POA: Diagnosis not present

## 2018-04-09 DIAGNOSIS — F0151 Vascular dementia with behavioral disturbance: Secondary | ICD-10-CM | POA: Diagnosis not present

## 2018-04-10 DIAGNOSIS — I130 Hypertensive heart and chronic kidney disease with heart failure and stage 1 through stage 4 chronic kidney disease, or unspecified chronic kidney disease: Secondary | ICD-10-CM | POA: Diagnosis not present

## 2018-04-10 DIAGNOSIS — F0150 Vascular dementia without behavioral disturbance: Secondary | ICD-10-CM | POA: Diagnosis not present

## 2018-04-10 DIAGNOSIS — F39 Unspecified mood [affective] disorder: Secondary | ICD-10-CM | POA: Diagnosis not present

## 2018-04-10 DIAGNOSIS — I5022 Chronic systolic (congestive) heart failure: Secondary | ICD-10-CM | POA: Diagnosis not present

## 2018-04-10 DIAGNOSIS — E785 Hyperlipidemia, unspecified: Secondary | ICD-10-CM | POA: Diagnosis not present

## 2018-04-10 DIAGNOSIS — N183 Chronic kidney disease, stage 3 (moderate): Secondary | ICD-10-CM | POA: Diagnosis not present

## 2018-04-13 DIAGNOSIS — E785 Hyperlipidemia, unspecified: Secondary | ICD-10-CM | POA: Diagnosis not present

## 2018-04-13 DIAGNOSIS — F39 Unspecified mood [affective] disorder: Secondary | ICD-10-CM | POA: Diagnosis not present

## 2018-04-13 DIAGNOSIS — F0150 Vascular dementia without behavioral disturbance: Secondary | ICD-10-CM | POA: Diagnosis not present

## 2018-04-13 DIAGNOSIS — I5022 Chronic systolic (congestive) heart failure: Secondary | ICD-10-CM | POA: Diagnosis not present

## 2018-04-13 DIAGNOSIS — I130 Hypertensive heart and chronic kidney disease with heart failure and stage 1 through stage 4 chronic kidney disease, or unspecified chronic kidney disease: Secondary | ICD-10-CM | POA: Diagnosis not present

## 2018-04-13 DIAGNOSIS — N183 Chronic kidney disease, stage 3 (moderate): Secondary | ICD-10-CM | POA: Diagnosis not present

## 2018-04-14 DIAGNOSIS — N183 Chronic kidney disease, stage 3 (moderate): Secondary | ICD-10-CM | POA: Diagnosis not present

## 2018-04-14 DIAGNOSIS — I130 Hypertensive heart and chronic kidney disease with heart failure and stage 1 through stage 4 chronic kidney disease, or unspecified chronic kidney disease: Secondary | ICD-10-CM | POA: Diagnosis not present

## 2018-04-14 DIAGNOSIS — I5022 Chronic systolic (congestive) heart failure: Secondary | ICD-10-CM | POA: Diagnosis not present

## 2018-04-14 DIAGNOSIS — F0150 Vascular dementia without behavioral disturbance: Secondary | ICD-10-CM | POA: Diagnosis not present

## 2018-04-14 DIAGNOSIS — F39 Unspecified mood [affective] disorder: Secondary | ICD-10-CM | POA: Diagnosis not present

## 2018-04-14 DIAGNOSIS — E785 Hyperlipidemia, unspecified: Secondary | ICD-10-CM | POA: Diagnosis not present

## 2018-04-15 DIAGNOSIS — N183 Chronic kidney disease, stage 3 (moderate): Secondary | ICD-10-CM | POA: Diagnosis not present

## 2018-04-15 DIAGNOSIS — E785 Hyperlipidemia, unspecified: Secondary | ICD-10-CM | POA: Diagnosis not present

## 2018-04-15 DIAGNOSIS — I5022 Chronic systolic (congestive) heart failure: Secondary | ICD-10-CM | POA: Diagnosis not present

## 2018-04-15 DIAGNOSIS — I130 Hypertensive heart and chronic kidney disease with heart failure and stage 1 through stage 4 chronic kidney disease, or unspecified chronic kidney disease: Secondary | ICD-10-CM | POA: Diagnosis not present

## 2018-04-15 DIAGNOSIS — F0150 Vascular dementia without behavioral disturbance: Secondary | ICD-10-CM | POA: Diagnosis not present

## 2018-04-15 DIAGNOSIS — F39 Unspecified mood [affective] disorder: Secondary | ICD-10-CM | POA: Diagnosis not present

## 2018-04-16 DIAGNOSIS — N183 Chronic kidney disease, stage 3 (moderate): Secondary | ICD-10-CM | POA: Diagnosis not present

## 2018-04-16 DIAGNOSIS — F39 Unspecified mood [affective] disorder: Secondary | ICD-10-CM | POA: Diagnosis not present

## 2018-04-16 DIAGNOSIS — I5022 Chronic systolic (congestive) heart failure: Secondary | ICD-10-CM | POA: Diagnosis not present

## 2018-04-16 DIAGNOSIS — F0150 Vascular dementia without behavioral disturbance: Secondary | ICD-10-CM | POA: Diagnosis not present

## 2018-04-16 DIAGNOSIS — I130 Hypertensive heart and chronic kidney disease with heart failure and stage 1 through stage 4 chronic kidney disease, or unspecified chronic kidney disease: Secondary | ICD-10-CM | POA: Diagnosis not present

## 2018-04-16 DIAGNOSIS — E785 Hyperlipidemia, unspecified: Secondary | ICD-10-CM | POA: Diagnosis not present

## 2018-04-17 DIAGNOSIS — I130 Hypertensive heart and chronic kidney disease with heart failure and stage 1 through stage 4 chronic kidney disease, or unspecified chronic kidney disease: Secondary | ICD-10-CM | POA: Diagnosis not present

## 2018-04-17 DIAGNOSIS — E785 Hyperlipidemia, unspecified: Secondary | ICD-10-CM | POA: Diagnosis not present

## 2018-04-17 DIAGNOSIS — F0150 Vascular dementia without behavioral disturbance: Secondary | ICD-10-CM | POA: Diagnosis not present

## 2018-04-17 DIAGNOSIS — N183 Chronic kidney disease, stage 3 (moderate): Secondary | ICD-10-CM | POA: Diagnosis not present

## 2018-04-17 DIAGNOSIS — F39 Unspecified mood [affective] disorder: Secondary | ICD-10-CM | POA: Diagnosis not present

## 2018-04-17 DIAGNOSIS — I5022 Chronic systolic (congestive) heart failure: Secondary | ICD-10-CM | POA: Diagnosis not present

## 2018-04-20 DIAGNOSIS — E785 Hyperlipidemia, unspecified: Secondary | ICD-10-CM | POA: Diagnosis not present

## 2018-04-20 DIAGNOSIS — I130 Hypertensive heart and chronic kidney disease with heart failure and stage 1 through stage 4 chronic kidney disease, or unspecified chronic kidney disease: Secondary | ICD-10-CM | POA: Diagnosis not present

## 2018-04-20 DIAGNOSIS — F0150 Vascular dementia without behavioral disturbance: Secondary | ICD-10-CM | POA: Diagnosis not present

## 2018-04-20 DIAGNOSIS — F39 Unspecified mood [affective] disorder: Secondary | ICD-10-CM | POA: Diagnosis not present

## 2018-04-20 DIAGNOSIS — N183 Chronic kidney disease, stage 3 (moderate): Secondary | ICD-10-CM | POA: Diagnosis not present

## 2018-04-20 DIAGNOSIS — I5022 Chronic systolic (congestive) heart failure: Secondary | ICD-10-CM | POA: Diagnosis not present

## 2018-04-21 DIAGNOSIS — F0150 Vascular dementia without behavioral disturbance: Secondary | ICD-10-CM | POA: Diagnosis not present

## 2018-04-21 DIAGNOSIS — F39 Unspecified mood [affective] disorder: Secondary | ICD-10-CM | POA: Diagnosis not present

## 2018-04-21 DIAGNOSIS — I130 Hypertensive heart and chronic kidney disease with heart failure and stage 1 through stage 4 chronic kidney disease, or unspecified chronic kidney disease: Secondary | ICD-10-CM | POA: Diagnosis not present

## 2018-04-21 DIAGNOSIS — I5022 Chronic systolic (congestive) heart failure: Secondary | ICD-10-CM | POA: Diagnosis not present

## 2018-04-21 DIAGNOSIS — E785 Hyperlipidemia, unspecified: Secondary | ICD-10-CM | POA: Diagnosis not present

## 2018-04-21 DIAGNOSIS — N183 Chronic kidney disease, stage 3 (moderate): Secondary | ICD-10-CM | POA: Diagnosis not present

## 2018-04-22 DIAGNOSIS — F39 Unspecified mood [affective] disorder: Secondary | ICD-10-CM | POA: Diagnosis not present

## 2018-04-22 DIAGNOSIS — F0150 Vascular dementia without behavioral disturbance: Secondary | ICD-10-CM | POA: Diagnosis not present

## 2018-04-22 DIAGNOSIS — I5022 Chronic systolic (congestive) heart failure: Secondary | ICD-10-CM | POA: Diagnosis not present

## 2018-04-22 DIAGNOSIS — N183 Chronic kidney disease, stage 3 (moderate): Secondary | ICD-10-CM | POA: Diagnosis not present

## 2018-04-22 DIAGNOSIS — I130 Hypertensive heart and chronic kidney disease with heart failure and stage 1 through stage 4 chronic kidney disease, or unspecified chronic kidney disease: Secondary | ICD-10-CM | POA: Diagnosis not present

## 2018-04-22 DIAGNOSIS — E785 Hyperlipidemia, unspecified: Secondary | ICD-10-CM | POA: Diagnosis not present

## 2018-04-23 DIAGNOSIS — I5022 Chronic systolic (congestive) heart failure: Secondary | ICD-10-CM | POA: Diagnosis not present

## 2018-04-23 DIAGNOSIS — F0150 Vascular dementia without behavioral disturbance: Secondary | ICD-10-CM | POA: Diagnosis not present

## 2018-04-23 DIAGNOSIS — F39 Unspecified mood [affective] disorder: Secondary | ICD-10-CM | POA: Diagnosis not present

## 2018-04-23 DIAGNOSIS — E785 Hyperlipidemia, unspecified: Secondary | ICD-10-CM | POA: Diagnosis not present

## 2018-04-23 DIAGNOSIS — N183 Chronic kidney disease, stage 3 (moderate): Secondary | ICD-10-CM | POA: Diagnosis not present

## 2018-04-23 DIAGNOSIS — I130 Hypertensive heart and chronic kidney disease with heart failure and stage 1 through stage 4 chronic kidney disease, or unspecified chronic kidney disease: Secondary | ICD-10-CM | POA: Diagnosis not present

## 2018-04-24 DIAGNOSIS — F0150 Vascular dementia without behavioral disturbance: Secondary | ICD-10-CM | POA: Diagnosis not present

## 2018-04-24 DIAGNOSIS — I130 Hypertensive heart and chronic kidney disease with heart failure and stage 1 through stage 4 chronic kidney disease, or unspecified chronic kidney disease: Secondary | ICD-10-CM | POA: Diagnosis not present

## 2018-04-24 DIAGNOSIS — I5022 Chronic systolic (congestive) heart failure: Secondary | ICD-10-CM | POA: Diagnosis not present

## 2018-04-24 DIAGNOSIS — E785 Hyperlipidemia, unspecified: Secondary | ICD-10-CM | POA: Diagnosis not present

## 2018-04-24 DIAGNOSIS — F39 Unspecified mood [affective] disorder: Secondary | ICD-10-CM | POA: Diagnosis not present

## 2018-04-24 DIAGNOSIS — N183 Chronic kidney disease, stage 3 (moderate): Secondary | ICD-10-CM | POA: Diagnosis not present

## 2018-04-25 DIAGNOSIS — F0150 Vascular dementia without behavioral disturbance: Secondary | ICD-10-CM | POA: Diagnosis not present

## 2018-04-25 DIAGNOSIS — I130 Hypertensive heart and chronic kidney disease with heart failure and stage 1 through stage 4 chronic kidney disease, or unspecified chronic kidney disease: Secondary | ICD-10-CM | POA: Diagnosis not present

## 2018-04-25 DIAGNOSIS — E785 Hyperlipidemia, unspecified: Secondary | ICD-10-CM | POA: Diagnosis not present

## 2018-04-25 DIAGNOSIS — F39 Unspecified mood [affective] disorder: Secondary | ICD-10-CM | POA: Diagnosis not present

## 2018-04-25 DIAGNOSIS — I5022 Chronic systolic (congestive) heart failure: Secondary | ICD-10-CM | POA: Diagnosis not present

## 2018-04-25 DIAGNOSIS — N183 Chronic kidney disease, stage 3 (moderate): Secondary | ICD-10-CM | POA: Diagnosis not present

## 2018-04-26 DIAGNOSIS — F0150 Vascular dementia without behavioral disturbance: Secondary | ICD-10-CM | POA: Diagnosis not present

## 2018-04-26 DIAGNOSIS — E785 Hyperlipidemia, unspecified: Secondary | ICD-10-CM | POA: Diagnosis not present

## 2018-04-26 DIAGNOSIS — I5022 Chronic systolic (congestive) heart failure: Secondary | ICD-10-CM | POA: Diagnosis not present

## 2018-04-26 DIAGNOSIS — I130 Hypertensive heart and chronic kidney disease with heart failure and stage 1 through stage 4 chronic kidney disease, or unspecified chronic kidney disease: Secondary | ICD-10-CM | POA: Diagnosis not present

## 2018-04-26 DIAGNOSIS — N183 Chronic kidney disease, stage 3 (moderate): Secondary | ICD-10-CM | POA: Diagnosis not present

## 2018-04-26 DIAGNOSIS — F39 Unspecified mood [affective] disorder: Secondary | ICD-10-CM | POA: Diagnosis not present

## 2018-04-27 DIAGNOSIS — N183 Chronic kidney disease, stage 3 (moderate): Secondary | ICD-10-CM | POA: Diagnosis not present

## 2018-04-27 DIAGNOSIS — F39 Unspecified mood [affective] disorder: Secondary | ICD-10-CM | POA: Diagnosis not present

## 2018-04-27 DIAGNOSIS — I130 Hypertensive heart and chronic kidney disease with heart failure and stage 1 through stage 4 chronic kidney disease, or unspecified chronic kidney disease: Secondary | ICD-10-CM | POA: Diagnosis not present

## 2018-04-27 DIAGNOSIS — E785 Hyperlipidemia, unspecified: Secondary | ICD-10-CM | POA: Diagnosis not present

## 2018-04-27 DIAGNOSIS — F0150 Vascular dementia without behavioral disturbance: Secondary | ICD-10-CM | POA: Diagnosis not present

## 2018-04-27 DIAGNOSIS — I5022 Chronic systolic (congestive) heart failure: Secondary | ICD-10-CM | POA: Diagnosis not present

## 2018-04-28 DIAGNOSIS — F39 Unspecified mood [affective] disorder: Secondary | ICD-10-CM | POA: Diagnosis not present

## 2018-04-28 DIAGNOSIS — I5022 Chronic systolic (congestive) heart failure: Secondary | ICD-10-CM | POA: Diagnosis not present

## 2018-04-28 DIAGNOSIS — F0150 Vascular dementia without behavioral disturbance: Secondary | ICD-10-CM | POA: Diagnosis not present

## 2018-04-28 DIAGNOSIS — N183 Chronic kidney disease, stage 3 (moderate): Secondary | ICD-10-CM | POA: Diagnosis not present

## 2018-04-28 DIAGNOSIS — I130 Hypertensive heart and chronic kidney disease with heart failure and stage 1 through stage 4 chronic kidney disease, or unspecified chronic kidney disease: Secondary | ICD-10-CM | POA: Diagnosis not present

## 2018-04-28 DIAGNOSIS — E785 Hyperlipidemia, unspecified: Secondary | ICD-10-CM | POA: Diagnosis not present

## 2018-04-29 DIAGNOSIS — E785 Hyperlipidemia, unspecified: Secondary | ICD-10-CM | POA: Diagnosis not present

## 2018-04-29 DIAGNOSIS — I130 Hypertensive heart and chronic kidney disease with heart failure and stage 1 through stage 4 chronic kidney disease, or unspecified chronic kidney disease: Secondary | ICD-10-CM | POA: Diagnosis not present

## 2018-04-29 DIAGNOSIS — F39 Unspecified mood [affective] disorder: Secondary | ICD-10-CM | POA: Diagnosis not present

## 2018-04-29 DIAGNOSIS — F0150 Vascular dementia without behavioral disturbance: Secondary | ICD-10-CM | POA: Diagnosis not present

## 2018-04-29 DIAGNOSIS — N183 Chronic kidney disease, stage 3 (moderate): Secondary | ICD-10-CM | POA: Diagnosis not present

## 2018-04-29 DIAGNOSIS — I5022 Chronic systolic (congestive) heart failure: Secondary | ICD-10-CM | POA: Diagnosis not present

## 2018-04-30 DIAGNOSIS — N183 Chronic kidney disease, stage 3 (moderate): Secondary | ICD-10-CM | POA: Diagnosis not present

## 2018-04-30 DIAGNOSIS — F0150 Vascular dementia without behavioral disturbance: Secondary | ICD-10-CM | POA: Diagnosis not present

## 2018-04-30 DIAGNOSIS — E785 Hyperlipidemia, unspecified: Secondary | ICD-10-CM | POA: Diagnosis not present

## 2018-04-30 DIAGNOSIS — F39 Unspecified mood [affective] disorder: Secondary | ICD-10-CM | POA: Diagnosis not present

## 2018-04-30 DIAGNOSIS — I5022 Chronic systolic (congestive) heart failure: Secondary | ICD-10-CM | POA: Diagnosis not present

## 2018-04-30 DIAGNOSIS — I130 Hypertensive heart and chronic kidney disease with heart failure and stage 1 through stage 4 chronic kidney disease, or unspecified chronic kidney disease: Secondary | ICD-10-CM | POA: Diagnosis not present

## 2018-05-01 DIAGNOSIS — F39 Unspecified mood [affective] disorder: Secondary | ICD-10-CM | POA: Diagnosis not present

## 2018-05-01 DIAGNOSIS — I130 Hypertensive heart and chronic kidney disease with heart failure and stage 1 through stage 4 chronic kidney disease, or unspecified chronic kidney disease: Secondary | ICD-10-CM | POA: Diagnosis not present

## 2018-05-01 DIAGNOSIS — I5022 Chronic systolic (congestive) heart failure: Secondary | ICD-10-CM | POA: Diagnosis not present

## 2018-05-01 DIAGNOSIS — N183 Chronic kidney disease, stage 3 (moderate): Secondary | ICD-10-CM | POA: Diagnosis not present

## 2018-05-01 DIAGNOSIS — F0150 Vascular dementia without behavioral disturbance: Secondary | ICD-10-CM | POA: Diagnosis not present

## 2018-05-01 DIAGNOSIS — E785 Hyperlipidemia, unspecified: Secondary | ICD-10-CM | POA: Diagnosis not present

## 2018-05-02 DIAGNOSIS — I5022 Chronic systolic (congestive) heart failure: Secondary | ICD-10-CM | POA: Diagnosis not present

## 2018-05-02 DIAGNOSIS — E785 Hyperlipidemia, unspecified: Secondary | ICD-10-CM | POA: Diagnosis not present

## 2018-05-02 DIAGNOSIS — N183 Chronic kidney disease, stage 3 (moderate): Secondary | ICD-10-CM | POA: Diagnosis not present

## 2018-05-02 DIAGNOSIS — F0150 Vascular dementia without behavioral disturbance: Secondary | ICD-10-CM | POA: Diagnosis not present

## 2018-05-02 DIAGNOSIS — I130 Hypertensive heart and chronic kidney disease with heart failure and stage 1 through stage 4 chronic kidney disease, or unspecified chronic kidney disease: Secondary | ICD-10-CM | POA: Diagnosis not present

## 2018-05-02 DIAGNOSIS — F39 Unspecified mood [affective] disorder: Secondary | ICD-10-CM | POA: Diagnosis not present

## 2018-05-05 DIAGNOSIS — I5022 Chronic systolic (congestive) heart failure: Secondary | ICD-10-CM | POA: Diagnosis not present

## 2018-05-05 DIAGNOSIS — N183 Chronic kidney disease, stage 3 (moderate): Secondary | ICD-10-CM | POA: Diagnosis not present

## 2018-05-05 DIAGNOSIS — F0150 Vascular dementia without behavioral disturbance: Secondary | ICD-10-CM | POA: Diagnosis not present

## 2018-05-05 DIAGNOSIS — I130 Hypertensive heart and chronic kidney disease with heart failure and stage 1 through stage 4 chronic kidney disease, or unspecified chronic kidney disease: Secondary | ICD-10-CM | POA: Diagnosis not present

## 2018-05-05 DIAGNOSIS — E785 Hyperlipidemia, unspecified: Secondary | ICD-10-CM | POA: Diagnosis not present

## 2018-05-05 DIAGNOSIS — F39 Unspecified mood [affective] disorder: Secondary | ICD-10-CM | POA: Diagnosis not present

## 2018-05-06 DIAGNOSIS — F0150 Vascular dementia without behavioral disturbance: Secondary | ICD-10-CM | POA: Diagnosis not present

## 2018-05-06 DIAGNOSIS — F39 Unspecified mood [affective] disorder: Secondary | ICD-10-CM | POA: Diagnosis not present

## 2018-05-06 DIAGNOSIS — N183 Chronic kidney disease, stage 3 (moderate): Secondary | ICD-10-CM | POA: Diagnosis not present

## 2018-05-06 DIAGNOSIS — I130 Hypertensive heart and chronic kidney disease with heart failure and stage 1 through stage 4 chronic kidney disease, or unspecified chronic kidney disease: Secondary | ICD-10-CM | POA: Diagnosis not present

## 2018-05-06 DIAGNOSIS — I5022 Chronic systolic (congestive) heart failure: Secondary | ICD-10-CM | POA: Diagnosis not present

## 2018-05-06 DIAGNOSIS — E785 Hyperlipidemia, unspecified: Secondary | ICD-10-CM | POA: Diagnosis not present

## 2018-05-07 DIAGNOSIS — F0150 Vascular dementia without behavioral disturbance: Secondary | ICD-10-CM | POA: Diagnosis not present

## 2018-05-07 DIAGNOSIS — N183 Chronic kidney disease, stage 3 (moderate): Secondary | ICD-10-CM | POA: Diagnosis not present

## 2018-05-07 DIAGNOSIS — I130 Hypertensive heart and chronic kidney disease with heart failure and stage 1 through stage 4 chronic kidney disease, or unspecified chronic kidney disease: Secondary | ICD-10-CM | POA: Diagnosis not present

## 2018-05-07 DIAGNOSIS — F39 Unspecified mood [affective] disorder: Secondary | ICD-10-CM | POA: Diagnosis not present

## 2018-05-07 DIAGNOSIS — I5022 Chronic systolic (congestive) heart failure: Secondary | ICD-10-CM | POA: Diagnosis not present

## 2018-05-07 DIAGNOSIS — E785 Hyperlipidemia, unspecified: Secondary | ICD-10-CM | POA: Diagnosis not present

## 2018-05-09 DEATH — deceased
